# Patient Record
Sex: Female | Born: 1960 | Race: Asian | Hispanic: No | Smoking: Never smoker
Health system: Southern US, Community
[De-identification: ages and names within clinical notes are randomized; demographics above are authoritative.]

## PROBLEM LIST (undated history)

## (undated) DIAGNOSIS — A879 Viral meningitis, unspecified: Secondary | ICD-10-CM

## (undated) DIAGNOSIS — M810 Age-related osteoporosis without current pathological fracture: Secondary | ICD-10-CM

## (undated) DIAGNOSIS — M1990 Unspecified osteoarthritis, unspecified site: Secondary | ICD-10-CM

## (undated) DIAGNOSIS — I10 Essential (primary) hypertension: Secondary | ICD-10-CM

## (undated) DIAGNOSIS — G4733 Obstructive sleep apnea (adult) (pediatric): Secondary | ICD-10-CM

## (undated) DIAGNOSIS — L089 Local infection of the skin and subcutaneous tissue, unspecified: Secondary | ICD-10-CM

## (undated) DIAGNOSIS — G4730 Sleep apnea, unspecified: Secondary | ICD-10-CM

## (undated) DIAGNOSIS — T7840XA Allergy, unspecified, initial encounter: Secondary | ICD-10-CM

## (undated) DIAGNOSIS — H47019 Ischemic optic neuropathy, unspecified eye: Secondary | ICD-10-CM

## (undated) DIAGNOSIS — B019 Varicella without complication: Secondary | ICD-10-CM

## (undated) DIAGNOSIS — E785 Hyperlipidemia, unspecified: Secondary | ICD-10-CM

## (undated) DIAGNOSIS — M5431 Sciatica, right side: Secondary | ICD-10-CM

## (undated) DIAGNOSIS — R011 Cardiac murmur, unspecified: Secondary | ICD-10-CM

## (undated) DIAGNOSIS — I319 Disease of pericardium, unspecified: Secondary | ICD-10-CM

## (undated) DIAGNOSIS — K219 Gastro-esophageal reflux disease without esophagitis: Secondary | ICD-10-CM

## (undated) HISTORY — DX: Viral meningitis, unspecified: A87.9

## (undated) HISTORY — DX: Unspecified osteoarthritis, unspecified site: M19.90

## (undated) HISTORY — DX: Obstructive sleep apnea (adult) (pediatric): G47.33

## (undated) HISTORY — PX: COLONOSCOPY: SHX174

## (undated) HISTORY — PX: POLYPECTOMY: SHX149

## (undated) HISTORY — DX: Hyperlipidemia, unspecified: E78.5

## (undated) HISTORY — DX: Allergy, unspecified, initial encounter: T78.40XA

## (undated) HISTORY — DX: Age-related osteoporosis without current pathological fracture: M81.0

## (undated) HISTORY — DX: Varicella without complication: B01.9

## (undated) HISTORY — DX: Local infection of the skin and subcutaneous tissue, unspecified: L08.9

## (undated) HISTORY — DX: Sleep apnea, unspecified: G47.30

## (undated) HISTORY — DX: Disease of pericardium, unspecified: I31.9

## (undated) HISTORY — DX: Ischemic optic neuropathy, unspecified eye: H47.019

## (undated) HISTORY — DX: Sciatica, right side: M54.31

## (undated) HISTORY — DX: Cardiac murmur, unspecified: R01.1

## (undated) HISTORY — DX: Essential (primary) hypertension: I10

## (undated) HISTORY — DX: Gastro-esophageal reflux disease without esophagitis: K21.9

---

## 2006-06-14 DIAGNOSIS — L089 Local infection of the skin and subcutaneous tissue, unspecified: Secondary | ICD-10-CM

## 2006-06-14 HISTORY — DX: Local infection of the skin and subcutaneous tissue, unspecified: L08.9

## 2007-12-25 ENCOUNTER — Emergency Department (HOSPITAL_COMMUNITY): Admission: EM | Admit: 2007-12-25 | Discharge: 2007-12-25 | Payer: Self-pay | Admitting: Emergency Medicine

## 2008-06-14 HISTORY — PX: KNEE ARTHROSCOPY: SUR90

## 2008-06-25 ENCOUNTER — Other Ambulatory Visit: Admission: RE | Admit: 2008-06-25 | Discharge: 2008-06-25 | Payer: Self-pay | Admitting: Family Medicine

## 2008-11-25 ENCOUNTER — Emergency Department (HOSPITAL_COMMUNITY): Admission: EM | Admit: 2008-11-25 | Discharge: 2008-11-25 | Payer: Self-pay | Admitting: Emergency Medicine

## 2009-02-21 ENCOUNTER — Encounter: Admission: RE | Admit: 2009-02-21 | Discharge: 2009-02-21 | Payer: Self-pay | Admitting: Family Medicine

## 2009-06-25 ENCOUNTER — Emergency Department (HOSPITAL_COMMUNITY): Admission: EM | Admit: 2009-06-25 | Discharge: 2009-06-26 | Payer: Self-pay | Admitting: Emergency Medicine

## 2009-10-21 ENCOUNTER — Encounter: Admission: RE | Admit: 2009-10-21 | Discharge: 2009-10-21 | Payer: Self-pay | Admitting: Obstetrics and Gynecology

## 2010-03-22 ENCOUNTER — Emergency Department (HOSPITAL_COMMUNITY): Admission: EM | Admit: 2010-03-22 | Discharge: 2010-03-22 | Payer: Self-pay | Admitting: Emergency Medicine

## 2010-05-01 ENCOUNTER — Encounter: Admission: RE | Admit: 2010-05-01 | Discharge: 2010-05-01 | Payer: Self-pay | Admitting: Obstetrics and Gynecology

## 2010-06-14 HISTORY — PX: CARDIAC CATHETERIZATION: SHX172

## 2010-07-04 ENCOUNTER — Encounter: Payer: Self-pay | Admitting: Obstetrics and Gynecology

## 2010-08-04 ENCOUNTER — Emergency Department (HOSPITAL_COMMUNITY)
Admission: EM | Admit: 2010-08-04 | Discharge: 2010-08-04 | Disposition: A | Discharge status: Home / Self Care | Payer: 59 | Attending: Emergency Medicine | Admitting: Emergency Medicine

## 2010-08-04 ENCOUNTER — Emergency Department (HOSPITAL_COMMUNITY): Payer: 59

## 2010-08-04 DIAGNOSIS — E785 Hyperlipidemia, unspecified: Secondary | ICD-10-CM | POA: Insufficient documentation

## 2010-08-04 DIAGNOSIS — J45909 Unspecified asthma, uncomplicated: Secondary | ICD-10-CM | POA: Insufficient documentation

## 2010-08-04 DIAGNOSIS — R0789 Other chest pain: Secondary | ICD-10-CM | POA: Insufficient documentation

## 2010-08-04 DIAGNOSIS — I10 Essential (primary) hypertension: Secondary | ICD-10-CM | POA: Insufficient documentation

## 2010-08-04 DIAGNOSIS — M436 Torticollis: Secondary | ICD-10-CM | POA: Insufficient documentation

## 2010-08-04 DIAGNOSIS — R070 Pain in throat: Secondary | ICD-10-CM | POA: Insufficient documentation

## 2010-08-04 DIAGNOSIS — R51 Headache: Secondary | ICD-10-CM | POA: Insufficient documentation

## 2010-08-04 DIAGNOSIS — K089 Disorder of teeth and supporting structures, unspecified: Secondary | ICD-10-CM | POA: Insufficient documentation

## 2010-08-04 DIAGNOSIS — J329 Chronic sinusitis, unspecified: Secondary | ICD-10-CM | POA: Insufficient documentation

## 2010-08-04 DIAGNOSIS — H9209 Otalgia, unspecified ear: Secondary | ICD-10-CM | POA: Insufficient documentation

## 2010-08-04 DIAGNOSIS — E119 Type 2 diabetes mellitus without complications: Secondary | ICD-10-CM | POA: Insufficient documentation

## 2010-08-04 LAB — POCT CARDIAC MARKERS
CKMB, poc: <1 ng/mL — ABNORMAL LOW (ref 1.0–8.0)
Myoglobin, poc: 54.3 ng/mL (ref 12–200)
Troponin i, poc: <0.05 ng/mL (ref 0.00–0.09)

## 2010-08-04 LAB — COMPREHENSIVE METABOLIC PANEL
ALT: 20 U/L (ref 0–35)
AST: 19 U/L (ref 0–37)
Albumin: 4.2 g/dL (ref 3.5–5.2)
Alkaline Phosphatase: 85 U/L (ref 39–117)
BUN: 10 mg/dL (ref 6–23)
CO2: 26 mEq/L (ref 19–32)
Calcium: 10.1 mg/dL (ref 8.4–10.5)
Chloride: 98 mEq/L (ref 96–112)
Creatinine, Ser: 0.67 mg/dL (ref 0.4–1.2)
GFR calc Af Amer: >60 mL/min (ref >60)
GFR calc non Af Amer: >60 mL/min (ref >60)
Glucose, Bld: 149 mg/dL — ABNORMAL HIGH (ref 70–99)
Potassium: 4.1 mEq/L (ref 3.5–5.1)
Sodium: 137 mEq/L (ref 135–145)
Total Bilirubin: 0.7 mg/dL (ref 0.3–1.2)
Total Protein: 8.5 g/dL — ABNORMAL HIGH (ref 6.0–8.3)

## 2010-08-04 LAB — DIFFERENTIAL
Basophils Absolute: 0.1 10*3/uL (ref 0.0–0.1)
Basophils Relative: 1 % (ref 0–1)
Eosinophils Absolute: 0.1 10*3/uL (ref 0.0–0.7)
Eosinophils Relative: 2 % (ref 0–5)
Lymphocytes Relative: 40 % (ref 12–46)
Lymphs Abs: 3.5 10*3/uL (ref 0.7–4.0)
Monocytes Absolute: 0.5 10*3/uL (ref 0.1–1.0)
Monocytes Relative: 6 % (ref 3–12)
Neutro Abs: 4.5 10*3/uL (ref 1.7–7.7)
Neutrophils Relative %: 52 % (ref 43–77)

## 2010-08-04 LAB — CBC
HCT: 43.6 % (ref 36.0–46.0)
Hemoglobin: 15.3 g/dL — ABNORMAL HIGH (ref 12.0–15.0)
MCH: 29.5 pg (ref 26.0–34.0)
MCHC: 35.1 g/dL (ref 30.0–36.0)
MCV: 84 fL (ref 78.0–100.0)
Platelets: 442 10*3/uL — ABNORMAL HIGH (ref 150–400)
RBC: 5.19 MIL/uL — ABNORMAL HIGH (ref 3.87–5.11)
RDW: 12.5 % (ref 11.5–15.5)
WBC: 8.7 10*3/uL (ref 4.0–10.5)

## 2010-08-20 ENCOUNTER — Ambulatory Visit (HOSPITAL_COMMUNITY)
Admission: EM | Admit: 2010-08-20 | Discharge: 2010-08-20 | Disposition: A | Discharge status: Home / Self Care | Payer: 59 | Attending: Interventional Cardiology | Admitting: Interventional Cardiology

## 2010-08-20 DIAGNOSIS — G43909 Migraine, unspecified, not intractable, without status migrainosus: Secondary | ICD-10-CM | POA: Insufficient documentation

## 2010-08-20 DIAGNOSIS — I10 Essential (primary) hypertension: Secondary | ICD-10-CM | POA: Insufficient documentation

## 2010-08-20 DIAGNOSIS — R079 Chest pain, unspecified: Secondary | ICD-10-CM | POA: Insufficient documentation

## 2010-08-20 DIAGNOSIS — M129 Arthropathy, unspecified: Secondary | ICD-10-CM | POA: Insufficient documentation

## 2010-08-20 DIAGNOSIS — E119 Type 2 diabetes mellitus without complications: Secondary | ICD-10-CM | POA: Insufficient documentation

## 2010-08-20 DIAGNOSIS — E785 Hyperlipidemia, unspecified: Secondary | ICD-10-CM | POA: Insufficient documentation

## 2010-08-20 DIAGNOSIS — J45909 Unspecified asthma, uncomplicated: Secondary | ICD-10-CM | POA: Insufficient documentation

## 2010-08-20 LAB — HEPATIC FUNCTION PANEL
ALT: 20 U/L (ref 0–35)
AST: 23 U/L (ref 0–37)
Albumin: 3.5 g/dL (ref 3.5–5.2)
Alkaline Phosphatase: 69 U/L (ref 39–117)
Bilirubin, Direct: 0.3 mg/dL (ref 0.0–0.3)
Indirect Bilirubin: 1 mg/dL — ABNORMAL HIGH (ref 0.3–0.9)
Total Bilirubin: 1.3 mg/dL — ABNORMAL HIGH (ref 0.3–1.2)
Total Protein: 7.1 g/dL (ref 6.0–8.3)

## 2010-08-20 LAB — DIFFERENTIAL
Basophils Absolute: 0.1 10*3/uL (ref 0.0–0.1)
Basophils Relative: 1 % (ref 0–1)
Eosinophils Absolute: 0.2 10*3/uL (ref 0.0–0.7)
Eosinophils Relative: 2 % (ref 0–5)
Lymphocytes Relative: 46 % (ref 12–46)
Lymphs Abs: 4 10*3/uL (ref 0.7–4.0)
Monocytes Absolute: 0.5 10*3/uL (ref 0.1–1.0)
Monocytes Relative: 5 % (ref 3–12)
Neutro Abs: 4 10*3/uL (ref 1.7–7.7)
Neutrophils Relative %: 46 % (ref 43–77)

## 2010-08-20 LAB — BASIC METABOLIC PANEL
BUN: 9 mg/dL (ref 6–23)
CO2: 24 mEq/L (ref 19–32)
Calcium: 9.6 mg/dL (ref 8.4–10.5)
Chloride: 104 mEq/L (ref 96–112)
Creatinine, Ser: 0.54 mg/dL (ref 0.4–1.2)
GFR calc Af Amer: >60 mL/min (ref >60)
GFR calc non Af Amer: >60 mL/min (ref >60)
Glucose, Bld: 162 mg/dL — ABNORMAL HIGH (ref 70–99)
Potassium: 4.4 mEq/L (ref 3.5–5.1)
Sodium: 139 mEq/L (ref 135–145)

## 2010-08-20 LAB — CBC
HCT: 39.4 % (ref 36.0–46.0)
Hemoglobin: 13.4 g/dL (ref 12.0–15.0)
MCH: 28.9 pg (ref 26.0–34.0)
MCHC: 34 g/dL (ref 30.0–36.0)
MCV: 85.1 fL (ref 78.0–100.0)
Platelets: 376 10*3/uL (ref 150–400)
RBC: 4.63 MIL/uL (ref 3.87–5.11)
RDW: 12.6 % (ref 11.5–15.5)
WBC: 8.8 10*3/uL (ref 4.0–10.5)

## 2010-08-20 LAB — CK TOTAL AND CKMB (NOT AT ARMC)
CK, MB: 1.1 ng/mL (ref 0.3–4.0)
Relative Index: INVALID (ref 0.0–2.5)
Total CK: 72 U/L (ref 7–177)

## 2010-08-20 LAB — TROPONIN I: Troponin I: 0.06 ng/mL (ref 0.00–0.06)

## 2010-08-20 LAB — TSH: TSH: 1.249 u[IU]/mL (ref 0.350–4.500)

## 2010-08-20 LAB — GLUCOSE, CAPILLARY: Glucose-Capillary: 104 mg/dL — ABNORMAL HIGH (ref 70–99)

## 2010-08-21 LAB — POCT ACTIVATED CLOTTING TIME: Activated Clotting Time: 175 seconds

## 2010-08-21 NOTE — H&P (Signed)
NAME:  Renee Lam, Renee Lam              ACCOUNT NO.:  [id number]  MEDICAL RECORD NO.:  [id number]           PATIENT TYPE:  I  LOCATION:  6522                         FACILITY:  [facility]  PHYSICIAN:  Armanda Magic, M.D.     DATE OF BIRTH:  [birth year]  DATE OF ADMISSION:  08/20/2010 DATE OF DISCHARGE:  08/20/2010                             HISTORY & PHYSICAL   CHIEF COMPLAINT:  Chest pain.  HISTORY OF PRESENT ILLNESS:  This is a 50 year old female who presented today in our office for a nuclear stress test.  She walked on the treadmill under the standard Bruce protocol for a total of 7 minutes and achieved 10 METS.  She developed chest pain about 6 minutes into the stress test with radiation to the neck and jaw.  EKG during the stress test did not show any significant ST changes, but in recovery she developed downsloping ST-segment with T-wave inversions in the inferior and lateral leads with persistence of her neck pain.  Currently, she is complaining of neck pain that is now moving into her back, between her scapula.  PAST MEDICAL HISTORY:  Type 2 diabetes mellitus, elevated cholesterol, hypertension, asthma, allergic rhinitis, migraines, vitamin D deficiency, and pneumonia in 2011.  CURRENT MEDICATIONS: 1. Ventolin HFA 108 mcg per ACT, aerosol spray, 1 puff every 4 hours     p.r.n. 2. Advair Diskus 250/50 mcg 1 puff p.r.n. 3. Singulair 10 mg in the evening as needed. 4. Lipitor 20 mg daily. 5. EpiPen p.r.n. 6. ReliOn 70/30% suspension, 40 units q.a.m. and 30 units q.p.m.     b.i.d. 7. Metformin 1000 mg b.i.d. 8. Aleve 220 mg as needed.  SURGICAL HISTORY:  She is status post C-section x2.  ALLERGIES:  PNEUMOCOCCAL VACCINE, ZITHROMAX, and PENICILLIN.  FAMILY HISTORY:  Her father is alive with type 2 diabetes mellitus and CAD.  Her mother is alive.  She has one brother with psoriasis and one sister who died of a medical illness.  SOCIAL HISTORY:  She denies any tobacco use.   She denies any alcohol use.  She does drink one serving of caffeine daily.  She denies any IV drug use.  She is divorced.  She has two children.  REVIEW OF SYSTEMS:  Other than what is stated in HPI, is negative.  PHYSICAL EXAMINATION:  GENERAL:  This is a well-developed, well- nourished female in no significant distress, although she has discomfort in her neck and back. HEENT:  Benign. NECK:  Supple without lymphadenopathy.  Carotid upstrokes +2 without any bruits. LUNGS:  Clear to auscultation throughout. HEART:  Regular rate and rhythm.  No murmurs, rubs, or gallops.  Normal S1 and S2. ABDOMEN:  Soft, nontender, nondistended.  Normoactive bowel sounds.  No hepatosplenomegaly. EXTREMITIES:  No cyanosis, erythema, or edema.  ASSESSMENT:  Chest pain after nuclear stress test, now with EKG changes consistent with ischemia and ongoing neck and back pain which she developed during the stress test.  Given the fact that patient continues to have tightness in her chest, it is now moving into her back and scapula with persistent ST changes on EKG, I have discussed  with Dr. Katrinka Blazing who ordered the stress test, and we will plan on transferring the patient to [facility] emergency room.  We will start the patient on IV heparin and nitroglycerin drips.  I have given the patient four baby aspirin in our office, and she will go to the cath lab today per Dr. Verdis Prime.     Armanda Magic, M.D.     TT/MEDQ  D:  08/20/2010  T:  08/21/2010  Job:  [id number]  cc:   Johnn Hai  Electronically Signed by Armanda Magic M.D. on 08/21/2010 13:24:40 PM

## 2010-09-21 LAB — BASIC METABOLIC PANEL
BUN: 8 mg/dL (ref 6–23)
CO2: 25 mEq/L (ref 19–32)
Calcium: 9.4 mg/dL (ref 8.4–10.5)
Chloride: 99 mEq/L (ref 96–112)
Creatinine, Ser: 0.58 mg/dL (ref 0.4–1.2)
GFR calc Af Amer: >60 mL/min (ref >60)
GFR calc non Af Amer: >60 mL/min (ref >60)
Glucose, Bld: 142 mg/dL — ABNORMAL HIGH (ref 70–99)
Potassium: 4.1 mEq/L (ref 3.5–5.1)
Sodium: 137 mEq/L (ref 135–145)

## 2010-09-21 LAB — CBC
HCT: 41 % (ref 36.0–46.0)
Hemoglobin: 13.7 g/dL (ref 12.0–15.0)
MCHC: 33.5 g/dL (ref 30.0–36.0)
MCV: 85 fL (ref 78.0–100.0)
Platelets: 471 10*3/uL — ABNORMAL HIGH (ref 150–400)
RBC: 4.83 MIL/uL (ref 3.87–5.11)
RDW: 13.1 % (ref 11.5–15.5)
WBC: 10 10*3/uL (ref 4.0–10.5)

## 2010-09-21 LAB — MONONUCLEOSIS SCREEN: Mono Screen: NEGATIVE

## 2010-09-21 LAB — DIFFERENTIAL
Basophils Absolute: 0.1 10*3/uL (ref 0.0–0.1)
Basophils Relative: 1 % (ref 0–1)
Eosinophils Absolute: 0.1 10*3/uL (ref 0.0–0.7)
Eosinophils Relative: 1 % (ref 0–5)
Lymphocytes Relative: 30 % (ref 12–46)
Lymphs Abs: 3 10*3/uL (ref 0.7–4.0)
Monocytes Absolute: 0.6 10*3/uL (ref 0.1–1.0)
Monocytes Relative: 6 % (ref 3–12)
Neutro Abs: 6.2 10*3/uL (ref 1.7–7.7)
Neutrophils Relative %: 62 % (ref 43–77)

## 2010-09-21 LAB — URINALYSIS, ROUTINE W REFLEX MICROSCOPIC
Bilirubin Urine: NEGATIVE
Glucose, UA: NEGATIVE mg/dL
Ketones, ur: NEGATIVE mg/dL
Leukocytes, UA: NEGATIVE
Nitrite: NEGATIVE
Protein, ur: NEGATIVE mg/dL
Specific Gravity, Urine: 1.009 (ref 1.005–1.030)
Urobilinogen, UA: 0.2 mg/dL (ref 0.0–1.0)
pH: 6 (ref 5.0–8.0)

## 2010-09-21 LAB — RAPID STREP SCREEN (MED CTR MEBANE ONLY): Streptococcus, Group A Screen (Direct): NEGATIVE

## 2010-09-21 LAB — URINE MICROSCOPIC-ADD ON

## 2010-10-02 ENCOUNTER — Other Ambulatory Visit: Payer: Self-pay | Admitting: Obstetrics and Gynecology

## 2010-10-02 DIAGNOSIS — Z09 Encounter for follow-up examination after completed treatment for conditions other than malignant neoplasm: Secondary | ICD-10-CM

## 2010-10-02 NOTE — Cardiovascular Report (Signed)
NAME:  Renee Lam, DISLA NO.:  [id number]  MEDICAL RECORD NO.:  [id number]           PATIENT TYPE:  I  LOCATION:  6522                         FACILITY:  [facility]  PHYSICIAN:  Lyn Records, M.D.   DATE OF BIRTH:  05-07-1961  DATE OF PROCEDURE:  08/20/2010 DATE OF DISCHARGE:  08/20/2010                           CARDIAC CATHETERIZATION   INDICATIONS:  For the past 2 weeks, the patient has experienced intermittent chest discomfort with an exertional component.  After hearing this story, we had the patient set to have a stress nuclear study which was performed today.  She experienced chest tightness and interscapular discomfort with ambulation on the treadmill.  There were no EKG changes of ischemia.  Postexercise, she developed diffuse ST- segment sagging with T-wave inversion.  Dr. Mayford Knife supervised the study and felt that the patient's findings were compatible with progressive angina/unstable angina.  She was sent to the emergency room and coronary angiography was recommended.  Laboratory data was all normal.  The patient had mild persisting discomfort upon arrival in the emergency room.  EKG was nonischemic.  PROCEDURE PERFORMED: 1. Left heart catheterization via right radial approach. 2. Coronary angiography. 3. Left ventriculography by hand injection.  DESCRIPTION:  The patient was given 2 mg of Versed and 100 mcg of fentanyl.  Once she was asleep, a micropuncture set was used to place a 5-French sheath in the right radial artery using the modified Seldinger technique.  No complications were occurred with sheath placement. Verapamil intra-arterially was administered to prevent spasm.  2500 units of heparin was administered.  Prior to heparin administration, the ACT was 178 seconds.  She received a 3500-unit bolus of heparin in the emergency room and was started on an infusion.  The infusion was discontinued prior to sticking the radial.  Left heart  catheterization was then performed with preformed Judkins right and left 5-French catheters.  The left coronary catheter was a 3.5- cm variety.  Left ventriculography was performed using the multipurpose catheter by hand injection.  No complications arose.  The sheath catheter was removed over a guidewire.  The sheath was removed by the clinical staff with good hemostasis and placement of a wristband.  No complications arose.  RESULTS: 1. Hemodynamic data:     a.     Aortic pressure 93/67.     b.     Left ventricular pressure 104/13. 2. Left ventriculography:  Left ventricular cavity size and function     are normal.  The ejection fraction is 65-70%.  No mitral     regurgitation. 3. Coronary angiography.     a.     Left main coronary artery:  The left main is short and is      widely patent.     b.     Left anterior descending coronary artery:  Large vessel that      is transapical and is widely patent.  Three diagonal branches are      widely patent.     c.     Circumflex artery:  The circumflex is dominant.  There is  deep intubation of the proximal circumflex with the diagnostic      catheter.  Care was taken to avoid vigorous injections to prevent      dissection.  Five obtuse marginal branches and the PDA arise from      the circumflex.  All are entirely normal.     d.     Right coronary artery:  The right coronary is nondominant.      The vessel is widely patent.  CONCLUSION: 1. Normal epicardial coronary arteries. 2. Normal left ventricular function. 3. False positive electrocardiographic response to exercise. 4. Chest pain felt to be noncardiac in origin.  PLAN:  Discharge later today.  I will check a D-dimer.  If she continues to complain, we may consider doing a pulmonary CT angio to rule out pulmonary emboli.     Lyn Records, M.D.     HWS/MEDQ  D:  08/20/2010  T:  08/21/2010  Job:  [id number]  cc:   Emeterio Reeve, MD  Electronically Signed by Verdis Prime M.D. on 10/02/2010 04:51:39 PM

## 2010-10-02 NOTE — Discharge Summary (Signed)
NAME:  Renee Lam, Renee Lam NO.:  [id number]  MEDICAL RECORD NO.:  [id number]           PATIENT TYPE:  I  LOCATION:  6522                         FACILITY:  [facility]  PHYSICIAN:  Lyn Records, M.D.   DATE OF BIRTH:  08-06-1960  DATE OF ADMISSION:  08/20/2010 DATE OF DISCHARGE:  08/20/2010                              DISCHARGE SUMMARY   REASON FOR ADMISSION:  Chest pain.  DISCHARGE DIAGNOSES: 1. Chest pain felt most likely to be due to musculoskeletal pain.     There is certainly no evidence of obstructive coronary disease. 2. Diabetes mellitus. 3. Hypertension. 4. Hyperlipidemia.  PROCEDURES PERFORMED:  Coronary angiography via right radial approach, Dr. Verdis Prime.  PLANS:  The patient will be discharged from the hospital on the medical regimen that she was admitted on which includes Ventolin HFA 108 mcg per puff every 4 hours as needed, Advair Diskus 250/50 mcg 1 puff daily, Singulair 10 mg each evening, Lipitor 20 mg daily, ReliOn insulin 70/30 40 units each a.m., 30 units each p.m., metformin 1000 mg twice daily, Aleve 220 mg daily as needed.  ACTIVITY:  Unrestricted.  FOLLOWUP:  Dr. Mila Palmer.  Should see Dr. Verdis Prime, if cath site related problems.  RESTRICTIONS:  Should not return to work until August 22, 2010.  HISTORY AND PHYSICAL AND HOSPITAL COURSE:  The patient has had a 2-week history of an upper respiratory illness.  During this time, she has experienced chest fullness and neck discomfort with radiation to her jaw.  She was seen in Cardiology consultation earlier this week and a nuclear study was ordered.  She did exercise testing today at our office which resulted in admission to the hospital.  She experienced chest discomfort and jaw discomfort while on the treadmill.  No ischemic EKG changes were noted.  Post recovery, there was diffuse T-wave inversion and ST-segment depression with progression of discomfort into the scapular  area.  Dr. Mayford Knife, who was a supervising physician in the office sent her to the emergency room and felt that she was having unstable angina and should have urgent catheterization because of ongoing pain.  The patient has no prior history of heart disease.  She has had episodes of chest pain requiring hospitalization on three prior occasions in 2006, 2007, and 2008.  She has previously refused cath.  She does not smoke.  Heart cath was performed urgently and was normal.  No coronary atherosclerosis was identified.  The patient had laboratory data done on admission and this included a hemoglobin of 13.4, white blood cell count of 8800, potassium of 4.4, creatinine of 0.54.  CK-MB and troponin were normal.  Liver enzymes were normal.  The direct bilirubin was 1.3 and indirect bilirubin 1.0.  EKGs were nonischemic on admission.  Chest x-ray was recently performed at [facility] on August 04, 2010, and did not reveal acute abnormality.  Upon discharge, the patient was in improved condition.     Lyn Records, M.D.  HWS/MEDQ  D:  08/20/2010  T:  08/21/2010  Job:  [id number]  cc:   Emeterio Reeve, MD  Electronically  Signed by Verdis Prime M.D. on 10/02/2010 04:51:45 PM

## 2010-10-27 ENCOUNTER — Ambulatory Visit
Admission: RE | Admit: 2010-10-27 | Discharge: 2010-10-27 | Disposition: A | Discharge status: Home / Self Care | Payer: 59 | Source: Ambulatory Visit | Attending: Obstetrics and Gynecology | Admitting: Obstetrics and Gynecology

## 2010-10-27 DIAGNOSIS — Z09 Encounter for follow-up examination after completed treatment for conditions other than malignant neoplasm: Secondary | ICD-10-CM

## 2011-04-19 ENCOUNTER — Emergency Department (HOSPITAL_COMMUNITY)
Admission: EM | Admit: 2011-04-19 | Discharge: 2011-04-19 | Disposition: A | Discharge status: Home / Self Care | Payer: 59 | Attending: Emergency Medicine | Admitting: Emergency Medicine

## 2011-04-19 ENCOUNTER — Encounter: Payer: Self-pay | Admitting: *Deleted

## 2011-04-19 DIAGNOSIS — R05 Cough: Secondary | ICD-10-CM | POA: Insufficient documentation

## 2011-04-19 DIAGNOSIS — E119 Type 2 diabetes mellitus without complications: Secondary | ICD-10-CM | POA: Insufficient documentation

## 2011-04-19 DIAGNOSIS — R059 Cough, unspecified: Secondary | ICD-10-CM | POA: Insufficient documentation

## 2011-04-19 DIAGNOSIS — R0789 Other chest pain: Secondary | ICD-10-CM | POA: Insufficient documentation

## 2011-04-19 DIAGNOSIS — J45901 Unspecified asthma with (acute) exacerbation: Secondary | ICD-10-CM

## 2011-04-19 MED ORDER — ALBUTEROL SULFATE HFA 108 (90 BASE) MCG/ACT IN AERS
2.0000 | INHALATION_SPRAY | Freq: Once | RESPIRATORY_TRACT | Status: AC
  Administered 2011-04-19: 2 via RESPIRATORY_TRACT
  Filled 2011-04-19: qty 6.7

## 2011-04-19 MED ORDER — ALBUTEROL SULFATE HFA 108 (90 BASE) MCG/ACT IN AERS: 2.0000 | INHALATION_SPRAY | RESPIRATORY_TRACT | Status: DC | PRN

## 2011-04-19 MED ORDER — ALBUTEROL SULFATE (5 MG/ML) 0.5% IN NEBU
5.0000 mg | INHALATION_SOLUTION | Freq: Once | RESPIRATORY_TRACT | Status: AC
  Administered 2011-04-19: 2.5 mg via RESPIRATORY_TRACT
  Filled 2011-04-19: qty 0.5

## 2011-04-19 NOTE — ED Provider Notes (Signed)
History     CSN: [id number] Arrival date & time: 04/19/2011  7:34 PM   None     Chief Complaint  Patient presents with  . Asthma    (Consider location/radiation/quality/duration/timing/severity/associated sxs/prior treatment) HPI Comments: Patient here with asthma attack after having left her inhaler at home this evening.  Patient is a 50 y.o. female presenting with asthma. The history is provided by the patient. No language interpreter was used.  Asthma This is a chronic problem. The current episode started today. The problem occurs constantly. Associated symptoms include coughing. Pertinent negatives include no abdominal pain, arthralgias, change in bowel habit, chest pain, congestion, fever, headaches, nausea, neck pain, numbness, rash, sore throat, swollen glands or vomiting. The symptoms are aggravated by nothing. She has tried nothing for the symptoms. The treatment provided no relief.    Past Medical History  Diagnosis Date  . Asthma   . Diabetes mellitus     No past surgical history on file.  No family history on file.  History  Substance Use Topics  . Smoking status: Never Smoker   . Smokeless tobacco: Not on file  . Alcohol Use: No    OB History    Grav Para Term Preterm Abortions TAB SAB Ect Mult Living                  Review of Systems  Constitutional: Negative.  Negative for fever.  HENT: Negative.  Negative for congestion, sore throat and neck pain.   Eyes: Negative.   Respiratory: Positive for cough and chest tightness.   Cardiovascular: Negative.  Negative for chest pain.  Gastrointestinal: Negative.  Negative for nausea, vomiting, abdominal pain and change in bowel habit.  Genitourinary: Negative.   Musculoskeletal: Negative.  Negative for arthralgias.  Skin: Negative for rash.  Neurological: Negative.  Negative for numbness and headaches.  Hematological: Negative.   Psychiatric/Behavioral: Negative.     Allergies  Penicillins and  Zithromax  Home Medications   Current Outpatient Rx  Name Route Sig Dispense Refill  . LIPITOR PO Oral Take by mouth.      Marland Kitchen NOVOLIN 70/30 [place] Subcutaneous Inject into the skin.      Marland Kitchen METFORMIN HCL PO Oral Take by mouth.      Marland Kitchen SINGULAIR PO Oral Take by mouth.        BP 143/95  Pulse 90  Temp(Src) 98 F (36.7 C) (Oral)  Resp 20  SpO2 100%  Physical Exam  Constitutional: She is oriented to person, place, and time. She appears well-developed and well-nourished.  HENT:  Head: Normocephalic and atraumatic.  Right Ear: External ear normal.  Left Ear: External ear normal.  Eyes: Pupils are equal, round, and reactive to light.  Neck: Normal range of motion. Neck supple.  Cardiovascular: Normal rate, regular rhythm and normal heart sounds.   Pulmonary/Chest: Effort normal. Not tachypneic. No respiratory distress. She has no decreased breath sounds. She has wheezes in the right upper field and the right middle field. She has no rales. She exhibits no tenderness.  Abdominal: Soft. There is no tenderness.  Musculoskeletal: Normal range of motion.  Neurological: She is alert and oriented to person, place, and time.  Skin: Skin is warm and dry.  Psychiatric: She has a normal mood and affect. Her behavior is normal. Judgment and thought content normal.    ED Course  Procedures (including critical care time)  Labs Reviewed - No data to display No results found.   Asthma Attack  MDM  Feels better after albuterol inhaler - will give inhaler to go home.        Izola Price [place], [place] 04/19/11 2106

## 2011-04-19 NOTE — ED Notes (Signed)
Pt in c/o asthma attack x20 min, pt with audible wheezing, pt did not have inhailer with her when attack started

## 2011-04-22 NOTE — ED Provider Notes (Signed)
Medical screening examination/treatment/procedure(s) were performed by non-physician practitioner and as supervising physician I was immediately available for consultation/collaboration.   Forbes Cellar, MD 04/22/11 [phone]

## 2011-06-18 ENCOUNTER — Ambulatory Visit (INDEPENDENT_AMBULATORY_CARE_PROVIDER_SITE_OTHER): Payer: 59 | Admitting: Internal Medicine

## 2011-06-18 ENCOUNTER — Encounter: Payer: Self-pay | Admitting: Internal Medicine

## 2011-06-18 VITALS — BP 130/90 | Temp 97.9°F | Ht 59.25 in | Wt 162.0 lb

## 2011-06-18 DIAGNOSIS — E7800 Pure hypercholesterolemia, unspecified: Secondary | ICD-10-CM

## 2011-06-18 DIAGNOSIS — E119 Type 2 diabetes mellitus without complications: Secondary | ICD-10-CM

## 2011-06-18 DIAGNOSIS — Z0000 Encounter for general adult medical examination without abnormal findings: Secondary | ICD-10-CM

## 2011-06-18 DIAGNOSIS — J45909 Unspecified asthma, uncomplicated: Secondary | ICD-10-CM | POA: Insufficient documentation

## 2011-06-18 MED ORDER — GLUCOSE BLOOD VI STRP: ORAL_STRIP | Status: DC

## 2011-06-18 MED ORDER — "INSULIN SYRINGE-NEEDLE U-100 31G X 5/16"" 1 ML MISC": Status: DC

## 2011-06-18 NOTE — Progress Notes (Signed)
  Subjective:    Patient ID: Renee Lam, female    DOB: 07/10/1960, 51 y.o.   MRN: [id number]  HPI  51 year old patient who is seen today to establish with our practice. She has long-standing diabetes controlled with mixed split insulin as well as metformin therapy. She was noted last year for chest pain and is status post catheterization revealed normal coronary arteries in March of last year. She has a history of dyslipidemia.  Past medical history:   medical problems include diabetes asthma history of gastro-soft reflux disease she has dyslipidemia and a history of migraines. She has a history of viral meningitis in 1982 and pericarditis in 1984. She is a gravida 4 para 2 abortus 3 she had C-sections in 1997 and 2000.  Family history father age 76 history of diabetes dyslipidemia prostate cancer hypertension he has coronary artery disease status post CABG he has also had a pacemaker insertion Mother died 55 history of congestive heart failure also had diabetes and peripheral vascular disease One brother has hypertension One sister who had a history of bipolar disorder complications from Stevens-Johnson syndrome  Social history Works with [employer]; divorced 2 children lifelong nonsmoker    Review of Systems  Constitutional: Negative for fever, appetite change, fatigue and unexpected weight change.  HENT: Negative for hearing loss, ear pain, nosebleeds, congestion, sore throat, mouth sores, trouble swallowing, neck stiffness, dental problem, voice change, sinus pressure and tinnitus.   Eyes: Negative for photophobia, pain, redness and visual disturbance.  Respiratory: Negative for cough, chest tightness and shortness of breath.   Cardiovascular: Negative for chest pain, palpitations and leg swelling.  Gastrointestinal: Negative for nausea, vomiting, abdominal pain, diarrhea, constipation, blood in stool, abdominal distention and rectal pain.  Genitourinary: Negative for dysuria,  urgency, frequency, hematuria, flank pain, vaginal bleeding, vaginal discharge, difficulty urinating, genital sores, vaginal pain, menstrual problem and pelvic pain.  Musculoskeletal: Negative for back pain and arthralgias.  Skin: Negative for rash.  Neurological: Negative for dizziness, syncope, speech difficulty, weakness, light-headedness, numbness and headaches.  Hematological: Negative for adenopathy. Does not bruise/bleed easily.  Psychiatric/Behavioral: Negative for suicidal ideas, behavioral problems, self-injury, dysphoric mood and agitation. The patient is not nervous/anxious.        Objective:   Physical Exam  Constitutional: She is oriented to person, place, and time. She appears well-developed and well-nourished.  HENT:  Head: Normocephalic.  Right Ear: External ear normal.  Left Ear: External ear normal.  Mouth/Throat: Oropharynx is clear and moist.  Eyes: Conjunctivae and EOM are normal. Pupils are equal, round, and reactive to light.  Neck: Normal range of motion. Neck supple. No thyromegaly present.  Cardiovascular: Normal rate, regular rhythm, normal heart sounds and intact distal pulses.   Pulmonary/Chest: Effort normal and breath sounds normal.  Abdominal: Soft. Bowel sounds are normal. She exhibits no mass. There is no tenderness.  Musculoskeletal: Normal range of motion.  Lymphadenopathy:    She has no cervical adenopathy.  Neurological: She is alert and oriented to person, place, and time.  Skin: Skin is warm and dry. No rash noted.  Psychiatric: She has a normal mood and affect. Her behavior is normal.          Assessment & Plan:   Preventive health exam Diabetes mellitus. We'll check a hemoglobin A1c Dyslipidemia Asthma  Medical regimen unchanged. We'll perform a screening lab at [facility]  Return in 3 months

## 2011-06-18 NOTE — Patient Instructions (Addendum)
Limit your sodium (Salt) intake    It is important that you exercise regularly, at least 20 minutes 3 to 4 times per week.  If you develop chest pain or shortness of breath seek  medical attention.   Please check your hemoglobin A1c every 3 months  Schedule your colonoscopy to help detect colon cancer.  Please see your eye doctor yearly to check for diabetic eye damage

## 2011-06-23 ENCOUNTER — Telehealth: Payer: Self-pay | Admitting: Internal Medicine

## 2011-06-23 NOTE — Telephone Encounter (Signed)
Pt was seen on 06-18-2011 and would like to have all her her current meds sent to [facility]. Please include Id# [id number] on each rx. Thanks.

## 2011-06-24 MED ORDER — NAPROXEN SODIUM 220 MG PO TABS: 220.0000 mg | ORAL_TABLET | Freq: Every day | ORAL | Status: DC | PRN

## 2011-06-24 MED ORDER — INSULIN ASPART PROT & ASPART (70-30 MIX) 100 UNIT/ML ~~LOC~~ SUSP: SUBCUTANEOUS | Status: DC

## 2011-06-24 MED ORDER — METFORMIN HCL 1000 MG PO TABS: 1000.0000 mg | ORAL_TABLET | Freq: Two times a day (BID) | ORAL | Status: DC

## 2011-06-24 MED ORDER — SIMVASTATIN 20 MG PO TABS: 20.0000 mg | ORAL_TABLET | Freq: Every evening | ORAL | Status: DC

## 2011-06-24 MED ORDER — ALBUTEROL SULFATE HFA 108 (90 BASE) MCG/ACT IN AERS: 2.0000 | INHALATION_SPRAY | RESPIRATORY_TRACT | Status: DC | PRN

## 2011-06-24 NOTE — Telephone Encounter (Signed)
done

## 2011-06-25 ENCOUNTER — Telehealth: Payer: Self-pay | Admitting: Internal Medicine

## 2011-06-25 MED ORDER — "INSULIN SYRINGE-NEEDLE U-100 31G X 5/16"" 1 ML MISC": Status: DC

## 2011-06-25 MED ORDER — ALBUTEROL SULFATE HFA 108 (90 BASE) MCG/ACT IN AERS: 2.0000 | INHALATION_SPRAY | RESPIRATORY_TRACT | Status: DC | PRN

## 2011-06-25 MED ORDER — INSULIN ASPART PROT & ASPART (70-30 MIX) 100 UNIT/ML ~~LOC~~ SUSP: SUBCUTANEOUS | Status: DC

## 2011-06-25 MED ORDER — METFORMIN HCL 1000 MG PO TABS: 1000.0000 mg | ORAL_TABLET | Freq: Two times a day (BID) | ORAL | Status: DC

## 2011-06-25 MED ORDER — GLUCOSE BLOOD VI STRP: ORAL_STRIP | Status: DC

## 2011-06-25 MED ORDER — SIMVASTATIN 20 MG PO TABS: 20.0000 mg | ORAL_TABLET | Freq: Every evening | ORAL | Status: DC

## 2011-06-25 MED ORDER — FUROSEMIDE 20 MG PO TABS: 20.0000 mg | ORAL_TABLET | Freq: Every day | ORAL | Status: DC | PRN

## 2011-06-25 MED ORDER — NAPROXEN SODIUM 220 MG PO TABS: 220.0000 mg | ORAL_TABLET | Freq: Every day | ORAL | Status: DC | PRN

## 2011-06-25 NOTE — Telephone Encounter (Signed)
Pt called and said that according to [facility] none of the meds have been sent rcvd . Also pt needs Lasix sent to [facility] as well.

## 2011-06-25 NOTE — Telephone Encounter (Signed)
E RF all meds;  Furosemide 20 mg  #30 OK  To take prn edema

## 2011-06-25 NOTE — Telephone Encounter (Signed)
All meds re filed - and lasix added to med list

## 2011-06-25 NOTE — Telephone Encounter (Signed)
Please advise  - I do not see on past or current med list

## 2011-06-25 NOTE — Telephone Encounter (Signed)
Please refill all medications;  furosemide 20 mg #30 one daily when necessary edema

## 2011-07-02 ENCOUNTER — Other Ambulatory Visit: Payer: Self-pay

## 2011-07-02 MED ORDER — ONETOUCH DELICA LANCETS MISC: 1.0000 | Freq: Every day | Status: DC

## 2011-07-08 ENCOUNTER — Telehealth: Payer: Self-pay | Admitting: Internal Medicine

## 2011-07-08 NOTE — Telephone Encounter (Signed)
Requesting lab results. Thanks. °

## 2011-07-09 NOTE — Telephone Encounter (Signed)
Pt states she had lab work done at [facility] last Friday.  The number to [facility] is [phone].

## 2011-07-12 NOTE — Telephone Encounter (Signed)
Dr. Amador Cunas have you received labs?

## 2011-07-12 NOTE — Telephone Encounter (Signed)
Please call [facility]  to obtain results;  results are not available in chart reviewed

## 2011-07-12 NOTE — Telephone Encounter (Signed)
Pt is call back requesting bloodwork results

## 2011-07-13 ENCOUNTER — Ambulatory Visit (INDEPENDENT_AMBULATORY_CARE_PROVIDER_SITE_OTHER): Payer: 59 | Admitting: Ophthalmology

## 2011-07-13 DIAGNOSIS — E1139 Type 2 diabetes mellitus with other diabetic ophthalmic complication: Secondary | ICD-10-CM

## 2011-07-13 DIAGNOSIS — H43819 Vitreous degeneration, unspecified eye: Secondary | ICD-10-CM

## 2011-07-13 DIAGNOSIS — E11319 Type 2 diabetes mellitus with unspecified diabetic retinopathy without macular edema: Secondary | ICD-10-CM

## 2011-07-13 DIAGNOSIS — E1165 Type 2 diabetes mellitus with hyperglycemia: Secondary | ICD-10-CM

## 2011-07-13 DIAGNOSIS — H2510 Age-related nuclear cataract, unspecified eye: Secondary | ICD-10-CM

## 2011-07-14 NOTE — Telephone Encounter (Signed)
Labs received waiting on result note from Dr. Amador Cunas.

## 2011-07-16 ENCOUNTER — Encounter: Payer: Self-pay | Admitting: Internal Medicine

## 2011-07-16 ENCOUNTER — Ambulatory Visit (INDEPENDENT_AMBULATORY_CARE_PROVIDER_SITE_OTHER): Payer: 59 | Admitting: Internal Medicine

## 2011-07-16 DIAGNOSIS — E119 Type 2 diabetes mellitus without complications: Secondary | ICD-10-CM

## 2011-07-16 DIAGNOSIS — E7800 Pure hypercholesterolemia, unspecified: Secondary | ICD-10-CM

## 2011-07-16 DIAGNOSIS — J45909 Unspecified asthma, uncomplicated: Secondary | ICD-10-CM

## 2011-07-16 MED ORDER — GLUCOSE BLOOD VI STRP: ORAL_STRIP | Status: DC

## 2011-07-16 MED ORDER — INSULIN DETEMIR 100 UNIT/ML ~~LOC~~ SOLN: 50.0000 [IU] | Freq: Every day | SUBCUTANEOUS | Status: DC

## 2011-07-16 MED ORDER — ATORVASTATIN CALCIUM 40 MG PO TABS: 40.0000 mg | ORAL_TABLET | Freq: Every day | ORAL | Status: DC

## 2011-07-16 MED ORDER — INSULIN ASPART 100 UNIT/ML ~~LOC~~ SOLN: SUBCUTANEOUS | Status: DC

## 2011-07-16 NOTE — Patient Instructions (Addendum)
Return office visit one month  Nutritional referral as discussed    It is important that you exercise regularly, at least 20 minutes 3 to 4 times per week.  If you develop chest pain or shortness of breath seek  medical attention.

## 2011-07-16 NOTE — Progress Notes (Signed)
  Subjective:    Patient ID: Renee Lam, female    DOB: August 05, 1960, 51 y.o.   MRN: [id number]  HPI  51 year old patient who is seen today in followup. She establish with our practice last month and previously had been followed by endocrinology (Dr Sharl Ma). Apparently she has not seen that her diabetic specialist since this summer. She recently obtained blood work that revealed a hemoglobin A1c of 11.1. She has dyslipidemia and cholesterol was also elevated at 262. She states blood sugars in the morning are consistently 1:30 with very little variation. She has not dated glucometer and the results of home blood sugar monitoring questionable. She generally feels well. She has been compliant with her medications at least for the past 3 months she is on 7030 insulin 60 units in the morning and 40 units in the evening.    Review of Systems  Constitutional: Negative.   HENT: Negative for hearing loss, congestion, sore throat, rhinorrhea, dental problem, sinus pressure and tinnitus.   Eyes: Negative for pain, discharge and visual disturbance.  Respiratory: Negative for cough and shortness of breath.   Cardiovascular: Negative for chest pain, palpitations and leg swelling.  Gastrointestinal: Negative for nausea, vomiting, abdominal pain, diarrhea, constipation, blood in stool and abdominal distention.  Genitourinary: Negative for dysuria, urgency, frequency, hematuria, flank pain, vaginal bleeding, vaginal discharge, difficulty urinating, vaginal pain and pelvic pain.  Musculoskeletal: Negative for joint swelling, arthralgias and gait problem.  Skin: Negative for rash.  Neurological: Negative for dizziness, syncope, speech difficulty, weakness, numbness and headaches.  Hematological: Negative for adenopathy.  Psychiatric/Behavioral: Negative for behavioral problems, dysphoric mood and agitation. The patient is not nervous/anxious.        Objective:   Physical Exam  Constitutional: She is  oriented to person, place, and time. She appears well-developed and well-nourished.  HENT:  Head: Normocephalic.  Right Ear: External ear normal.  Left Ear: External ear normal.  Mouth/Throat: Oropharynx is clear and moist.  Eyes: Conjunctivae and EOM are normal. Pupils are equal, round, and reactive to light.       Recent eye exam  Neck: Normal range of motion. Neck supple. No thyromegaly present.  Cardiovascular: Normal rate, regular rhythm, normal heart sounds and intact distal pulses.   Pulmonary/Chest: Effort normal and breath sounds normal.  Abdominal: Soft. Bowel sounds are normal. She exhibits no mass. There is no tenderness.  Musculoskeletal: Normal range of motion.  Lymphadenopathy:    She has no cervical adenopathy.  Neurological: She is alert and oriented to person, place, and time.  Skin: Skin is warm and dry. No rash noted.  Psychiatric: She has a normal mood and affect. Her behavior is normal.          Assessment & Plan:

## 2011-07-16 NOTE — Telephone Encounter (Signed)
Pt aware of  appt today to discuss lab work.

## 2011-07-19 ENCOUNTER — Telehealth: Payer: Self-pay | Admitting: Internal Medicine

## 2011-07-19 NOTE — Telephone Encounter (Signed)
Pt aware.

## 2011-07-19 NOTE — Telephone Encounter (Signed)
Asked patient to decrease bedtime  Levemir to 30 units and to decrease mealtime insulin to 8 units;  add an additional 4 units prior to meals if blood sugar is greater than 200

## 2011-07-19 NOTE — Telephone Encounter (Signed)
Pt states she was in on last Friday to discuss her HgA1c results and insulin and levamir were added to her medications.  Pt states after lunch on Saturday her blood sugar bottomed out at 48 then it was 55.  After dinner pt states she drunk some orange juice and her blood sugar went from 50 to 94.  Pt states she woke up during the night feeling strange and her blood sugar was 74 at 2 am.  This morning pt's blood sugar was 110.  Pt would like to know what she needs to do. Pls advise.

## 2011-07-21 ENCOUNTER — Ambulatory Visit: Payer: 59 | Admitting: Internal Medicine

## 2011-07-23 ENCOUNTER — Encounter: Payer: Self-pay | Admitting: Internal Medicine

## 2011-07-26 ENCOUNTER — Telehealth: Payer: Self-pay | Admitting: Internal Medicine

## 2011-07-26 NOTE — Telephone Encounter (Signed)
Patient is requesting samples of levimir(pen) and novolog(pen) and test strips for her glucometer until her mail order rxs are in at the end of the week. Please assist and inform patient of advise.

## 2011-07-26 NOTE — Telephone Encounter (Signed)
Spoke with pt - informed no test strips , but i have 1 pen of each - will come by and pick up later today

## 2011-07-28 ENCOUNTER — Encounter: Payer: Self-pay | Admitting: Family Medicine

## 2011-07-28 ENCOUNTER — Ambulatory Visit (INDEPENDENT_AMBULATORY_CARE_PROVIDER_SITE_OTHER): Payer: 59 | Admitting: Family Medicine

## 2011-07-28 VITALS — BP 110/68 | HR 97 | Temp 98.4°F | Wt 170.0 lb

## 2011-07-28 DIAGNOSIS — E1169 Type 2 diabetes mellitus with other specified complication: Secondary | ICD-10-CM

## 2011-07-28 DIAGNOSIS — E11649 Type 2 diabetes mellitus with hypoglycemia without coma: Secondary | ICD-10-CM

## 2011-07-28 MED ORDER — INSULIN DETEMIR 100 UNIT/ML ~~LOC~~ SOLN: 20.0000 [IU] | Freq: Every day | SUBCUTANEOUS | Status: DC

## 2011-07-28 NOTE — Progress Notes (Signed)
  Subjective:    Patient ID: Renee Lam, female    DOB: [birth year], 51 y.o.   MRN: [id number]  HPI Here for hypoglycemic spells the past 2 days. Yesterday morning and this morning she woke up feeling fine with fasting glucoses of 120-130. Then she ate a light breakfast and took the 8 units of regular Novolog. Both mornings she then dropped to the 40s or 50s, causing her to get shaky, weak,sweaty, and nauseated. She then ate a snack and felt better. After that her glucose went up to 140 yesterday and 135 today. As we speak she feels slightly shaky but better. Her random glucose now after lunch is 128. She did not take any regular insulin with lunch today. She remains on Novolog 70/30 mix at 60 units in the AM and 40 units in the PM. Dr. Amador Cunas had started her on Levemir, taking 50 units at bedtime, and then this was reduced to 30 units. He also started her on regular Novolog at 15 units tid with meals, but then decreased this to 8 units.    Review of Systems  Respiratory: Negative.   Cardiovascular: Negative.   Neurological: Positive for weakness.       Objective:   Physical Exam  Constitutional: She is oriented to person, place, and time. She appears well-developed and well-nourished. No distress.  Neck: No thyromegaly present.  Cardiovascular: Normal rate, regular rhythm, normal heart sounds and intact distal pulses.   Pulmonary/Chest: Effort normal and breath sounds normal.  Neurological: She is alert and oriented to person, place, and time.          Assessment & Plan:  She is having hypoglycemic reactions to doses of regular mealtime insulin, so for now I asked her to stop all regular insulin. She will decrease the Levemir dose to 20 units and keep the Novolog Mix the same. Follow up with Dr. Amador Cunas next week.

## 2011-08-02 ENCOUNTER — Encounter: Payer: Self-pay | Admitting: Internal Medicine

## 2011-08-03 ENCOUNTER — Ambulatory Visit: Payer: 59 | Admitting: Internal Medicine

## 2011-08-03 ENCOUNTER — Other Ambulatory Visit: Payer: Self-pay

## 2011-08-03 MED ORDER — INSULIN PEN NEEDLE 32G X 4 MM MISC: 1.0000 | Freq: Three times a day (TID) | Status: DC

## 2011-08-04 ENCOUNTER — Ambulatory Visit: Payer: 59 | Admitting: *Deleted

## 2011-08-05 ENCOUNTER — Ambulatory Visit: Payer: 59 | Admitting: Internal Medicine

## 2011-08-05 ENCOUNTER — Telehealth: Payer: Self-pay | Admitting: Family Medicine

## 2011-08-05 NOTE — Telephone Encounter (Signed)
[address] [place], [place] [address] p. [phone] f. [phone] To: [facility] Fax: [phone] From: [facility] Date/ Time: 08/04/2011 5:20 PM Taken By: Mercer Pod, CSR Caller: Jamesetta So Facility: not collected Patient: Renee Lam, Renee Lam DOB: 1961/02/14 Phone: [phone] Reason for Call: See info below Regarding Appointment: Yes Appt Date: 08/05/2011 Appt Time: 4:15:00 AM Provider: [name] Reason: Details: has a work related appointment that she cannot cancel Outcome: Instructed patient to call back on the next business day.

## 2011-08-06 ENCOUNTER — Ambulatory Visit (INDEPENDENT_AMBULATORY_CARE_PROVIDER_SITE_OTHER): Payer: 59 | Admitting: Internal Medicine

## 2011-08-06 ENCOUNTER — Telehealth: Payer: Self-pay | Admitting: Internal Medicine

## 2011-08-06 ENCOUNTER — Encounter: Payer: Self-pay | Admitting: Internal Medicine

## 2011-08-06 DIAGNOSIS — E119 Type 2 diabetes mellitus without complications: Secondary | ICD-10-CM

## 2011-08-06 MED ORDER — INSULIN ASPART 100 UNIT/ML ~~LOC~~ SOLN: SUBCUTANEOUS | Status: DC

## 2011-08-06 NOTE — Progress Notes (Signed)
  Subjective:    Patient ID: Renee Lam, female    DOB: 08/16/1960, 51 y.o.   MRN: [id number]  HPI  51 year old patient who is seen today for followup of her diabetes. She was seen approximately one month ago and at that time was on 60 units of 70/30 units in the morning and 40 units of 70/30 units in the afternoon. Hemoglobin A1c was 11.1. It is elected to start Levemir at a dose of 30 units at bedtime and also mealtime insulin sliding scale 15 units prior to each meal an additional 5 units if blood sugar is in excess of 200. However she misunderstood these instructions and continued on the 70/30 insulin as well. She  was seen due to hypoglycemia and her insulin down titrated. At the present time she is on 70/30 units only do 2 lack of availability of needles for her other insulin.      Review of Systems     Objective:   Physical Exam  Constitutional: She appears well-developed and well-nourished. No distress.          Assessment & Plan:   Diabetes mellitus. Detailed written instructions for insulin management dispensed and discussed at length. She'll return in one month for followup but will call if she has any questions  FMLA forms completed

## 2011-08-06 NOTE — Patient Instructions (Signed)
Return in one month for follow-up  Discontinue 70/30 insulin  Titrate Levemir by 4 units every 4 days until blood sugar is less than 150 consistently prior to breakfast

## 2011-08-06 NOTE — Telephone Encounter (Signed)
Pt called re: FMLA paperwork. Pt said that because this paperwork wasn't completed in timely manner, that her work has cancelled her req for FMLA. Pt said that there is a chance that she may loss her job. Pt req that the nurse call her back today, prior to 3pm.

## 2011-08-06 NOTE — Telephone Encounter (Signed)
Spoke with pt- had appt on 2/19 that he was planning to discuss meds and care with before filling out [employer] ppwk - she cx'd that apppt - then r/s to 2/21 - which she cx'd again - no r/s schedule at this time - he need to see her before he can complete ppwk - appt made to be seen today. KIK

## 2011-08-13 ENCOUNTER — Ambulatory Visit: Payer: 59 | Admitting: Internal Medicine

## 2011-09-03 ENCOUNTER — Encounter: Payer: Self-pay | Admitting: Internal Medicine

## 2011-09-03 ENCOUNTER — Ambulatory Visit (INDEPENDENT_AMBULATORY_CARE_PROVIDER_SITE_OTHER): Payer: 59 | Admitting: Internal Medicine

## 2011-09-03 DIAGNOSIS — E119 Type 2 diabetes mellitus without complications: Secondary | ICD-10-CM

## 2011-09-03 MED ORDER — INSULIN DETEMIR 100 UNIT/ML ~~LOC~~ SOLN: SUBCUTANEOUS | Status: DC

## 2011-09-03 NOTE — Patient Instructions (Signed)
Please check your hemoglobin A1c every 3 months  Limit your sodium (Salt) intake    It is important that you exercise regularly, at least 20 minutes 3 to 4 times per week.  If you develop chest pain or shortness of breath seek  medical attention.   

## 2011-09-03 NOTE — Progress Notes (Signed)
  Subjective:    Patient ID: Renee Lam, female    DOB: 01-25-1961, 51 y.o.   MRN: [id number]  HPI Wt Readings from Last 3 Encounters:  09/03/11 174 lb (78.926 kg)  08/06/11 176 lb (79.833 kg)  07/28/11 170 lb (77.48 kg)    51 year old patient who is seen today for followup of her insulin requiring diabetes. Her Levemir insulin has been up titrated to 30 units at bedtime. She is using mealtime insulin 15 units prior to each meal. Blood sugars have done quite well and she states over the past several weeks there has been no blood sugars in excess of 200. Blood sugars fasting generally range from 1:30 to 160   Review of Systems  Constitutional: Positive for unexpected weight change.       Objective:   Physical Exam  Constitutional:       Overweight low-normal blood pressure no distress          Assessment & Plan:    Diabetes mellitus improved. Will titrate basal insulin to 34 units. Continue mealtime insulin as this recheck 2 months and check a hemoglobin A1c at that time

## 2011-09-08 ENCOUNTER — Ambulatory Visit: Payer: 59 | Admitting: *Deleted

## 2011-09-17 ENCOUNTER — Ambulatory Visit: Payer: 59 | Admitting: Internal Medicine

## 2011-09-24 ENCOUNTER — Encounter: Payer: Self-pay | Admitting: *Deleted

## 2011-11-04 ENCOUNTER — Ambulatory Visit: Payer: 59 | Admitting: Internal Medicine

## 2012-03-06 ENCOUNTER — Ambulatory Visit: Payer: 59 | Attending: Family Medicine | Admitting: Physical Therapy

## 2012-04-02 ENCOUNTER — Encounter (HOSPITAL_COMMUNITY): Payer: Self-pay | Admitting: *Deleted

## 2012-04-02 ENCOUNTER — Emergency Department (HOSPITAL_COMMUNITY)
Admission: EM | Admit: 2012-04-02 | Discharge: 2012-04-03 | Disposition: A | Discharge status: Home / Self Care | Payer: 59 | Attending: Emergency Medicine | Admitting: Emergency Medicine

## 2012-04-02 DIAGNOSIS — Z79899 Other long term (current) drug therapy: Secondary | ICD-10-CM | POA: Insufficient documentation

## 2012-04-02 DIAGNOSIS — E785 Hyperlipidemia, unspecified: Secondary | ICD-10-CM | POA: Insufficient documentation

## 2012-04-02 DIAGNOSIS — K219 Gastro-esophageal reflux disease without esophagitis: Secondary | ICD-10-CM | POA: Insufficient documentation

## 2012-04-02 DIAGNOSIS — E119 Type 2 diabetes mellitus without complications: Secondary | ICD-10-CM | POA: Insufficient documentation

## 2012-04-02 DIAGNOSIS — H1045 Other chronic allergic conjunctivitis: Secondary | ICD-10-CM | POA: Insufficient documentation

## 2012-04-02 DIAGNOSIS — I10 Essential (primary) hypertension: Secondary | ICD-10-CM | POA: Insufficient documentation

## 2012-04-02 DIAGNOSIS — H1011 Acute atopic conjunctivitis, right eye: Secondary | ICD-10-CM

## 2012-04-02 DIAGNOSIS — Z794 Long term (current) use of insulin: Secondary | ICD-10-CM | POA: Insufficient documentation

## 2012-04-02 DIAGNOSIS — H11429 Conjunctival edema, unspecified eye: Secondary | ICD-10-CM

## 2012-04-02 DIAGNOSIS — M129 Arthropathy, unspecified: Secondary | ICD-10-CM | POA: Insufficient documentation

## 2012-04-02 MED ORDER — TOBRAMYCIN 0.3 % OP SOLN
2.0000 [drp] | OPHTHALMIC | Status: DC
  Administered 2012-04-03: 2 [drp] via OPHTHALMIC
  Filled 2012-04-02: qty 5

## 2012-04-02 NOTE — ED Notes (Signed)
Pt states that the same swelling happened 2 nights in a row last week at night and both times just right eye and then gets better during the day. Pt states she was at [employer] this evening and her right eye started swelling.

## 2012-04-02 NOTE — ED Provider Notes (Signed)
History     CSN: [id number]  Arrival date & time 04/02/12  2101   First MD Initiated Contact with Patient 04/02/12 2201      Chief Complaint  Patient presents with  . Eye swelling     right eye   HPI  History provided by the patient. Patient is a 51 year old female with history of diabetes, hypertension, asthma and seasonal allergies presents with complaints of recurrent right eyelid swelling. Patient reports that the last 2 nights in the evening time she has had itching, swelling and slight burning sensation to her right eye and eyelid area. She also has some increased tearing at that time. Patient has taken Benadryl at night for the symptoms can also purchase some over-the-counter eyedrops. She states that when she awakes in the morning and all day the eye is back to normal. This evening patient was shopping at [employer] and symptoms returned. She does report having itching and rubbing her eye with her hand. She denies having similar symptoms in the past. She denies any changes in medicines recently. She denies any other sick contacts with similar symptoms. Denies any recent travel. She denies any vision loss.    Past Medical History  Diagnosis Date  . Asthma   . Diabetes mellitus   . Arthritis   . Chicken pox   . GERD (gastroesophageal reflux disease)   . Allergy   . Hypertension   . Hyperlipidemia   . Migraine   . Viral meningitis   . Pericarditis   . Finger infection 2008    isolation    Past Surgical History  Procedure Date  . Breast surgery 2002    breast bx  . Cesarean section     1997, 2000    Family History  Problem Relation Age of Onset  . Arthritis Other   . Cancer Other     colon,ovary/uterus,breast,prostate  . Hyperlipidemia Other   . Heart disease Other   . Hypertension Other   . Depression Other     History  Substance Use Topics  . Smoking status: Never Smoker   . Smokeless tobacco: Never Used  . Alcohol Use: No    OB History    Grav  Para Term Preterm Abortions TAB SAB Ect Mult Living                  Review of Systems  Constitutional: Negative for chills.  HENT: Negative for congestion, rhinorrhea and sneezing.   Eyes: Positive for pain, discharge, redness and itching. Negative for photophobia and visual disturbance.  Neurological: Negative for headaches.    Allergies  Penicillins and Zithromax  Home Medications   Current Outpatient Rx  Name Route Sig Dispense Refill  . ALBUTEROL SULFATE HFA 108 (90 BASE) MCG/ACT IN AERS Inhalation Inhale 2 puffs into the lungs every 4 (four) hours as needed. For shortness of breath/wheezing    . ASPIRIN 81 MG PO TABS Oral Take 162 mg by mouth daily.     . ATORVASTATIN CALCIUM 40 MG PO TABS Oral Take 1 tablet (40 mg total) by mouth daily. 90 tablet 3  . EPINEPHRINE 0.3 MG/0.3ML IJ DEVI Intramuscular Inject 0.3 mg into the muscle daily as needed. For anaphylaxis    . FUROSEMIDE 20 MG PO TABS Oral Take 20 mg by mouth daily as needed. For fluid retention    . INSULIN ASPART 100 UNIT/ML [place] SOLN Subcutaneous Inject 15-20 Units into the skin 3 (three) times daily before meals. 15 units prior  to each meal. If blood sugar is greater than 200 and an additional 5 units; Pt on sliding scale    . INSULIN DETEMIR 100 UNIT/ML  SOLN Subcutaneous Inject 35 Units into the skin at bedtime.    Marland Kitchen METFORMIN HCL 1000 MG PO TABS Oral Take 1 tablet (1,000 mg total) by mouth 2 (two) times daily with a meal. 180 tablet 3  . MONTELUKAST SODIUM 10 MG PO TABS Oral Take 10 mg by mouth at bedtime.    Marland Kitchen NAPROXEN SODIUM 220 MG PO TABS Oral Take 220 mg by mouth daily as needed. For pain    . SIMVASTATIN 20 MG PO TABS Oral Take 1 tablet (20 mg total) by mouth every evening. 90 tablet 3  . FREESTYLE INSULINX SYSTEM W/DEVICE KIT Does not apply by Does not apply route.    Marland Kitchen GLUCOSE BLOOD VI STRP Other 1 each by Other route daily. Use as instructed    . GLUCOSE BLOOD VI STRP  Three times a day or as needed for  diabetes 250.00 100 each 1  . INSULIN PEN NEEDLE 32G X 4 MM MISC Does not apply 1 each by Does not apply route 3 (three) times daily. 300 each 3  . INSULIN SYRINGE-NEEDLE U-100 31G X 5/16" 1 ML MISC  3 times a day or as needed 300 each 3  . ONETOUCH DELICA LANCETS MISC Does not apply 1 each by Does not apply route daily. 100 each 6    DX 250.00    BP 131/80  Pulse 74  Temp 97.9 F (36.6 C) (Oral)  Resp 18  SpO2 96%  Physical Exam  Nursing note and vitals reviewed. Constitutional: She is oriented to person, place, and time. She appears well-developed and well-nourished. No distress.  HENT:  Head: Normocephalic.  Eyes: EOM are normal. Pupils are equal, round, and reactive to light. Right eye exhibits chemosis and discharge. No foreign body present in the right eye. Right conjunctiva is injected. Right conjunctiva has no hemorrhage. Left conjunctiva is not injected.  Slit lamp exam:      The right eye shows no corneal abrasion, no corneal flare, no corneal ulcer, no foreign body, no hyphema and no hypopyon.       Diffuse swelling of the upper and lower right eyelids with slight erythema. There is tearing of the right eye injection and chemosis.  Visual acuity: Left eye 20/20, right eye 30/20, both eyes 20/20. With corrective lenses.  Cardiovascular: Normal rate and regular rhythm.   Pulmonary/Chest: Effort normal and breath sounds normal.  Abdominal: Soft.  Neurological: She is alert and oriented to person, place, and time.  Skin: Skin is warm and dry. No rash noted.  Psychiatric: She has a normal mood and affect. Her behavior is normal.    ED Course  Procedures      1. Allergic conjunctivitis, right eye   2. Chemosis of conjunctiva       MDM  10:50 PM patient seen and evaluated. Patient has had recurrent symptoms for the past 2 nights. Patient has significant history of seasonal allergies and asthma. Symptoms are most suspicious for an allergic conjunctivitis with  chemosis.        Angus Seller, [place] 04/03/12 [phone]

## 2012-04-03 ENCOUNTER — Other Ambulatory Visit: Payer: Self-pay | Admitting: Obstetrics and Gynecology

## 2012-04-03 ENCOUNTER — Ambulatory Visit
Admission: RE | Admit: 2012-04-03 | Discharge: 2012-04-03 | Disposition: A | Discharge status: Home / Self Care | Payer: 59 | Source: Ambulatory Visit | Attending: Obstetrics and Gynecology | Admitting: Obstetrics and Gynecology

## 2012-04-03 DIAGNOSIS — Z1231 Encounter for screening mammogram for malignant neoplasm of breast: Secondary | ICD-10-CM

## 2012-04-04 NOTE — ED Provider Notes (Signed)
Medical screening examination/treatment/procedure(s) were performed by non-physician practitioner and as supervising physician I was immediately available for consultation/collaboration.  Cheri Guppy, MD 04/04/12 [phone]

## 2012-04-20 ENCOUNTER — Ambulatory Visit (INDEPENDENT_AMBULATORY_CARE_PROVIDER_SITE_OTHER): Payer: 59 | Admitting: Internal Medicine

## 2012-04-20 ENCOUNTER — Encounter: Payer: Self-pay | Admitting: Internal Medicine

## 2012-04-20 VITALS — BP 100/70 | Temp 98.0°F | Wt 168.0 lb

## 2012-04-20 DIAGNOSIS — J45909 Unspecified asthma, uncomplicated: Secondary | ICD-10-CM

## 2012-04-20 DIAGNOSIS — E7800 Pure hypercholesterolemia, unspecified: Secondary | ICD-10-CM

## 2012-04-20 DIAGNOSIS — E119 Type 2 diabetes mellitus without complications: Secondary | ICD-10-CM

## 2012-04-20 LAB — GLUCOSE, POCT (MANUAL RESULT ENTRY): POC Glucose: 179 mg/dl — AB (ref 70–99)

## 2012-04-20 MED ORDER — METFORMIN HCL ER (OSM) 500 MG PO TB24: 500.0000 mg | ORAL_TABLET | Freq: Every day | ORAL | Status: DC

## 2012-04-20 NOTE — Patient Instructions (Signed)
Limit your sodium (Salt) intake   Please check your hemoglobin A1c every 3 months    It is important that you exercise regularly, at least 20 minutes 3 to 4 times per week.  If you develop chest pain or shortness of breath seek  medical attention.   

## 2012-04-20 NOTE — Progress Notes (Signed)
Subjective:    Patient ID: Renee Lam, female    DOB: Jan 20, 1961, 51 y.o.   MRN: [id number]  HPI  51 year old patient who is seen today for followup of type 2 diabetes dyslipidemia. She also has a history of asthma. She has discontinue metformin due to diarrhea and has up titrated basal insulin to 50 units. She's also using NovoLog 3 times daily prior to meals. In January hemoglobin A1c was over 11 2 months ago at work this was checked and was 8.2. Since that time she is further up titrated insulin. She generally feels well today.  Past Medical History  Diagnosis Date  . Asthma   . Diabetes mellitus   . Arthritis   . Chicken pox   . GERD (gastroesophageal reflux disease)   . Allergy   . Hypertension   . Hyperlipidemia   . Migraine   . Viral meningitis   . Pericarditis   . Finger infection 2008    isolation    History   Social History  . Marital Status: Divorced    Spouse Name: N/A    Number of Children: N/A  . Years of Education: N/A   Occupational History  . Not on file.   Social History Main Topics  . Smoking status: Never Smoker   . Smokeless tobacco: Never Used  . Alcohol Use: No  . Drug Use: No  . Sexually Active: Not on file   Other Topics Concern  . Not on file   Social History Narrative  . No narrative on file    Past Surgical History  Procedure Date  . Breast surgery 2002    breast bx  . Cesarean section     1997, 2000    Family History  Problem Relation Age of Onset  . Arthritis Other   . Cancer Other     colon,ovary/uterus,breast,prostate  . Hyperlipidemia Other   . Heart disease Other   . Hypertension Other   . Depression Other     Allergies  Allergen Reactions  . Penicillins   . Zithromax (Azithromycin)     z-pack    Current Outpatient Prescriptions on File Prior to Visit  Medication Sig Dispense Refill  . albuterol (PROVENTIL HFA;VENTOLIN HFA) 108 (90 BASE) MCG/ACT inhaler Inhale 2 puffs into the lungs every 4 (four)  hours as needed. For shortness of breath/wheezing      . aspirin 81 MG tablet Take 162 mg by mouth daily.       Marland Kitchen atorvastatin (LIPITOR) 40 MG tablet Take 1 tablet (40 mg total) by mouth daily.  90 tablet  3  . Blood Glucose Monitoring Suppl (FREESTYLE INSULINX SYSTEM) W/DEVICE KIT by Does not apply route.      Marland Kitchen EPINEPHrine (EPI-PEN) 0.3 mg/0.3 mL DEVI Inject 0.3 mg into the muscle daily as needed. For anaphylaxis      . furosemide (LASIX) 20 MG tablet Take 20 mg by mouth daily as needed. For fluid retention      . glucose blood (FREESTYLE INSULINX TEST) test strip 1 each by Other route daily. Use as instructed      . insulin aspart (NOVOLOG) 100 UNIT/ML injection Inject 25 Units into the skin 3 (three) times daily before meals.       . insulin detemir (LEVEMIR) 100 UNIT/ML injection Inject 50 Units into the skin at bedtime.       . Insulin Pen Needle 32G X 4 MM MISC 1 each by Does not apply route 3 (three)  times daily.  300 each  3  . Insulin Syringe-Needle U-100 ([place] INSULIN SYRINGE 1ML/31G) 31G X 5/16" 1 ML MISC 3 times a day or as needed  300 each  3  . montelukast (SINGULAIR) 10 MG tablet Take 10 mg by mouth at bedtime.      . naproxen sodium (ANAPROX) 220 MG tablet Take 220 mg by mouth daily as needed. For pain      . ONETOUCH DELICA LANCETS MISC 1 each by Does not apply route daily.  100 each  6  . simvastatin (ZOCOR) 20 MG tablet Take 1 tablet (20 mg total) by mouth every evening.  90 tablet  3  . [DISCONTINUED] albuterol (PROVENTIL HFA;VENTOLIN HFA) 108 (90 BASE) MCG/ACT inhaler Inhale 2 puffs into the lungs every 4 (four) hours as needed for wheezing.  3 Inhaler  3  . [DISCONTINUED] furosemide (LASIX) 20 MG tablet Take 1 tablet (20 mg total) by mouth daily as needed.  30 tablet  3    BP 100/70  Temp 98 F (36.7 C) (Oral)  Wt 168 lb (76.204 kg)      Review of Systems  Constitutional: Negative.   HENT: Negative for hearing loss, congestion, sore throat, rhinorrhea, dental  problem, sinus pressure and tinnitus.   Eyes: Negative for pain, discharge and visual disturbance.  Respiratory: Negative for cough and shortness of breath.   Cardiovascular: Negative for chest pain, palpitations and leg swelling.  Gastrointestinal: Negative for nausea, vomiting, abdominal pain, diarrhea, constipation, blood in stool and abdominal distention.  Genitourinary: Negative for dysuria, urgency, frequency, hematuria, flank pain, vaginal bleeding, vaginal discharge, difficulty urinating, vaginal pain and pelvic pain.  Musculoskeletal: Negative for joint swelling, arthralgias and gait problem.  Skin: Negative for rash.  Neurological: Negative for dizziness, syncope, speech difficulty, weakness, numbness and headaches.  Hematological: Negative for adenopathy.  Psychiatric/Behavioral: Negative for behavioral problems, dysphoric mood and agitation. The patient is not nervous/anxious.        Objective:   Physical Exam  Constitutional: She is oriented to person, place, and time. She appears well-developed and well-nourished.  HENT:  Head: Normocephalic.  Right Ear: External ear normal.  Left Ear: External ear normal.  Mouth/Throat: Oropharynx is clear and moist.  Eyes: Conjunctivae normal and EOM are normal. Pupils are equal, round, and reactive to light.  Neck: Normal range of motion. Neck supple. No thyromegaly present.  Cardiovascular: Normal rate, regular rhythm, normal heart sounds and intact distal pulses.   Pulmonary/Chest: Effort normal and breath sounds normal.  Abdominal: Soft. Bowel sounds are normal. She exhibits no mass. There is no tenderness.  Musculoskeletal: Normal range of motion.  Lymphadenopathy:    She has no cervical adenopathy.  Neurological: She is alert and oriented to person, place, and time.  Skin: Skin is warm and dry. No rash noted.  Psychiatric: She has a normal mood and affect. Her behavior is normal.          Assessment & Plan:   Diabetes-   check a hemoglobin A1c. Since her last hemoglobin A1c of 8.2 Levemir therapy has been up titrated and metformin therapy has been discontinued. Rechallenge at a lower dose of 500 mg metformin extended release once daily in the morning. We'll recheck in 3 months Dyslipidemia stable

## 2012-04-25 ENCOUNTER — Telehealth: Payer: Self-pay | Admitting: Internal Medicine

## 2012-04-25 NOTE — Telephone Encounter (Signed)
Pt called req lab results from 04/20/12. Pls call.

## 2012-04-26 ENCOUNTER — Other Ambulatory Visit: Payer: Self-pay | Admitting: General Practice

## 2012-04-26 DIAGNOSIS — E119 Type 2 diabetes mellitus without complications: Secondary | ICD-10-CM

## 2012-04-26 MED ORDER — GLUCOSE BLOOD VI STRP: ORAL_STRIP | Status: DC

## 2012-04-26 MED ORDER — INSULIN ASPART 100 UNIT/ML ~~LOC~~ SOLN: 30.0000 [IU] | Freq: Three times a day (TID) | SUBCUTANEOUS | Status: DC

## 2012-04-26 MED ORDER — INSULIN PEN NEEDLE 32G X 4 MM MISC: 1.0000 | Freq: Three times a day (TID) | Status: DC

## 2012-04-26 MED ORDER — INSULIN DETEMIR 100 UNIT/ML ~~LOC~~ SOLN: 60.0000 [IU] | Freq: Every day | SUBCUTANEOUS | Status: DC

## 2012-04-26 NOTE — Telephone Encounter (Signed)
Recent Labs show that A1c is at 11.4. Is Pt taking medications? If so,  Pt needs to increase Levemir to 60 and Novolog to 30 before each meal. Pt needs to be seen in 4 weeks to repeat labwork.

## 2012-04-26 NOTE — Telephone Encounter (Signed)
I do not see any results for this labwork in the computer. Laney Potash gave me a printout on 11/12 pertaining to this order placed on your desk 11/13.

## 2012-05-04 ENCOUNTER — Encounter: Payer: Self-pay | Admitting: Internal Medicine

## 2012-06-05 ENCOUNTER — Telehealth: Payer: Self-pay | Admitting: Internal Medicine

## 2012-06-05 NOTE — Telephone Encounter (Signed)
Please advise if okay to order labs and give ICD-9 codes. Thanks

## 2012-06-05 NOTE — Telephone Encounter (Signed)
Patient came in stating that she need an rx to have her labs done at her job to include,a cmp,urine microalbumin, and tsh. Patient also need a written rx for levimir flexpen 100unit ml injection take 60 units daily so that she may take to another pharmacy. Please assist. Please contact patient at [phone]

## 2012-06-06 NOTE — Telephone Encounter (Signed)
All ok

## 2012-06-06 NOTE — Telephone Encounter (Signed)
Pt notified Orders for lab work and Rx are ready for pick up. Pt verbalized understanding.

## 2012-06-06 NOTE — Telephone Encounter (Signed)
Patient would like the previous request fulfilled today please.

## 2012-06-16 ENCOUNTER — Encounter: Payer: Self-pay | Admitting: Internal Medicine

## 2012-06-16 ENCOUNTER — Ambulatory Visit (AMBULATORY_SURGERY_CENTER): Payer: 59 | Admitting: *Deleted

## 2012-06-16 VITALS — Ht <= 58 in | Wt 171.2 lb

## 2012-06-16 DIAGNOSIS — Z1211 Encounter for screening for malignant neoplasm of colon: Secondary | ICD-10-CM

## 2012-06-16 MED ORDER — MOVIPREP 100 G PO SOLR: ORAL | Status: DC

## 2012-06-23 ENCOUNTER — Telehealth: Payer: Self-pay | Admitting: Internal Medicine

## 2012-06-23 NOTE — Telephone Encounter (Signed)
All questions about prep answered 

## 2012-06-27 ENCOUNTER — Ambulatory Visit (AMBULATORY_SURGERY_CENTER): Payer: 59 | Admitting: Internal Medicine

## 2012-06-27 ENCOUNTER — Telehealth: Payer: Self-pay | Admitting: Gastroenterology

## 2012-06-27 ENCOUNTER — Encounter: Payer: Self-pay | Admitting: Internal Medicine

## 2012-06-27 VITALS — BP 134/79 | HR 68 | Temp 96.9°F | Resp 20 | Ht 59.0 in | Wt 171.2 lb

## 2012-06-27 DIAGNOSIS — K5730 Diverticulosis of large intestine without perforation or abscess without bleeding: Secondary | ICD-10-CM

## 2012-06-27 DIAGNOSIS — D126 Benign neoplasm of colon, unspecified: Secondary | ICD-10-CM

## 2012-06-27 DIAGNOSIS — Z1211 Encounter for screening for malignant neoplasm of colon: Secondary | ICD-10-CM

## 2012-06-27 LAB — GLUCOSE, CAPILLARY
Glucose-Capillary: 127 mg/dL — ABNORMAL HIGH (ref 70–99)
Glucose-Capillary: 108 mg/dL — ABNORMAL HIGH (ref 70–99)

## 2012-06-27 MED ORDER — SODIUM CHLORIDE 0.9 % IV SOLN: 500.0000 mL | INTRAVENOUS | Status: DC

## 2012-06-27 NOTE — Progress Notes (Signed)
Lidocaine-40mg IV prior to Propofol InductionPropofol given over incremental dosages 

## 2012-06-27 NOTE — Op Note (Signed)
[facility] [address] [place], [address]   COLONOSCOPY PROCEDURE REPORT  PATIENT: Renee, Lam  MR#: [id number] BIRTHDATE: 02-02-1961 , 51  yrs. old GENDER: Female ENDOSCOPIST: Roxy Cedar, MD REFERRED AO:ZHYQ Dareen Piano, M.D. PROCEDURE DATE:  06/27/2012 PROCEDURE:   Colonoscopy with biopsies Colonoscopy with snare polypectomy  x 1 ASA CLASS:   Class II INDICATIONS:average risk screening. MEDICATIONS: MAC sedation, administered by CRNA and propofol (Diprivan) 200mg  IV  DESCRIPTION OF PROCEDURE:   After the risks benefits and alternatives of the procedure were thoroughly explained, informed consent was obtained.  A digital rectal exam revealed no abnormalities of the rectum.   The LB [id number]  endoscope was introduced through the anus and advanced to the cecum, which was identified by both the appendix and ileocecal valve. No adverse events experienced.   The quality of the prep was excellent, using MoviPrep  The instrument was then slowly withdrawn as the colon was fully examined.      COLON FINDINGS: A pedunculated polyp,19mm in size, was found in the transverse colon.  A polypectomy was performed with a cold snare. The resection was complete and the polyp tissue was completely retrieved.   Moderate diverticulosis was noted The finding was in the left colon. The ICV appearring somewhat irregular. Multiple bx taken.  The colon mucosa was otherwise normal.  Retroflexed views revealed no abnormalities. The time to cecum=2 minutes 57 seconds. Withdrawal time=12 minutes 10 seconds.  The scope was withdrawn and the procedure completed. COMPLICATIONS: There were no complications.  ENDOSCOPIC IMPRESSION: 1.   Pedunculated polyp ranging between 5-5mm in size was found in the transverse colon; polypectomy was performed with a cold snare 2.   Moderate diverticulosis was noted in the left colon 3.   ? irregular ICV.  The colon mucosa was  otherwise normal  RECOMMENDATIONS: 1.  Await biopsy results 2.  Repeat Colonoscopy  to be determined base on pathology results.   eSigned:  Roxy Cedar, MD 06/27/2012 11:58 AM   cc: Malva Limes, MD, Gordy Savers, MD, and The Patient   PATIENT NAME:  Renee, Lam MR#: [id number]

## 2012-06-27 NOTE — Telephone Encounter (Signed)
Complaining of shivering while taking the prep. She vomited the last 3 ounces of the ED prep. I reassured her that these reactions can occur with colonoscopy prep and to continue the prep in the early morning in anticipation of tomorrow's colonoscopy.

## 2012-06-27 NOTE — Patient Instructions (Addendum)
YOU HAD AN ENDOSCOPIC PROCEDURE TODAY AT THE [facility]: Refer to the procedure report that was given to you for any specific questions about what was found during the examination.  If the procedure report does not answer your questions, please call your gastroenterologist to clarify.  If you requested that your care partner not be given the details of your procedure findings, then the procedure report has been included in a sealed envelope for you to review at your convenience later.  YOU SHOULD EXPECT: Some feelings of bloating in the abdomen. Passage of more gas than usual.  Walking can help get rid of the air that was put into your GI tract during the procedure and reduce the bloating. If you had a lower endoscopy (such as a colonoscopy or flexible sigmoidoscopy) you may notice spotting of blood in your stool or on the toilet paper. If you underwent a bowel prep for your procedure, then you may not have a normal bowel movement for a few days.  DIET: Your first meal following the procedure should be a light meal and then it is ok to progress to your normal diet.  A half-sandwich or bowl of soup is an example of a good first meal.  Heavy or fried foods are harder to digest and may make you feel nauseous or bloated.  Likewise meals heavy in dairy and vegetables can cause extra gas to form and this can also increase the bloating.  Drink plenty of fluids but you should avoid alcoholic beverages for 24 hours.  ACTIVITY: Your care partner should take you home directly after the procedure.  You should plan to take it easy, moving slowly for the rest of the day.  You can resume normal activity the day after the procedure however you should NOT DRIVE or use heavy machinery for 24 hours (because of the sedation medicines used during the test).    SYMPTOMS TO REPORT IMMEDIATELY: A gastroenterologist can be reached at any hour.  During normal business hours, 8:30 AM to 5:00 PM Monday through Friday,  call [phone].  After hours and on weekends, please call the GI answering service at [phone] who will take a message and have the physician on call contact you.   Following lower endoscopy (colonoscopy or flexible sigmoidoscopy):  Excessive amounts of blood in the stool  Significant tenderness or worsening of abdominal pains  Swelling of the abdomen that is new, acute  Fever of 100F or higher   FOLLOW UP: If any biopsies were taken you will be contacted by phone or by letter within the next 1-3 weeks.  Call your gastroenterologist if you have not heard about the biopsies in 3 weeks.  Our staff will call the home number listed on your records the next business day following your procedure to check on you and address any questions or concerns that you may have at that time regarding the information given to you following your procedure. This is a courtesy call and so if there is no answer at the home number and we have not heard from you through the emergency physician on call, we will assume that you have returned to your regular daily activities without incident.  SIGNATURES/CONFIDENTIALITY: You and/or your care partner have signed paperwork which will be entered into your electronic medical record.  These signatures attest to the fact that that the information above on your After Visit Summary has been reviewed and is understood.  Full responsibility of the confidentiality of   this discharge information lies with you and/or your care-partner.  Handout on polyps, diverticulosis, high fiber diet    

## 2012-06-27 NOTE — Progress Notes (Signed)
Called to room to assist during endoscopic procedure.  Patient ID and intended procedure confirmed with present staff. Received instructions for my participation in the procedure from the performing physician.  

## 2012-06-27 NOTE — Progress Notes (Signed)
Patient did not experience any of the following events: a burn prior to discharge; a fall within the facility; wrong site/side/patient/procedure/implant event; or a hospital transfer or hospital admission upon discharge from the facility. (G8907) Patient did not have preoperative order for IV antibiotic SSI prophylaxis. (G8918)  

## 2012-06-28 ENCOUNTER — Telehealth: Payer: Self-pay | Admitting: *Deleted

## 2012-06-28 NOTE — Telephone Encounter (Signed)
Pt is at work.  She rates abdominal cramping and bloating as a "5."  No c/o nausea or fever.  Able to eat without difficulties.  I told her to drink warm fluids and ambulate as much as possible to get air out.  Understanding voiced.  Will call later in the a.m. To check on her

## 2012-06-28 NOTE — Telephone Encounter (Signed)
  Follow up Call-  Call back number 06/27/2012  Post procedure Call Back phone  # [phone]  Permission to leave phone message Yes     Patient questions:  Do you have a fever, pain , or abdominal swelling? yes Pain Score  5 *  Have you tolerated food without any problems? yes  Have you been able to return to your normal activities? yes  Do you have any questions about your discharge instructions: Diet   no Medications  no Follow up visit  no  Do you have questions or concerns about your Care? no  Actions: * If pain score is 4 or above: Physician/ provider Notified : Yancey Flemings, MD.  Pain variance form filled out, given to [facility] RN and Dr. Marina Goodell notified of pt's pain this a.m.

## 2012-06-28 NOTE — Telephone Encounter (Signed)
Pt is having no further pain at this time.

## 2012-07-03 ENCOUNTER — Encounter: Payer: Self-pay | Admitting: Internal Medicine

## 2012-07-05 ENCOUNTER — Ambulatory Visit: Payer: 59 | Admitting: Internal Medicine

## 2012-07-13 ENCOUNTER — Ambulatory Visit (INDEPENDENT_AMBULATORY_CARE_PROVIDER_SITE_OTHER): Payer: 59 | Admitting: Ophthalmology

## 2012-07-24 ENCOUNTER — Ambulatory Visit: Payer: 59 | Admitting: Internal Medicine

## 2012-07-25 ENCOUNTER — Ambulatory Visit (INDEPENDENT_AMBULATORY_CARE_PROVIDER_SITE_OTHER): Payer: 59 | Admitting: Ophthalmology

## 2012-07-27 ENCOUNTER — Ambulatory Visit: Payer: 59 | Admitting: Internal Medicine

## 2012-08-25 ENCOUNTER — Ambulatory Visit (INDEPENDENT_AMBULATORY_CARE_PROVIDER_SITE_OTHER): Payer: 59 | Admitting: Family Medicine

## 2012-08-25 ENCOUNTER — Encounter: Payer: Self-pay | Admitting: Family Medicine

## 2012-08-25 VITALS — BP 120/80 | HR 88 | Temp 98.1°F | Wt 174.0 lb

## 2012-08-25 DIAGNOSIS — T148XXA Other injury of unspecified body region, initial encounter: Secondary | ICD-10-CM

## 2012-08-25 DIAGNOSIS — G5600 Carpal tunnel syndrome, unspecified upper limb: Secondary | ICD-10-CM

## 2012-08-25 DIAGNOSIS — G5601 Carpal tunnel syndrome, right upper limb: Secondary | ICD-10-CM

## 2012-08-25 NOTE — Progress Notes (Signed)
Chief Complaint  Patient presents with  . right shoulder pain    started last night     HPI:  R Shoulder pain: - started yesterday  - woke up with it - can't think of inciting vents - pain and numb feeling in R upper shoulder and lower neck, has had some numbness in radial fingers of hand as well but has known carpel tunnel in this hand- has been holding arm in upright posistion -worse with computer work and certain movements of shoulder -hx of diabetes, cholesterol, HTN, migraine, Reflux -denies fevers, chills, weakness in shoulder arm or hand  ROS: See pertinent positives and negatives per HPI.  Past Medical History  Diagnosis Date  . Asthma   . Diabetes mellitus   . Arthritis   . Chicken pox   . GERD (gastroesophageal reflux disease)   . Allergy   . Hypertension   . Hyperlipidemia   . Migraine   . Viral meningitis   . Pericarditis   . Finger infection 2008    isolation    Family History  Problem Relation Age of Onset  . Arthritis Other   . Cancer Other     colon,ovary/uterus,breast,prostate  . Hyperlipidemia Other   . Heart disease Other   . Hypertension Other   . Depression Other   . Colon cancer Neg Hx   . Stomach cancer Neg Hx   . Colon polyps Mother     History   Social History  . Marital Status: Divorced    Spouse Name: N/A    Number of Children: N/A  . Years of Education: N/A   Social History Main Topics  . Smoking status: Never Smoker   . Smokeless tobacco: Never Used  . Alcohol Use: No  . Drug Use: No  . Sexually Active: None   Other Topics Concern  . None   Social History Narrative  . None    Current outpatient prescriptions:aspirin 81 MG tablet, Take 162 mg by mouth daily. , Disp: , Rfl: ;  Blood Glucose Monitoring Suppl (FREESTYLE INSULINX SYSTEM) W/DEVICE KIT, by Does not apply route., Disp: , Rfl: ;  EPINEPHrine (EPI-PEN) 0.3 mg/0.3 mL DEVI, Inject 0.3 mg into the muscle daily as needed. For anaphylaxis, Disp: , Rfl:  glucose  blood (FREESTYLE INSULINX TEST) test strip, 1 each by Other route daily. Use as instructed, Disp: 360 each, Rfl: 3;  insulin aspart (NOVOLOG FLEXPEN) 100 UNIT/ML injection, Inject 30 Units into the skin 3 (three) times daily before meals., Disp: 10 mL, Rfl: 3;  insulin detemir (LEVEMIR FLEXPEN) 100 UNIT/ML injection, Inject 60 Units into the skin at bedtime., Disp: 10 mL, Rfl: 3 Insulin Pen Needle 32G X 4 MM MISC, 1 each by Does not apply route 3 (three) times daily., Disp: 300 each, Rfl: 3;  Insulin Syringe-Needle U-100 (RELION INSULIN SYRINGE 1ML/31G) 31G X 5/16" 1 ML MISC, 3 times a day or as needed, Disp: 300 each, Rfl: 3;  metformin (FORTAMET) 500 MG (OSM) 24 hr tablet, Take 1 tablet (500 mg total) by mouth daily with breakfast., Disp: 90 tablet, Rfl: 5 montelukast (SINGULAIR) 10 MG tablet, Take 10 mg by mouth at bedtime., Disp: , Rfl: ;  naproxen sodium (ANAPROX) 220 MG tablet, Take 220 mg by mouth daily as needed. For pain, Disp: , Rfl: ;  ONETOUCH DELICA LANCETS MISC, 1 each by Does not apply route daily., Disp: 100 each, Rfl: 6 albuterol (PROVENTIL HFA;VENTOLIN HFA) 108 (90 BASE) MCG/ACT inhaler, Inhale 2 puffs into the  lungs every 4 (four) hours as needed. For shortness of breath/wheezing, Disp: , Rfl: ;  atorvastatin (LIPITOR) 40 MG tablet, Take 1 tablet (40 mg total) by mouth daily., Disp: 90 tablet, Rfl: 3;  furosemide (LASIX) 20 MG tablet, Take 20 mg by mouth daily as needed. For fluid retention, Disp: , Rfl:   EXAM:  Filed Vitals:   08/25/12 1452  BP: 120/80  Pulse: 88  Temp: 98.1 F (36.7 C)    Body mass index is 35.13 kg/(m^2).  GENERAL: vitals reviewed and listed above, alert, oriented, appears well hydrated and in no acute distress  HEENT: atraumatic, conjunttiva clear, no obvious abnormalities on inspection of external nose and ears  NECK: no obvious masses on inspection  MS: moves all extremities without noticeable abnormality -normal ROM head and neck and upper ext bilat  (shoulder, arm, forearm and hand) -normal muscle strength and sensation throughout in upper ext bilat -TTP in R trapezius muscle with muscle tension  PSYCH: pleasant and cooperative, no obvious depression or anxiety  ASSESSMENT AND PLAN:  Discussed the following assessment and plan:  Muscle strain  Carpal tunnel syndrome, right  -no alarming findings on exam, at first thought may be radicular, but eaxm more c/w trapezius muscle tension likely from computer work or poor sleep posture and CTS recurrence -instructions per below -she is going to follow up with PCP for her diabetes -Patient advised to return or notify a doctor immediately if symptoms worsen or persist or new concerns arise.  Patient Instructions  -wear cock up brace at night for carpel tunnel   -for shoulder pain: -heat for 15 minutes twice daily -topical sports creams with capsacin or menthol as needed for pain -500-1000mg  tylenol up to 3 times daily for pain if needed -gentle stretching and exercises for shoulder and neck -proper posture at computer  -follow up with your doctor in 2-4 weeks       KIM, HANNAH R.

## 2012-08-25 NOTE — Patient Instructions (Signed)
-  wear cock up brace at night for carpel tunnel   -for shoulder pain: -heat for 15 minutes twice daily -topical sports creams with capsacin or menthol as needed for pain -500-1000mg  tylenol up to 3 times daily for pain if needed -gentle stretching and exercises for shoulder and neck -proper posture at computer  -follow up with your doctor in 2-4 weeks

## 2012-09-11 ENCOUNTER — Other Ambulatory Visit: Payer: Self-pay | Admitting: Internal Medicine

## 2012-10-01 ENCOUNTER — Encounter (HOSPITAL_COMMUNITY): Payer: Self-pay | Admitting: *Deleted

## 2012-10-01 ENCOUNTER — Emergency Department (HOSPITAL_COMMUNITY)
Admission: EM | Admit: 2012-10-01 | Discharge: 2012-10-01 | Disposition: A | Discharge status: Home / Self Care | Payer: 59 | Source: Home / Self Care | Attending: Family Medicine | Admitting: Family Medicine

## 2012-10-01 DIAGNOSIS — E860 Dehydration: Secondary | ICD-10-CM

## 2012-10-01 DIAGNOSIS — R42 Dizziness and giddiness: Secondary | ICD-10-CM

## 2012-10-01 LAB — POCT I-STAT, CHEM 8
BUN: 10 mg/dL (ref 6–23)
Calcium, Ion: 1.17 mmol/L (ref 1.12–1.23)
Chloride: 103 mEq/L (ref 96–112)
Creatinine, Ser: 0.5 mg/dL (ref 0.50–1.10)
Glucose, Bld: 228 mg/dL — ABNORMAL HIGH (ref 70–99)
HCT: 47 % — ABNORMAL HIGH (ref 36.0–46.0)
Hemoglobin: 16 g/dL — ABNORMAL HIGH (ref 12.0–15.0)
Potassium: 3.8 mEq/L (ref 3.5–5.1)
Sodium: 140 mEq/L (ref 135–145)
TCO2: 26 mmol/L (ref 0–100)

## 2012-10-01 LAB — POCT URINALYSIS DIP (DEVICE)
Glucose, UA: 500 mg/dL — AB
Ketones, ur: 15 mg/dL — AB
Leukocytes, UA: NEGATIVE
Nitrite: NEGATIVE
Protein, ur: 30 mg/dL — AB
Specific Gravity, Urine: 1.025 (ref 1.005–1.030)
Urobilinogen, UA: 2 mg/dL — ABNORMAL HIGH (ref 0.0–1.0)
pH: 6 (ref 5.0–8.0)

## 2012-10-01 MED ORDER — ONDANSETRON 4 MG PO TBDP: 4.0000 mg | ORAL_TABLET | Freq: Three times a day (TID) | ORAL | Status: DC | PRN

## 2012-10-01 MED ORDER — ONDANSETRON 4 MG PO TBDP
ORAL_TABLET | ORAL | Status: AC
  Filled 2012-10-01: qty 2

## 2012-10-01 MED ORDER — MECLIZINE HCL 50 MG PO TABS: 50.0000 mg | ORAL_TABLET | Freq: Three times a day (TID) | ORAL | Status: DC | PRN

## 2012-10-01 MED ORDER — SODIUM CHLORIDE 0.9 % IV BOLUS (SEPSIS)
1000.0000 mL | Freq: Once | INTRAVENOUS | Status: AC
  Administered 2012-10-01: 1000 mL via INTRAVENOUS

## 2012-10-01 MED ORDER — ONDANSETRON 4 MG PO TBDP
8.0000 mg | ORAL_TABLET | Freq: Once | ORAL | Status: AC
  Administered 2012-10-01: 8 mg via ORAL

## 2012-10-01 NOTE — ED Provider Notes (Signed)
History     CSN: [id number]  Arrival date & time 10/01/12  1156   First MD Initiated Contact with Patient 10/01/12 1157      Chief Complaint  Patient presents with  . Dizziness    (Consider location/radiation/quality/duration/timing/severity/associated sxs/prior treatment) HPI Comments: 52 year old female with history of diabetes and Mnire's disease. Here complaining of acute exacerbation of her vertigo since yesterday. Patient reports dizziness nausea vomiting and has been able to keep minimal fluids down since yesterday evening. Denies tinnitus, headache or ear pain. She has had some nasal congestion and seasonal allergy symptoms on prior days. Denies cough or chest pain. No abdominal pain. No diarrhea. She takes Lasix for her Mnire's disease but has not taken since yesterday due to nausea or vomiting.   Past Medical History  Diagnosis Date  . Asthma   . Diabetes mellitus   . Arthritis   . Chicken pox   . GERD (gastroesophageal reflux disease)   . Allergy   . Hypertension   . Hyperlipidemia   . Migraine   . Viral meningitis   . Pericarditis   . Finger infection 2008    isolation    Past Surgical History  Procedure Laterality Date  . Breast surgery  2002    breast bx  . Cesarean section      1997, 2000  . Knee arthroscopy  2010    right    Family History  Problem Relation Age of Onset  . Arthritis Other   . Cancer Other     colon,ovary/uterus,breast,prostate  . Hyperlipidemia Other   . Heart disease Other   . Hypertension Other   . Depression Other   . Colon cancer Neg Hx   . Stomach cancer Neg Hx   . Colon polyps Mother     History  Substance Use Topics  . Smoking status: Never Smoker   . Smokeless tobacco: Never Used  . Alcohol Use: No    OB History   Grav Para Term Preterm Abortions TAB SAB Ect Mult Living                  Review of Systems  Constitutional: Negative for fever, chills, diaphoresis and fatigue.  HENT: Positive for  congestion and rhinorrhea. Negative for ear pain, sore throat and tinnitus.   Eyes: Negative for pain and visual disturbance.  Gastrointestinal: Positive for nausea and vomiting. Negative for abdominal pain and diarrhea.  Endocrine: Negative for polydipsia, polyphagia and polyuria.  Genitourinary: Negative for dysuria.  Skin: Negative for rash.  Neurological: Positive for dizziness. Negative for headaches.  All other systems reviewed and are negative.    Allergies  Penicillins and Zithromax  Home Medications   Current Outpatient Rx  Name  Route  Sig  Dispense  Refill  . albuterol (PROVENTIL HFA;VENTOLIN HFA) 108 (90 BASE) MCG/ACT inhaler   Inhalation   Inhale 2 puffs into the lungs every 4 (four) hours as needed. For shortness of breath/wheezing         . aspirin 81 MG tablet   Oral   Take 162 mg by mouth daily.          . Blood Glucose Monitoring Suppl (FREESTYLE INSULINX SYSTEM) W/DEVICE KIT   Does not apply   by Does not apply route.         Marland Kitchen glucose blood (FREESTYLE INSULINX TEST) test strip      1 each by Other route daily. Use as instructed   360 each   3   .  insulin aspart (NOVOLOG FLEXPEN) 100 UNIT/ML injection   Subcutaneous   Inject 30 Units into the skin 3 (three) times daily before meals.   10 mL   3   . Insulin Pen Needle 32G X 4 MM MISC   Does not apply   1 each by Does not apply route 3 (three) times daily.   300 each   3   . Insulin Syringe-Needle U-100 (RELION INSULIN SYRINGE 1ML/31G) 31G X 5/16" 1 ML MISC      3 times a day or as needed   300 each   3   . LEVEMIR FLEXPEN 100 UNIT/ML injection      Inject subcutaneously 60  units at bedtime   60 mL   2   . metformin (FORTAMET) 500 MG (OSM) 24 hr tablet   Oral   Take 1 tablet (500 mg total) by mouth daily with breakfast.   90 tablet   5   . montelukast (SINGULAIR) 10 MG tablet   Oral   Take 10 mg by mouth at bedtime.         . naproxen sodium (ANAPROX) 220 MG tablet    Oral   Take 220 mg by mouth daily as needed. For pain         . ONETOUCH DELICA LANCETS MISC   Does not apply   1 each by Does not apply route daily.   100 each   6     DX 250.00   . atorvastatin (LIPITOR) 40 MG tablet   Oral   Take 1 tablet (40 mg total) by mouth daily.   90 tablet   3   . EPINEPHrine (EPI-PEN) 0.3 mg/0.3 mL DEVI   Intramuscular   Inject 0.3 mg into the muscle daily as needed. For anaphylaxis         . EXPIRED: furosemide (LASIX) 20 MG tablet   Oral   Take 20 mg by mouth daily as needed. For fluid retention         . meclizine (ANTIVERT) 50 MG tablet   Oral   Take 1 tablet (50 mg total) by mouth 3 (three) times daily as needed for dizziness or nausea.   30 tablet   0   . ondansetron (ZOFRAN-ODT) 4 MG disintegrating tablet   Oral   Take 1 tablet (4 mg total) by mouth every 8 (eight) hours as needed for nausea (Or vertigo).   10 tablet   0     BP 118/79  Pulse 81  Temp(Src) 98.7 F (37.1 C) (Oral)  Resp 18  SpO2 97%  Physical Exam  Nursing note and vitals reviewed. Constitutional: She is oriented to person, place, and time. She appears well-developed and well-nourished.  uncomfortable with nausea and dizziness.   HENT:  Head: Normocephalic and atraumatic.  Right Ear: External ear normal.  Left Ear: External ear normal.  Dry lips, sticky tongue.  Nasal Congestion with erythema and swelling of nasal turbinates, clear rhinorrhea. No pharyngeal erythema no exudates. No uvula deviation. No trismus. TM's normal.  Eyes: Conjunctivae and EOM are normal. Pupils are equal, round, and reactive to light. Right eye exhibits no discharge. Left eye exhibits no discharge.  Neck: Neck supple. No JVD present.  Cardiovascular: Normal rate, regular rhythm and normal heart sounds.  Exam reveals no gallop and no friction rub.   No murmur heard. Pulmonary/Chest: Effort normal and breath sounds normal.  Abdominal: Soft. There is no tenderness.   Lymphadenopathy:    She  has no cervical adenopathy.  Neurological: She is alert and oriented to person, place, and time. She has normal strength. No cranial nerve deficit or sensory deficit.  Skin: No rash noted.    ED Course  Procedures (including critical care time)  Labs Reviewed  POCT URINALYSIS DIP (DEVICE) - Abnormal; Notable for the following:    Glucose, UA 500 (*)    Bilirubin Urine SMALL (*)    Ketones, ur 15 (*)    Hgb urine dipstick SMALL (*)    Protein, ur 30 (*)    Urobilinogen, UA 2.0 (*)    All other components within normal limits  POCT I-STAT, CHEM 8 - Abnormal; Notable for the following:    Glucose, Bld 228 (*)    Hemoglobin 16.0 (*)    HCT 47.0 (*)    All other components within normal limits   No results found.   1. Vertigo   2. Dehydration       MDM  Patient was treated with ondansetron sublingual 8 mg x1. As she percent at with mild-to-moderate clinical dehydration was given IV fluids 1 L normal saline x1. Patient felt much better after ondansetron and IV fluids was able to tolerate oral po as well with no emesis prior discharge.  Prescribed ondansetron and meclizine. Advised to continue oral hydration with none sugary fluids at home. Patient states she has a followup appointment with her ENT specialist in one week. Supportive care and red flags that should prompt her return to medical attention discussed with patient and provided in writing.        Sharin Grave, MD 10/03/12 [phone]

## 2012-10-01 NOTE — ED Notes (Signed)
Patient given of water. After drinking the water patient started dry heaving but did not vomit. Patient stated her headache was worse but did not vomit. She denied nausea.

## 2012-10-01 NOTE — ED Notes (Signed)
Patient complains of dizziness with nausea x 1 day. Unable to walk, nausea anytime she drinks or eats. Denies fever/chills.

## 2012-10-02 ENCOUNTER — Telehealth: Payer: Self-pay | Admitting: Internal Medicine

## 2012-10-02 NOTE — Telephone Encounter (Signed)
I spoke with pt and informed her that Dr Kirtland Bouchard was full for today, but that he would be happy to see her tomorrow. She states that the Antivert the UC gave her seems to be working now, and she is starting to feel better. She asked for a work note for today, and I advised that she would need to get that from UC, since they saw her but we did not. She was agreeable to this. I also told her to call in the a.m. If not and felt she needed to be seen. Pt verbalized understanding. Encounter closed.

## 2012-10-02 NOTE — Telephone Encounter (Signed)
Called regarding dizziness; vomits when eats or drinks.  No answer when called back; left message on answering machine to call back if assistance still needed.

## 2012-10-02 NOTE — Telephone Encounter (Signed)
Caller: Renee Lam/Patient; Phone: [phone]; Reason for Call: Need a Dr's note for being out of work for today.  Must have a call back today.  Has been waiting to receive a call back all day today on being seen in the office.  Has not heard from anyone.

## 2012-10-02 NOTE — Telephone Encounter (Signed)
Patient Information:  Caller Name: A[place]an  Phone: [phone]  Patient: Renee Lam, Renee Lam  Gender: Female  DOB: 15-Nov-1960  Age: 52 Years  PCP: [name] [facility])  Pregnant: No  Office Follow Up:  Does the office need to follow up with this patient?: Yes  Instructions For The Office: OFFICE NO APPT WITH DR K.  CAN PT BE WORKED INTO HIS SCHEDULE?   Symptoms  Reason For Call & Symptoms: pt was seen by UC on 10/01/12 (IV fluids and medication).  Pt states she is some better but is still vomiting.  Pt states she has a lot of ear pressure.  Reviewed Health History In EMR: Yes  Reviewed Medications In EMR: Yes  Reviewed Allergies In EMR: Yes  Reviewed Surgeries / Procedures: Yes  Date of Onset of Symptoms: 09/30/2012  Treatments Tried: antivert and Zofran  Treatments Tried Worked: No OB / GYN:  LMP: Unknown  Guideline(s) Used:  Dizziness  Disposition Per Guideline:   Go to Office Now  Reason For Disposition Reached:   Spinning or tilting sensation (vertigo) present now  Advice Given:  Drink Fluids:  Drink several glasses of fruit juice, other clear fluids, or water. This will improve hydration and blood glucose. If you have a fever or have had heat exposure, make sure the fluids are cold.  Rest for 1-2 Hours:  Lie down with feet elevated for 1 hour. This will improve blood flow and increase blood flow to the brain.  Call Back If:  You become worse.  Patient Will Follow Care Advice:  YES

## 2012-10-03 ENCOUNTER — Ambulatory Visit (INDEPENDENT_AMBULATORY_CARE_PROVIDER_SITE_OTHER): Payer: 59 | Admitting: Internal Medicine

## 2012-10-03 ENCOUNTER — Encounter: Payer: Self-pay | Admitting: Internal Medicine

## 2012-10-03 ENCOUNTER — Telehealth: Payer: Self-pay | Admitting: Internal Medicine

## 2012-10-03 VITALS — BP 136/80 | HR 83 | Temp 98.8°F | Resp 20 | Wt 172.0 lb

## 2012-10-03 DIAGNOSIS — H8110 Benign paroxysmal vertigo, unspecified ear: Secondary | ICD-10-CM

## 2012-10-03 DIAGNOSIS — J45909 Unspecified asthma, uncomplicated: Secondary | ICD-10-CM

## 2012-10-03 DIAGNOSIS — E119 Type 2 diabetes mellitus without complications: Secondary | ICD-10-CM

## 2012-10-03 MED ORDER — ONDANSETRON HCL 4 MG PO TABS: 4.0000 mg | ORAL_TABLET | Freq: Three times a day (TID) | ORAL | Status: DC | PRN

## 2012-10-03 MED ORDER — FUROSEMIDE 20 MG PO TABS: 20.0000 mg | ORAL_TABLET | Freq: Every day | ORAL | Status: DC | PRN

## 2012-10-03 MED ORDER — ALBUTEROL SULFATE HFA 108 (90 BASE) MCG/ACT IN AERS: 2.0000 | INHALATION_SPRAY | RESPIRATORY_TRACT | Status: DC | PRN

## 2012-10-03 MED ORDER — ATORVASTATIN CALCIUM 40 MG PO TABS: 40.0000 mg | ORAL_TABLET | Freq: Every day | ORAL | Status: DC

## 2012-10-03 NOTE — Telephone Encounter (Signed)
Pt called by scheduler and told to come to the office now.

## 2012-10-03 NOTE — Patient Instructions (Signed)
Benign Positional Vertigo  Vertigo means you feel like you or your surroundings are moving when they are not. Benign positional vertigo is the most common form of vertigo. Benign means that the cause of your condition is not serious. Benign positional vertigo is more common in older adults.  CAUSES   Benign positional vertigo is the result of an upset in the labyrinth system. This is an area in the middle ear that helps control your balance. This may be caused by a viral infection, head injury, or repetitive motion. However, often no specific cause is found.  SYMPTOMS   Symptoms of benign positional vertigo occur when you move your head or eyes in different directions. Some of the symptoms may include:  · Loss of balance and falls.  · Vomiting.  · Blurred vision.  · Dizziness.  · Nausea.  · Involuntary eye movements (nystagmus).  DIAGNOSIS   Benign positional vertigo is usually diagnosed by physical exam. If the specific cause of your benign positional vertigo is unknown, your caregiver may perform imaging tests, such as magnetic resonance imaging (MRI) or computed tomography (CT).  TREATMENT   Your caregiver may recommend movements or procedures to correct the benign positional vertigo. Medicines such as meclizine, benzodiazepines, and medicines for nausea may be used to treat your symptoms. In rare cases, if your symptoms are caused by certain conditions that affect the inner ear, you may need surgery.  HOME CARE INSTRUCTIONS   · Follow your caregiver's instructions.  · Move slowly. Do not make sudden body or head movements.  · Avoid driving.  · Avoid operating heavy machinery.  · Avoid performing any tasks that would be dangerous to you or others during a vertigo episode.  · Drink enough fluids to keep your urine clear or pale yellow.  SEEK IMMEDIATE MEDICAL CARE IF:   · You develop problems with walking, weakness, numbness, or using your arms, hands, or legs.  · You have difficulty speaking.  · You develop  severe headaches.  · Your nausea or vomiting continues or gets worse.  · You develop visual changes.  · Your family or friends notice any behavioral changes.  · Your condition gets worse.  · You have a fever.  · You develop a stiff neck or sensitivity to light.  MAKE SURE YOU:   · Understand these instructions.  · Will watch your condition.  · Will get help right away if you are not doing well or get worse.  Document Released: 03/08/2006 Document Revised: 08/23/2011 Document Reviewed: 02/18/2011  [employer]® Patient Information ©2013 [employer].

## 2012-10-03 NOTE — Progress Notes (Signed)
Subjective:    Patient ID: Renee Lam, female    DOB: 1960/08/19, 52 y.o.   MRN: [id number]  HPI  52 year old patient who is seen today do to vertigo associated with nausea and vomiting. She woke up 3 days ago with vertigo associated with intermittent nausea and vomiting. She was seen at the urgent care 2 days ago and treated with IV fluids and anti-emetics. She states that approximately 8 years ago she was treated for BPPV and approximately 3 years ago was evaluated by ENT with a diagnosis of Mnire's.  Symptoms are aggravated by movement. She states she has a tendency to fall to the left  Past Medical History  Diagnosis Date  . Asthma   . Diabetes mellitus   . Arthritis   . Chicken pox   . GERD (gastroesophageal reflux disease)   . Allergy   . Hypertension   . Hyperlipidemia   . Migraine   . Viral meningitis   . Pericarditis   . Finger infection 2008    isolation    History   Social History  . Marital Status: Divorced    Spouse Name: N/A    Number of Children: N/A  . Years of Education: N/A   Occupational History  . Not on file.   Social History Main Topics  . Smoking status: Never Smoker   . Smokeless tobacco: Never Used  . Alcohol Use: No  . Drug Use: No  . Sexually Active: Not on file   Other Topics Concern  . Not on file   Social History Narrative  . No narrative on file    Past Surgical History  Procedure Laterality Date  . Breast surgery  2002    breast bx  . Cesarean section      1997, 2000  . Knee arthroscopy  2010    right    Family History  Problem Relation Age of Onset  . Arthritis Other   . Cancer Other     colon,ovary/uterus,breast,prostate  . Hyperlipidemia Other   . Heart disease Other   . Hypertension Other   . Depression Other   . Colon cancer Neg Hx   . Stomach cancer Neg Hx   . Colon polyps Mother     Allergies  Allergen Reactions  . Penicillins Hives  . Zithromax (Azithromycin) Hives    z-max; states can  take Z pack    Current Outpatient Prescriptions on File Prior to Visit  Medication Sig Dispense Refill  . aspirin 81 MG tablet Take 162 mg by mouth daily.       . Blood Glucose Monitoring Suppl (FREESTYLE INSULINX SYSTEM) W/DEVICE KIT by Does not apply route.      Marland Kitchen EPINEPHrine (EPI-PEN) 0.3 mg/0.3 mL DEVI Inject 0.3 mg into the muscle daily as needed. For anaphylaxis      . glucose blood (FREESTYLE INSULINX TEST) test strip 1 each by Other route daily. Use as instructed  360 each  3  . insulin aspart (NOVOLOG FLEXPEN) 100 UNIT/ML injection Inject 30 Units into the skin 3 (three) times daily before meals.  10 mL  3  . Insulin Pen Needle 32G X 4 MM MISC 1 each by Does not apply route 3 (three) times daily.  300 each  3  . Insulin Syringe-Needle U-100 (RELION INSULIN SYRINGE 1ML/31G) 31G X 5/16" 1 ML MISC 3 times a day or as needed  300 each  3  . LEVEMIR FLEXPEN 100 UNIT/ML injection Inject subcutaneously 60  units at bedtime  60 mL  2  . meclizine (ANTIVERT) 50 MG tablet Take 1 tablet (50 mg total) by mouth 3 (three) times daily as needed for dizziness or nausea.  30 tablet  0  . metformin (FORTAMET) 500 MG (OSM) 24 hr tablet Take 1 tablet (500 mg total) by mouth daily with breakfast.  90 tablet  5  . montelukast (SINGULAIR) 10 MG tablet Take 10 mg by mouth at bedtime.      . naproxen sodium (ANAPROX) 220 MG tablet Take 220 mg by mouth daily as needed. For pain      . ondansetron (ZOFRAN-ODT) 4 MG disintegrating tablet Take 1 tablet (4 mg total) by mouth every 8 (eight) hours as needed for nausea (Or vertigo).  10 tablet  0  . ONETOUCH DELICA LANCETS MISC 1 each by Does not apply route daily.  100 each  6   No current facility-administered medications on file prior to visit.    BP 136/80  Pulse 83  Temp(Src) 98.8 F (37.1 C) (Oral)  Resp 20  Wt 172 lb (78.019 kg)  BMI 34.72 kg/m2  SpO2 97%       Review of Systems  Constitutional: Negative.   HENT: Positive for hearing loss and  tinnitus. Negative for congestion, sore throat, rhinorrhea, dental problem and sinus pressure.   Eyes: Negative for pain, discharge and visual disturbance.  Respiratory: Negative for cough and shortness of breath.   Cardiovascular: Negative for chest pain, palpitations and leg swelling.  Gastrointestinal: Positive for nausea and vomiting. Negative for abdominal pain, diarrhea, constipation, blood in stool and abdominal distention.  Genitourinary: Negative for dysuria, urgency, frequency, hematuria, flank pain, vaginal bleeding, vaginal discharge, difficulty urinating, vaginal pain and pelvic pain.  Musculoskeletal: Negative for joint swelling, arthralgias and gait problem.  Skin: Negative for rash.  Neurological: Positive for dizziness. Negative for syncope, speech difficulty, weakness, numbness and headaches.  Hematological: Negative for adenopathy.  Psychiatric/Behavioral: Negative for behavioral problems, dysphoric mood and agitation. The patient is not nervous/anxious.        Objective:   Physical Exam  Constitutional: She is oriented to person, place, and time. She appears well-developed and well-nourished.  No acute distress. Able to transfer from the sitting position to the examining table without assistance. Blood pressure normal. Afebrile  HENT:  Head: Normocephalic.  Right Ear: External ear normal.  Left Ear: External ear normal.  Mouth/Throat: Oropharynx is clear and moist.  Eyes: Conjunctivae and EOM are normal. Pupils are equal, round, and reactive to light.  Neck: Normal range of motion. Neck supple. No thyromegaly present.  Cardiovascular: Normal rate, regular rhythm, normal heart sounds and intact distal pulses.   Pulmonary/Chest: Effort normal and breath sounds normal.  Abdominal: Soft. Bowel sounds are normal. She exhibits no mass. There is no tenderness.  Musculoskeletal: Normal range of motion.  Lymphadenopathy:    She has no cervical adenopathy.  Neurological: She  is alert and oriented to person, place, and time.  Normal gait  But  slightly unsteady Normal finger to nose testing Normal physiologic nystagmus on lateral gaze  Skin: Skin is warm and dry. No rash noted.  Psychiatric: She has a normal mood and affect. Her behavior is normal.          Assessment & Plan:    recurrent positional vertigo; suspect BPPV although has been given a diagnosis of Mnire's disease by ENT.  We'll continue symptomatic treatment. Instructions for repositioning exercises given. She is scheduled for followup  in 3 days. Will reassess general status and diabetes at that time

## 2012-10-03 NOTE — Telephone Encounter (Signed)
Patient Information:  Caller Name: Pama  Phone: [phone]  Patient: Renee Lam, Renee Lam  Gender: Female  DOB: December 14, 1960  Age: 52 Years  PCP: [name] [facility])  Pregnant: No  Office Follow Up:  Does the office need to follow up with this patient?: Yes  Instructions For The Office: Patient with disposition:  See in office now.  No Appt available. Please review and contact patient right away at [phone].  Is 15 minutes from office.   Symptoms  Reason For Call & Symptoms: Vomiting with vertigo started Sat 4/19, [facility] and [facility].  Upper abdominal area pain on both sides today at 11 am and if pressing on stomach area feels nausea and like going to throw up.  Vomitting all morning today, abdominal pain intermittent but had to leave work.  Feeling hot and cold.  Abdomen looks swollen.  Reviewed Health History In EMR: Yes  Reviewed Medications In EMR: Yes  Reviewed Allergies In EMR: Yes  Reviewed Surgeries / Procedures: Yes  Date of Onset of Symptoms: 09/30/2012 OB / GYN:  LMP: Unknown  Guideline(s) Used:  Abdominal Pain - Female  Abdominal Pain - Upper  Disposition Per Guideline:   Go to Office Now  Reason For Disposition Reached:   Vomiting and abdomen looks much more swollen than usual  Advice Given:  N/A  Patient Will Follow Care Advice:  YES

## 2012-10-06 ENCOUNTER — Encounter: Payer: Self-pay | Admitting: Internal Medicine

## 2012-10-06 ENCOUNTER — Ambulatory Visit (INDEPENDENT_AMBULATORY_CARE_PROVIDER_SITE_OTHER): Payer: 59 | Admitting: Internal Medicine

## 2012-10-06 VITALS — BP 110/70 | HR 84 | Temp 98.2°F | Wt 169.0 lb

## 2012-10-06 DIAGNOSIS — J45909 Unspecified asthma, uncomplicated: Secondary | ICD-10-CM

## 2012-10-06 DIAGNOSIS — E7800 Pure hypercholesterolemia, unspecified: Secondary | ICD-10-CM

## 2012-10-06 DIAGNOSIS — E119 Type 2 diabetes mellitus without complications: Secondary | ICD-10-CM

## 2012-10-06 NOTE — Patient Instructions (Signed)
Please check your hemoglobin A1c every 3 months    It is important that you exercise regularly, at least 20 minutes 3 to 4 times per week.  If you develop chest pain or shortness of breath seek  medical attention.  You need to lose weight.  Consider a lower calorie diet and regular exercise. 

## 2012-10-06 NOTE — Progress Notes (Signed)
Subjective:    Patient ID: Renee Lam, female    DOB: 1961/02/29, 52 y.o.   MRN: [id number]  HPI  52 year old patient who is seen today for followup of diabetes. She was seen her in a week for vertigo that has largely resolved. Her last and the A1c was quite elevated but did have one checked at [facility] earlier this week. Results are not available. She feels her glycemic control is much improved. She is on Levemir 60 units at bedtime along with 30 units of NovoLog prior to meals. Most days she has only 2 mealtime injections rather than 3. She has a difficult time giving herself the mid day and injection. She remains on metformin 500 mg daily. Higher dosages have not been well-tolerated in the past  Wt Readings from Last 3 Encounters:  10/06/12 169 lb (76.658 kg)  10/03/12 172 lb (78.019 kg)  08/25/12 174 lb (78.926 kg)   Past Medical History  Diagnosis Date  . Asthma   . Diabetes mellitus   . Arthritis   . Chicken pox   . GERD (gastroesophageal reflux disease)   . Allergy   . Hypertension   . Hyperlipidemia   . Migraine   . Viral meningitis   . Pericarditis   . Finger infection 2008    isolation    History   Social History  . Marital Status: Divorced    Spouse Name: N/A    Number of Children: N/A  . Years of Education: N/A   Occupational History  . Not on file.   Social History Main Topics  . Smoking status: Never Smoker   . Smokeless tobacco: Never Used  . Alcohol Use: No  . Drug Use: No  . Sexually Active: Not on file   Other Topics Concern  . Not on file   Social History Narrative  . No narrative on file    Past Surgical History  Procedure Laterality Date  . Breast surgery  2002    breast bx  . Cesarean section      1997, 2000  . Knee arthroscopy  2010    right    Family History  Problem Relation Age of Onset  . Arthritis Other   . Cancer Other     colon,ovary/uterus,breast,prostate  . Hyperlipidemia Other   . Heart disease Other   .  Hypertension Other   . Depression Other   . Colon cancer Neg Hx   . Stomach cancer Neg Hx   . Colon polyps Mother     Allergies  Allergen Reactions  . Penicillins Hives  . Zithromax (Azithromycin) Hives    z-max; states can take Z pack    Current Outpatient Prescriptions on File Prior to Visit  Medication Sig Dispense Refill  . albuterol (PROVENTIL HFA;VENTOLIN HFA) 108 (90 BASE) MCG/ACT inhaler Inhale 2 puffs into the lungs every 4 (four) hours as needed. For shortness of breath/wheezing  1 Inhaler  5  . aspirin 81 MG tablet Take 162 mg by mouth daily.       Marland Kitchen atorvastatin (LIPITOR) 40 MG tablet Take 1 tablet (40 mg total) by mouth daily.  30 tablet  5  . Blood Glucose Monitoring Suppl (FREESTYLE INSULINX SYSTEM) W/DEVICE KIT by Does not apply route.      Marland Kitchen EPINEPHrine (EPI-PEN) 0.3 mg/0.3 mL DEVI Inject 0.3 mg into the muscle daily as needed. For anaphylaxis      . furosemide (LASIX) 20 MG tablet Take 1 tablet (20 mg total)  by mouth daily as needed. For fluid retention  30 tablet  2  . glucose blood (FREESTYLE INSULINX TEST) test strip 1 each by Other route daily. Use as instructed  360 each  3  . insulin aspart (NOVOLOG FLEXPEN) 100 UNIT/ML injection Inject 30 Units into the skin 3 (three) times daily before meals.  10 mL  3  . Insulin Pen Needle 32G X 4 MM MISC 1 each by Does not apply route 3 (three) times daily.  300 each  3  . Insulin Syringe-Needle U-100 (RELION INSULIN SYRINGE 1ML/31G) 31G X 5/16" 1 ML MISC 3 times a day or as needed  300 each  3  . LEVEMIR FLEXPEN 100 UNIT/ML injection Inject subcutaneously 60  units at bedtime  60 mL  2  . meclizine (ANTIVERT) 50 MG tablet Take 1 tablet (50 mg total) by mouth 3 (three) times daily as needed for dizziness or nausea.  30 tablet  0  . metformin (FORTAMET) 500 MG (OSM) 24 hr tablet Take 1 tablet (500 mg total) by mouth daily with breakfast.  90 tablet  5  . montelukast (SINGULAIR) 10 MG tablet Take 10 mg by mouth at bedtime.       . naproxen sodium (ANAPROX) 220 MG tablet Take 220 mg by mouth daily as needed. For pain      . ondansetron (ZOFRAN) 4 MG tablet Take 1 tablet (4 mg total) by mouth every 8 (eight) hours as needed for nausea.  20 tablet  3  . ondansetron (ZOFRAN-ODT) 4 MG disintegrating tablet Take 1 tablet (4 mg total) by mouth every 8 (eight) hours as needed for nausea (Or vertigo).  10 tablet  0  . ONETOUCH DELICA LANCETS MISC 1 each by Does not apply route daily.  100 each  6   No current facility-administered medications on file prior to visit.    BP 110/70  Pulse 84  Temp(Src) 98.2 F (36.8 C) (Oral)  Wt 169 lb (76.658 kg)  BMI 34.12 kg/m2    Review of Systems  Constitutional: Negative.   HENT: Negative for hearing loss, congestion, sore throat, rhinorrhea, dental problem, sinus pressure and tinnitus.   Eyes: Negative for pain, discharge and visual disturbance.  Respiratory: Negative for cough and shortness of breath.   Cardiovascular: Negative for chest pain, palpitations and leg swelling.  Gastrointestinal: Negative for nausea, vomiting, abdominal pain, diarrhea, constipation, blood in stool and abdominal distention.  Genitourinary: Negative for dysuria, urgency, frequency, hematuria, flank pain, vaginal bleeding, vaginal discharge, difficulty urinating, vaginal pain and pelvic pain.  Musculoskeletal: Negative for joint swelling, arthralgias and gait problem.  Skin: Negative for rash.  Neurological: Negative for dizziness, syncope, speech difficulty, weakness, numbness and headaches.  Hematological: Negative for adenopathy.  Psychiatric/Behavioral: Negative for behavioral problems, dysphoric mood and agitation. The patient is not nervous/anxious.        Objective:   Physical Exam  Constitutional: She appears well-developed and well-nourished. No distress.  Blood pressure 110/70          Assessment & Plan:   Diabetes mellitus. Patient had a hemoglobin A1c earlier this week. Will  attempt to track down the results. We'll continue present regimen. Diabetes seems to be much improved Dyslipidemia stable Vertigo resolved  Recheck 3 months

## 2012-10-10 ENCOUNTER — Telehealth: Payer: Self-pay | Admitting: Internal Medicine

## 2012-10-10 NOTE — Telephone Encounter (Signed)
Pt would like blood work  Results from [facility]

## 2012-10-11 ENCOUNTER — Telehealth (HOSPITAL_COMMUNITY): Payer: Self-pay | Admitting: Family Medicine

## 2012-10-11 NOTE — ED Notes (Signed)
[facility] called in re: to Meclizine 50mg  that was Rx by Dr. Alfonse Ras It only comes in 12.5mg  and 25mg ; Per Dr. Ventura Bruns Alas, change to 25mg  TID, 30 to be dispensed, no refills

## 2012-10-12 NOTE — Telephone Encounter (Signed)
Spoke to pt told her I have not see lab work asked her to have them re fax to attention Lupita Leash. Pt verbalized understanding, awaiting fax.

## 2012-10-12 NOTE — Telephone Encounter (Signed)
Pt states [facility] would not release info to her about results, needs MD office to call, [phone].

## 2012-10-12 NOTE — Telephone Encounter (Signed)
Left message for pt that I found lab work in Dr. Amador Cunas office and I will have him review it today and get back to her.

## 2012-10-13 NOTE — Telephone Encounter (Signed)
Spoke to pt told her labs were normal per Dr. Amador Cunas. Pt verbalized understanding and asked me to fax copy of labs to her. Told her okay, labs faxed.

## 2012-11-07 ENCOUNTER — Telehealth: Payer: Self-pay | Admitting: Internal Medicine

## 2012-11-07 NOTE — Telephone Encounter (Signed)
[facility] Triage Call Report Triage Record Num: [id number] Operator: Hyman Bower Patient Name: Renee Lam Call Date & Time: 11/05/2012 10:37:00AM Patient Phone: [phone] PCP: Gordy Savers Patient Gender: Female PCP Fax : [phone] Patient DOB: 26-Oct-1960 [facility] Name: [facility] Reason for Call: Caller: Stuart/Patient; PCP: [name] (Family Practice); CB#: [phone]; Call regarding Medication Issue; Medication(s): Levemir pen not working. She can dial the pen to her dosage, and when the pen needle is attached the pen will not inject medication. It is stuck. Patient was not able to take her dose last night. She is out of town until Tuesday morning. Blood sugar fasting 238 this morning. Called in refill of one Levemir pen, give 60 units SQ at bedtime (verified per EPIC chart), no refills. Pharmacy: [facility] on [address]; Phone: [phone]. Advised patient to call if she develops any symptoms of hyperglycemia. Patient verbalized understanding. Protocol(s) Used: Office Note Recommended Outcome per Protocol: Information Noted and Sent to Office Reason for Outcome: Caller information to office Care Advice: ~ 05/

## 2012-12-19 ENCOUNTER — Ambulatory Visit (INDEPENDENT_AMBULATORY_CARE_PROVIDER_SITE_OTHER): Payer: 59 | Admitting: Family Medicine

## 2012-12-19 ENCOUNTER — Encounter: Payer: Self-pay | Admitting: Family Medicine

## 2012-12-19 VITALS — Temp 98.6°F | Wt 166.0 lb

## 2012-12-19 DIAGNOSIS — M25551 Pain in right hip: Secondary | ICD-10-CM

## 2012-12-19 DIAGNOSIS — M25559 Pain in unspecified hip: Secondary | ICD-10-CM

## 2012-12-19 NOTE — Patient Instructions (Signed)
-  ice for 15 minutes twice daily on hip  -ibuprofen 400mg  or tylenol 500-100mg  up to twice daily for pain as needed  -Home exercises at least 4 times per week  -follow up with your doctor in 3-4 weeks

## 2012-12-19 NOTE — Progress Notes (Signed)
Chief Complaint  Patient presents with  . right hip pain    x 2 weeks; worse this morning     HPI:  52 yo pt of Dr. Charm Rings here for acute visit for hip pain: -started 2 weeks ago - cant think of initiating event other then sat for long periods of time at work -pain is in low back and R lateral hip- sometime L lat hip pain as well -worse with standing up after sitting, stairs - better with  aleve -denies: fevers, malaise, numbness, weakness, bowel or bladder incontinence, trauma or prior trauma to back or pelvis -hx of OA of knees ROS: See pertinent positives and negatives per HPI.  Past Medical History  Diagnosis Date  . Asthma   . Diabetes mellitus   . Arthritis   . Chicken pox   . GERD (gastroesophageal reflux disease)   . Allergy   . Hypertension   . Hyperlipidemia   . Migraine   . Viral meningitis   . Pericarditis   . Finger infection 2008    isolation    Family History  Problem Relation Age of Onset  . Arthritis Other   . Cancer Other     colon,ovary/uterus,breast,prostate  . Hyperlipidemia Other   . Heart disease Other   . Hypertension Other   . Depression Other   . Colon cancer Neg Hx   . Stomach cancer Neg Hx   . Colon polyps Mother     History   Social History  . Marital Status: Divorced    Spouse Name: N/A    Number of Children: N/A  . Years of Education: N/A   Social History Main Topics  . Smoking status: Never Smoker   . Smokeless tobacco: Never Used  . Alcohol Use: No  . Drug Use: No  . Sexually Active: None   Other Topics Concern  . None   Social History Narrative  . None    Current outpatient prescriptions:albuterol (PROVENTIL HFA;VENTOLIN HFA) 108 (90 BASE) MCG/ACT inhaler, Inhale 2 puffs into the lungs every 4 (four) hours as needed. For shortness of breath/wheezing, Disp: 1 Inhaler, Rfl: 5;  aspirin 81 MG tablet, Take 162 mg by mouth daily. , Disp: , Rfl: ;  atorvastatin (LIPITOR) 40 MG tablet, Take 1 tablet (40 mg total) by mouth  daily., Disp: 30 tablet, Rfl: 5 Blood Glucose Monitoring Suppl (FREESTYLE INSULINX SYSTEM) W/DEVICE KIT, by Does not apply route., Disp: , Rfl: ;  EPINEPHrine (EPI-PEN) 0.3 mg/0.3 mL DEVI, Inject 0.3 mg into the muscle daily as needed. For anaphylaxis, Disp: , Rfl: ;  furosemide (LASIX) 20 MG tablet, Take 1 tablet (20 mg total) by mouth daily as needed. For fluid retention, Disp: 30 tablet, Rfl: 2 glucose blood (FREESTYLE INSULINX TEST) test strip, 1 each by Other route daily. Use as instructed, Disp: 360 each, Rfl: 3;  insulin aspart (NOVOLOG FLEXPEN) 100 UNIT/ML injection, Inject 30 Units into the skin 3 (three) times daily before meals., Disp: 10 mL, Rfl: 3;  Insulin Pen Needle 32G X 4 MM MISC, 1 each by Does not apply route 3 (three) times daily., Disp: 300 each, Rfl: 3 Insulin Syringe-Needle U-100 (RELION INSULIN SYRINGE 1ML/31G) 31G X 5/16" 1 ML MISC, 3 times a day or as needed, Disp: 300 each, Rfl: 3;  LEVEMIR FLEXPEN 100 UNIT/ML injection, Inject subcutaneously 60  units at bedtime, Disp: 60 mL, Rfl: 2;  meclizine (ANTIVERT) 50 MG tablet, Take 1 tablet (50 mg total) by mouth 3 (three) times daily  as needed for dizziness or nausea., Disp: 30 tablet, Rfl: 0 metformin (FORTAMET) 500 MG (OSM) 24 hr tablet, Take 1 tablet (500 mg total) by mouth daily with breakfast., Disp: 90 tablet, Rfl: 5;  montelukast (SINGULAIR) 10 MG tablet, Take 10 mg by mouth at bedtime., Disp: , Rfl: ;  naproxen sodium (ANAPROX) 220 MG tablet, Take 220 mg by mouth daily as needed. For pain, Disp: , Rfl:  ondansetron (ZOFRAN) 4 MG tablet, Take 1 tablet (4 mg total) by mouth every 8 (eight) hours as needed for nausea., Disp: 20 tablet, Rfl: 3;  ondansetron (ZOFRAN-ODT) 4 MG disintegrating tablet, Take 1 tablet (4 mg total) by mouth every 8 (eight) hours as needed for nausea (Or vertigo)., Disp: 10 tablet, Rfl: 0;  ONETOUCH DELICA LANCETS MISC, 1 each by Does not apply route daily., Disp: 100 each, Rfl: 6  EXAM:  Filed Vitals:    12/19/12 1100  Temp: 98.6 F (37 C)    Body mass index is 33.51 kg/(m^2).  GENERAL: vitals reviewed and listed above, alert, oriented, appears well hydrated and in no acute distress  HEENT: atraumatic, conjunttiva clear, no obvious abnormalities on inspection of external nose and ears  NECK: no obvious masses on inspection  LUNGS: clear to auscultation bilaterally, no wheezes, rales or rhonchi, good air movement  CV: HRRR, no peripheral edema  MS: moves all extremities without noticeable abnormality Normal Gait Normal inspection of back, no obvious scoliosis or leg length descrepancy No bony TTP, R PSIS TTP Soft tissue TTP at: R greater trochantic bursa/R IT band -/+ tests: neg trendelenburg,-facet loading, -SLRT, -CLRT, +FABER R, -FADIR, + ober test on R Normal muscle strength, sensation to light touch and DTRs in LEs bilaterally  PSYCH: pleasant and cooperative, no obvious depression or anxiety  ASSESSMENT AND PLAN:  Discussed the following assessment and plan:  Right hip pain  -suspect trochanteric bursitis due to tight IT band, also exam findings suggest may have mild sacroiliitis or OA of R SI joint. Discussed options and will do conservative care, HEP and follow up in 3-4 weeks. -Patient advised to return or notify a doctor immediately if symptoms worsen or persist or new concerns arise.  Patient Instructions  -ice for 15 minutes twice daily on hip  -ibuprofen 400mg  or tylenol 500-100mg  up to twice daily for pain as needed  -Home exercises at least 4 times per week  -follow up with your doctor in 3-4 weeks     Brieanne Mignone R.

## 2012-12-21 ENCOUNTER — Emergency Department (HOSPITAL_COMMUNITY)
Admission: EM | Admit: 2012-12-21 | Discharge: 2012-12-21 | Disposition: A | Discharge status: Home / Self Care | Payer: 59 | Attending: Emergency Medicine | Admitting: Emergency Medicine

## 2012-12-21 ENCOUNTER — Encounter (HOSPITAL_COMMUNITY): Payer: Self-pay | Admitting: Emergency Medicine

## 2012-12-21 ENCOUNTER — Emergency Department (HOSPITAL_COMMUNITY): Payer: 59

## 2012-12-21 DIAGNOSIS — Z880 Allergy status to penicillin: Secondary | ICD-10-CM | POA: Insufficient documentation

## 2012-12-21 DIAGNOSIS — Z7982 Long term (current) use of aspirin: Secondary | ICD-10-CM | POA: Insufficient documentation

## 2012-12-21 DIAGNOSIS — I10 Essential (primary) hypertension: Secondary | ICD-10-CM | POA: Insufficient documentation

## 2012-12-21 DIAGNOSIS — Z872 Personal history of diseases of the skin and subcutaneous tissue: Secondary | ICD-10-CM | POA: Insufficient documentation

## 2012-12-21 DIAGNOSIS — M25559 Pain in unspecified hip: Secondary | ICD-10-CM | POA: Insufficient documentation

## 2012-12-21 DIAGNOSIS — J45909 Unspecified asthma, uncomplicated: Secondary | ICD-10-CM | POA: Insufficient documentation

## 2012-12-21 DIAGNOSIS — M129 Arthropathy, unspecified: Secondary | ICD-10-CM | POA: Insufficient documentation

## 2012-12-21 DIAGNOSIS — R209 Unspecified disturbances of skin sensation: Secondary | ICD-10-CM | POA: Insufficient documentation

## 2012-12-21 DIAGNOSIS — M25551 Pain in right hip: Secondary | ICD-10-CM

## 2012-12-21 DIAGNOSIS — E119 Type 2 diabetes mellitus without complications: Secondary | ICD-10-CM | POA: Insufficient documentation

## 2012-12-21 DIAGNOSIS — Z8619 Personal history of other infectious and parasitic diseases: Secondary | ICD-10-CM | POA: Insufficient documentation

## 2012-12-21 DIAGNOSIS — E785 Hyperlipidemia, unspecified: Secondary | ICD-10-CM | POA: Insufficient documentation

## 2012-12-21 DIAGNOSIS — Z79899 Other long term (current) drug therapy: Secondary | ICD-10-CM | POA: Insufficient documentation

## 2012-12-21 DIAGNOSIS — Z794 Long term (current) use of insulin: Secondary | ICD-10-CM | POA: Insufficient documentation

## 2012-12-21 DIAGNOSIS — Z8679 Personal history of other diseases of the circulatory system: Secondary | ICD-10-CM | POA: Insufficient documentation

## 2012-12-21 NOTE — ED Notes (Signed)
PT. REPORTS PROGRESSING RIGHT HIP PAIN RADIATNG TO LOWER BACK AND RIGHT LEG FOR SEVERAL WEEKS , DENIES INJURY OR FALL , AMBULATORY , PT. STATED HISTORY OF BURSITIS /ARTHRITIS .

## 2012-12-21 NOTE — ED Notes (Signed)
Pt returned to room from radiology

## 2012-12-21 NOTE — ED Provider Notes (Signed)
History    CSN: [id number] Arrival date & time 12/21/12  2029  None    Chief Complaint  Patient presents with  . Hip Pain   (Consider location/radiation/quality/duration/timing/severity/associated sxs/prior Treatment) HPI History provided by pt.   Pt c/o non-traumatic R hip pain x 3 weeks.  Radiates into right back and down right leg and associated w/ posterior RLE paresthesias.  Denies fever, bowel/bladder dysfunction and extremity weakness.  Aggravated by walking.  No improvement w/ aleve.   Past Medical History  Diagnosis Date  . Asthma   . Diabetes mellitus   . Arthritis   . Chicken pox   . GERD (gastroesophageal reflux disease)   . Allergy   . Hypertension   . Hyperlipidemia   . Migraine   . Viral meningitis   . Pericarditis   . Finger infection 2008    isolation   Past Surgical History  Procedure Laterality Date  . Breast surgery  2002    breast bx  . Cesarean section      1997, 2000  . Knee arthroscopy  2010    right   Family History  Problem Relation Age of Onset  . Arthritis Other   . Cancer Other     colon,ovary/uterus,breast,prostate  . Hyperlipidemia Other   . Heart disease Other   . Hypertension Other   . Depression Other   . Colon cancer Neg Hx   . Stomach cancer Neg Hx   . Colon polyps Mother    History  Substance Use Topics  . Smoking status: Never Smoker   . Smokeless tobacco: Never Used  . Alcohol Use: No   OB History   Grav Para Term Preterm Abortions TAB SAB Ect Mult Living                 Review of Systems  All other systems reviewed and are negative.    Allergies  Penicillins and Zithromax  Home Medications   Current Outpatient Rx  Name  Route  Sig  Dispense  Refill  . albuterol (PROVENTIL HFA;VENTOLIN HFA) 108 (90 BASE) MCG/ACT inhaler   Inhalation   Inhale 2 puffs into the lungs every 4 (four) hours as needed. For shortness of breath/wheezing   1 Inhaler   5   . aspirin 81 MG tablet   Oral   Take 162 mg by  mouth daily.          Marland Kitchen atorvastatin (LIPITOR) 40 MG tablet   Oral   Take 1 tablet (40 mg total) by mouth daily.   30 tablet   5   . furosemide (LASIX) 20 MG tablet   Oral   Take 20 mg by mouth daily as needed. For fluid retention         . insulin aspart (NOVOLOG FLEXPEN) 100 UNIT/ML injection   Subcutaneous   Inject 30 Units into the skin 3 (three) times daily before meals.   10 mL   3   . LEVEMIR FLEXPEN 100 UNIT/ML injection      Inject subcutaneously 60  units at bedtime   60 mL   2   . meclizine (ANTIVERT) 50 MG tablet   Oral   Take 1 tablet (50 mg total) by mouth 3 (three) times daily as needed for dizziness or nausea.   30 tablet   0   . metformin (FORTAMET) 500 MG (OSM) 24 hr tablet   Oral   Take 1 tablet (500 mg total) by mouth daily with breakfast.  90 tablet   5   . montelukast (SINGULAIR) 10 MG tablet   Oral   Take 10 mg by mouth at bedtime.         . naproxen sodium (ANAPROX) 220 MG tablet   Oral   Take 220 mg by mouth 2 (two) times daily.          . Blood Glucose Monitoring Suppl (FREESTYLE INSULINX SYSTEM) W/DEVICE KIT   Does not apply   by Does not apply route.         Marland Kitchen EPINEPHrine (EPI-PEN) 0.3 mg/0.3 mL DEVI   Intramuscular   Inject 0.3 mg into the muscle daily as needed. For anaphylaxis         . glucose blood (FREESTYLE INSULINX TEST) test strip      1 each by Other route daily. Use as instructed   360 each   3   . Insulin Pen Needle 32G X 4 MM MISC   Does not apply   1 each by Does not apply route 3 (three) times daily.   300 each   3   . Insulin Syringe-Needle U-100 (RELION INSULIN SYRINGE 1ML/31G) 31G X 5/16" 1 ML MISC      3 times a day or as needed   300 each   3   . ONETOUCH DELICA LANCETS MISC   Does not apply   1 each by Does not apply route daily.   100 each   6     DX 250.00    BP 132/84  Pulse 96  Temp(Src) 99 F (37.2 C) (Oral)  Resp 14  SpO2 96% Physical Exam  Nursing note and vitals  reviewed. Constitutional: She is oriented to person, place, and time. She appears well-developed and well-nourished. No distress.  HENT:  Head: Normocephalic and atraumatic.  Eyes:  Normal appearance  Neck: Normal range of motion.  Cardiovascular: Normal rate and regular rhythm.   Pulmonary/Chest: Effort normal and breath sounds normal.  Genitourinary:  No CVA ttp  Musculoskeletal: Normal range of motion.  No skin changes right hip.  Slight Lumbar spinal, right low back/upper buttock and R SI joint tenderness.  There is also tenderness over lateral hip and groin.  Pain w/ passive internal/external rotation; no pain w/ flexion.  Nml patellar reflexes.  No saddle anesthesia. Distal sensation intact.  2+ DP pulses.  Ambulates w/out diffulty.   Neurological: She is alert and oriented to person, place, and time.  Skin: Skin is warm and dry. No rash noted.  Psychiatric: She has a normal mood and affect. Her behavior is normal.    ED Course  Procedures (including critical care time) Labs Reviewed - No data to display Dg Hip Complete Right  12/21/2012   *RADIOLOGY REPORT*  Clinical Data: Right hip pain.  RIGHT HIP - COMPLETE 2+ VIEW  Comparison: 06/25/2009 radiographs  Findings: No evidence of acute fracture, subluxation or dislocation identified.  No radio-opaque foreign bodies are present.  No focal bony lesions are noted.  The joint spaces are unremarkable.  IMPRESSION: No evidence of acute bony abnormality.   Original Report Authenticated By: Harmon Pier, M.D.   1. Pain in hip, right     MDM  52yo F presents w/ non-traumatic R hip pain x 3 weeks.  Xray unremarkable.  No signs of septic arthritis.  No focal neuro deficits.  Ambulatory.  Suspect radiculopathy vs. Osteoarthritis.  Discussed w/ pt.  Ortho tech provided her w/ crutches and I recommended that she continue  aleve as well as tylenol at home.  She will f/u with her orthopedist if no improvement by Monday.  Return precautions discussed.  10:59 PM     Otilio Miu, PA-C 12/21/12 2259

## 2012-12-21 NOTE — ED Notes (Addendum)
Pt c/o right sided hip pain that started 3 weeks ago and continues to get worse. Pt was seen at PCP office yesterday and dx with arthritis and sacroiliititis. Pt sts now the pain has gone into lower back and starting to go into thigh. Pt reports she doesn't want narcotics or injections because she has to be able to work. Pt has been taking Aleve for the pain but doesn't think it works that great. Pt ambulatory to room, with limping. Pt in nad, skin warm and dry, resp e/u.

## 2012-12-22 ENCOUNTER — Telehealth: Payer: Self-pay | Admitting: Internal Medicine

## 2012-12-22 NOTE — Telephone Encounter (Signed)
[facility] Triage Call Report Triage Record Num: [id number] Operator: Maryfrances Bunnell Patient Name: Renee Lam Call Date & Time: 12/21/2012 7:32:22PM Patient Phone: [phone] PCP: Gordy Savers Patient Gender: Female PCP Fax : [phone] Patient DOB: 12/30/1960 [facility] Name[facility] Reason for Call: Caller: [name]/Patient; PCP: [name] (Family Practice); CB#: [phone]; Call regarding Hip pain; Seen in office for right hip pain that started about 11/30/12; ; Doing prescribed exercises, using Aleve and ice but 12/21/12 pain is becoming severe; Having some numbness going down leg and lower back today with severe pain now extending up into back and shoulder; Per Hip Non-Injury Protocol "Unbearable pain." Advised to see ed. Care advice given. Protocol(s) Used: Hip Non-Injury Recommended Outcome per Protocol: See ED Immediately Reason for Outcome: Unbearable pain Care Advice: ~ 07/

## 2012-12-23 NOTE — ED Provider Notes (Signed)
Medical screening examination/treatment/procedure(s) were performed by non-physician practitioner and as supervising physician I was immediately available for consultation/collaboration.  Aitana Burry R. Anis Cinelli, MD 12/23/12 1400 

## 2012-12-26 ENCOUNTER — Other Ambulatory Visit: Payer: Self-pay | Admitting: *Deleted

## 2012-12-26 MED ORDER — ONETOUCH DELICA LANCETS 33G MISC: 1.0000 | Freq: Every day | Status: DC | PRN

## 2013-01-05 ENCOUNTER — Encounter: Payer: Self-pay | Admitting: Internal Medicine

## 2013-01-05 ENCOUNTER — Ambulatory Visit (INDEPENDENT_AMBULATORY_CARE_PROVIDER_SITE_OTHER): Payer: 59 | Admitting: Internal Medicine

## 2013-01-05 VITALS — BP 126/80 | HR 76 | Temp 98.9°F | Resp 20 | Wt 169.0 lb

## 2013-01-05 DIAGNOSIS — E7800 Pure hypercholesterolemia, unspecified: Secondary | ICD-10-CM

## 2013-01-05 DIAGNOSIS — E119 Type 2 diabetes mellitus without complications: Secondary | ICD-10-CM

## 2013-01-05 MED ORDER — ATORVASTATIN CALCIUM 40 MG PO TABS: 40.0000 mg | ORAL_TABLET | Freq: Every day | ORAL | Status: DC

## 2013-01-05 MED ORDER — FUROSEMIDE 20 MG PO TABS: 20.0000 mg | ORAL_TABLET | Freq: Every day | ORAL | Status: DC | PRN

## 2013-01-05 NOTE — Progress Notes (Signed)
Subjective:    Patient ID: Renee Lam, female    DOB: 05-01-1961, 52 y.o.   MRN: [id number]  HPI  52 year old patient who is seen today for followup of type 2 diabetes. She works at [employer] and has a benefit has free  lab testing at work. There's been no hemoglobin A1c since November of last year at which time it was 11.4. She states her fasting blood sugars are generally in the 120 to 1:30 range. She is on Levemir 60 units at bedtime and NovoLog 30 units prior to each meal. She does miss occasional mealtime insulin dosing but states that she is compliant at least 85 times out of 90 per month. For the past 3 months she has had 3 episodes of documented hypoglycemia with blood sugars  in the mid 40s No eye examination this year Medical regimen includes metformin 500 mg once daily. She has been intolerant of a higher dose  Past Medical History  Diagnosis Date  . Asthma   . Diabetes mellitus   . Arthritis   . Chicken pox   . GERD (gastroesophageal reflux disease)   . Allergy   . Hypertension   . Hyperlipidemia   . Migraine   . Viral meningitis   . Pericarditis   . Finger infection 2008    isolation    History   Social History  . Marital Status: Divorced    Spouse Name: N/A    Number of Children: N/A  . Years of Education: N/A   Occupational History  . Not on file.   Social History Main Topics  . Smoking status: Never Smoker   . Smokeless tobacco: Never Used  . Alcohol Use: No  . Drug Use: No  . Sexually Active: Not on file   Other Topics Concern  . Not on file   Social History Narrative  . No narrative on file    Past Surgical History  Procedure Laterality Date  . Breast surgery  2002    breast bx  . Cesarean section      1997, 2000  . Knee arthroscopy  2010    right    Family History  Problem Relation Age of Onset  . Arthritis Other   . Cancer Other     colon,ovary/uterus,breast,prostate  . Hyperlipidemia Other   . Heart disease Other   .  Hypertension Other   . Depression Other   . Colon cancer Neg Hx   . Stomach cancer Neg Hx   . Colon polyps Mother     Allergies  Allergen Reactions  . Penicillins Hives  . Zithromax (Azithromycin) Hives    z-max; states can take Z pack    Current Outpatient Prescriptions on File Prior to Visit  Medication Sig Dispense Refill  . albuterol (PROVENTIL HFA;VENTOLIN HFA) 108 (90 BASE) MCG/ACT inhaler Inhale 2 puffs into the lungs every 4 (four) hours as needed. For shortness of breath/wheezing  1 Inhaler  5  . aspirin 81 MG tablet Take 162 mg by mouth daily.       . Blood Glucose Monitoring Suppl (FREESTYLE INSULINX SYSTEM) W/DEVICE KIT by Does not apply route.      Marland Kitchen EPINEPHrine (EPI-PEN) 0.3 mg/0.3 mL DEVI Inject 0.3 mg into the muscle daily as needed. For anaphylaxis      . glucose blood (FREESTYLE INSULINX TEST) test strip 1 each by Other route daily. Use as instructed  360 each  3  . insulin aspart (NOVOLOG FLEXPEN) 100 UNIT/ML injection  Inject 30 Units into the skin 3 (three) times daily before meals.  10 mL  3  . Insulin Pen Needle 32G X 4 MM MISC 1 each by Does not apply route 3 (three) times daily.  300 each  3  . Insulin Syringe-Needle U-100 (RELION INSULIN SYRINGE 1ML/31G) 31G X 5/16" 1 ML MISC 3 times a day or as needed  300 each  3  . LEVEMIR FLEXPEN 100 UNIT/ML injection Inject subcutaneously 60  units at bedtime  60 mL  2  . meclizine (ANTIVERT) 50 MG tablet Take 1 tablet (50 mg total) by mouth 3 (three) times daily as needed for dizziness or nausea.  30 tablet  0  . metformin (FORTAMET) 500 MG (OSM) 24 hr tablet Take 1 tablet (500 mg total) by mouth daily with breakfast.  90 tablet  5  . montelukast (SINGULAIR) 10 MG tablet Take 10 mg by mouth at bedtime.      . naproxen sodium (ANAPROX) 220 MG tablet Take 220 mg by mouth 2 (two) times daily.       Letta Pate DELICA LANCETS 33G MISC 1 each by Does not apply route daily as needed.  300 each  3   No current  facility-administered medications on file prior to visit.    BP 126/80  Pulse 76  Temp(Src) 98.9 F (37.2 C) (Oral)  Resp 20  Wt 169 lb (76.658 kg)  BMI 34.12 kg/m2  SpO2 98%     Review of Systems  Constitutional: Negative.   HENT: Negative for hearing loss, congestion, sore throat, rhinorrhea, dental problem, sinus pressure and tinnitus.   Eyes: Negative for pain, discharge and visual disturbance.  Respiratory: Negative for cough and shortness of breath.   Cardiovascular: Negative for chest pain, palpitations and leg swelling.  Gastrointestinal: Negative for nausea, vomiting, abdominal pain, diarrhea, constipation, blood in stool and abdominal distention.  Genitourinary: Negative for dysuria, urgency, frequency, hematuria, flank pain, vaginal bleeding, vaginal discharge, difficulty urinating, vaginal pain and pelvic pain.  Musculoskeletal: Negative for joint swelling, arthralgias and gait problem.       Hip pain improving  Skin: Negative for rash.  Neurological: Negative for dizziness, syncope, speech difficulty, weakness, numbness and headaches.  Hematological: Negative for adenopathy.  Psychiatric/Behavioral: Negative for behavioral problems, dysphoric mood and agitation. The patient is not nervous/anxious.        Objective:   Physical Exam  Constitutional: She is oriented to person, place, and time. She appears well-developed and well-nourished.  HENT:  Head: Normocephalic.  Right Ear: External ear normal.  Left Ear: External ear normal.  Mouth/Throat: Oropharynx is clear and moist.  Eyes: Conjunctivae and EOM are normal. Pupils are equal, round, and reactive to light.  Neck: Normal range of motion. Neck supple. No thyromegaly present.  Cardiovascular: Normal rate, regular rhythm, normal heart sounds and intact distal pulses.   Pulmonary/Chest: Effort normal and breath sounds normal.  Abdominal: Soft. Bowel sounds are normal. She exhibits no mass. There is no  tenderness.  Musculoskeletal: Normal range of motion.  Lymphadenopathy:    She has no cervical adenopathy.  Neurological: She is alert and oriented to person, place, and time.  Skin: Skin is warm and dry. No rash noted.  Psychiatric: She has a normal mood and affect. Her behavior is normal.          Assessment & Plan:   Diabetes mellitus. Unclear control. She is scheduled for a hemoglobin A1c at [facility] on August 19. Will review Dyslipidemia. Lipitor  refilled Asthma stable  Recheck 3 months Low-salt diet regular exercise encouraged modest weight loss encouraged No change medications

## 2013-01-05 NOTE — Patient Instructions (Addendum)
Limit your sodium (Salt) intake   Please check your hemoglobin A1c every 3 months    It is important that you exercise regularly, at least 20 minutes 3 to 4 times per week.  If you develop chest pain or shortness of breath seek  medical attention.   

## 2013-03-03 ENCOUNTER — Emergency Department (HOSPITAL_COMMUNITY)
Admission: EM | Admit: 2013-03-03 | Discharge: 2013-03-03 | Disposition: A | Discharge status: Nursing Home (Includes ICF) | Payer: 59 | Source: Home / Self Care

## 2013-03-03 ENCOUNTER — Encounter (HOSPITAL_COMMUNITY): Payer: Self-pay | Admitting: Emergency Medicine

## 2013-03-03 ENCOUNTER — Emergency Department (HOSPITAL_COMMUNITY)
Admission: EM | Admit: 2013-03-03 | Discharge: 2013-03-03 | Disposition: A | Discharge status: Home / Self Care | Payer: 59 | Attending: Emergency Medicine | Admitting: Emergency Medicine

## 2013-03-03 ENCOUNTER — Encounter (HOSPITAL_COMMUNITY): Payer: Self-pay

## 2013-03-03 ENCOUNTER — Emergency Department (HOSPITAL_COMMUNITY): Payer: 59

## 2013-03-03 DIAGNOSIS — J45909 Unspecified asthma, uncomplicated: Secondary | ICD-10-CM | POA: Insufficient documentation

## 2013-03-03 DIAGNOSIS — Z791 Long term (current) use of non-steroidal anti-inflammatories (NSAID): Secondary | ICD-10-CM | POA: Insufficient documentation

## 2013-03-03 DIAGNOSIS — R079 Chest pain, unspecified: Secondary | ICD-10-CM

## 2013-03-03 DIAGNOSIS — E119 Type 2 diabetes mellitus without complications: Secondary | ICD-10-CM | POA: Insufficient documentation

## 2013-03-03 DIAGNOSIS — Z8619 Personal history of other infectious and parasitic diseases: Secondary | ICD-10-CM | POA: Insufficient documentation

## 2013-03-03 DIAGNOSIS — E785 Hyperlipidemia, unspecified: Secondary | ICD-10-CM | POA: Insufficient documentation

## 2013-03-03 DIAGNOSIS — Z8661 Personal history of infections of the central nervous system: Secondary | ICD-10-CM | POA: Insufficient documentation

## 2013-03-03 DIAGNOSIS — Z880 Allergy status to penicillin: Secondary | ICD-10-CM | POA: Insufficient documentation

## 2013-03-03 DIAGNOSIS — Z7982 Long term (current) use of aspirin: Secondary | ICD-10-CM | POA: Insufficient documentation

## 2013-03-03 DIAGNOSIS — Z8719 Personal history of other diseases of the digestive system: Secondary | ICD-10-CM | POA: Insufficient documentation

## 2013-03-03 DIAGNOSIS — R0789 Other chest pain: Secondary | ICD-10-CM | POA: Insufficient documentation

## 2013-03-03 DIAGNOSIS — Z872 Personal history of diseases of the skin and subcutaneous tissue: Secondary | ICD-10-CM | POA: Insufficient documentation

## 2013-03-03 DIAGNOSIS — I10 Essential (primary) hypertension: Secondary | ICD-10-CM | POA: Insufficient documentation

## 2013-03-03 DIAGNOSIS — M129 Arthropathy, unspecified: Secondary | ICD-10-CM | POA: Insufficient documentation

## 2013-03-03 DIAGNOSIS — Z794 Long term (current) use of insulin: Secondary | ICD-10-CM | POA: Insufficient documentation

## 2013-03-03 DIAGNOSIS — Z79899 Other long term (current) drug therapy: Secondary | ICD-10-CM | POA: Insufficient documentation

## 2013-03-03 LAB — CBC
HCT: 41.4 % (ref 36.0–46.0)
Hemoglobin: 14.8 g/dL (ref 12.0–15.0)
MCH: 29.9 pg (ref 26.0–34.0)
MCHC: 35.7 g/dL (ref 30.0–36.0)
MCV: 83.6 fL (ref 78.0–100.0)
Platelets: 431 10*3/uL — ABNORMAL HIGH (ref 150–400)
RBC: 4.95 MIL/uL (ref 3.87–5.11)
RDW: 12.8 % (ref 11.5–15.5)
WBC: 10.9 10*3/uL — ABNORMAL HIGH (ref 4.0–10.5)

## 2013-03-03 LAB — BASIC METABOLIC PANEL
BUN: 9 mg/dL (ref 6–23)
CO2: 28 mEq/L (ref 19–32)
Calcium: 9.9 mg/dL (ref 8.4–10.5)
Chloride: 97 mEq/L (ref 96–112)
Creatinine, Ser: 0.51 mg/dL (ref 0.50–1.10)
GFR calc Af Amer: >90 mL/min (ref >90)
GFR calc non Af Amer: >90 mL/min (ref >90)
Glucose, Bld: 261 mg/dL — ABNORMAL HIGH (ref 70–99)
Potassium: 3.8 mEq/L (ref 3.5–5.1)
Sodium: 138 mEq/L (ref 135–145)

## 2013-03-03 LAB — POCT I-STAT TROPONIN I: Troponin i, poc: 0 ng/mL (ref 0.00–0.08)

## 2013-03-03 LAB — D-DIMER, QUANTITATIVE: D-Dimer, Quant: <0.27 ug/mL-FEU (ref 0.00–0.48)

## 2013-03-03 MED ORDER — NAPROXEN 500 MG PO TABS: 500.0000 mg | ORAL_TABLET | Freq: Two times a day (BID) | ORAL | Status: DC

## 2013-03-03 MED ORDER — ASPIRIN 81 MG PO CHEW
324.0000 mg | CHEWABLE_TABLET | Freq: Once | ORAL | Status: AC
  Administered 2013-03-03: 324 mg via ORAL
  Filled 2013-03-03: qty 4

## 2013-03-03 MED ORDER — KETOROLAC TROMETHAMINE 30 MG/ML IJ SOLN
30.0000 mg | Freq: Once | INTRAMUSCULAR | Status: AC
  Administered 2013-03-03: 30 mg via INTRAVENOUS
  Filled 2013-03-03: qty 1

## 2013-03-03 NOTE — ED Notes (Signed)
Patient transported to X-ray 

## 2013-03-03 NOTE — ED Notes (Signed)
Right side chest pain radiates to back x 3 days

## 2013-03-03 NOTE — ED Provider Notes (Signed)
CSN: [id number]     Arrival date & time 03/03/13  1645 History   None    Chief Complaint  Patient presents with  . Chest Pain   (Consider location/radiation/quality/duration/timing/severity/associated sxs/prior Treatment) HPI  Patient has a history of pericarditis in the 1990's.  Patient's cardiologist is Dr. Katrinka Blazing.  Last cardiac catheterization was March of 2013 and negative per patient.   The patient is presenting today with right anterior chest pain underneath her right breast x3 days. Patient states the pain radiates through to her back it is worse with movement and deep breathing. Patient denies any nausea or vomiting but admits to periodic sweating. States that she is postmenopausal x 3-4 years. No history of GERD. Denies recent fever or malaise.   Past Medical History  Diagnosis Date  . Asthma   . Diabetes mellitus   . Arthritis   . Chicken pox   . GERD (gastroesophageal reflux disease)   . Allergy   . Hypertension   . Hyperlipidemia   . Migraine   . Viral meningitis   . Pericarditis   . Finger infection 2008    isolation   Past Surgical History  Procedure Laterality Date  . Breast surgery  2002    breast bx  . Cesarean section      1997, 2000  . Knee arthroscopy  2010    right   Family History  Problem Relation Age of Onset  . Arthritis Other   . Cancer Other     colon,ovary/uterus,breast,prostate  . Hyperlipidemia Other   . Heart disease Other   . Hypertension Other   . Depression Other   . Colon cancer Neg Hx   . Stomach cancer Neg Hx   . Colon polyps Mother    History  Substance Use Topics  . Smoking status: Never Smoker   . Smokeless tobacco: Never Used  . Alcohol Use: No   OB History   Grav Para Term Preterm Abortions TAB SAB Ect Mult Living                 Review of Systems  Constitutional: Negative.   HENT: Negative.   Eyes: Negative.   Respiratory: Negative.   Cardiovascular: Positive for chest pain. Negative for palpitations and leg  swelling.  Gastrointestinal: Negative.   Endocrine: Negative.   Genitourinary: Negative.   Musculoskeletal: Negative.   Skin: Negative.   Allergic/Immunologic: Negative.   Neurological: Negative.   Hematological: Negative.   Psychiatric/Behavioral: Negative.      Allergies  Penicillins and Zithromax  Home Medications   Current Outpatient Rx  Name  Route  Sig  Dispense  Refill  . albuterol (PROVENTIL HFA;VENTOLIN HFA) 108 (90 BASE) MCG/ACT inhaler   Inhalation   Inhale 2 puffs into the lungs every 4 (four) hours as needed. For shortness of breath/wheezing   1 Inhaler   5   . aspirin 81 MG tablet   Oral   Take 81 mg by mouth daily.          Marland Kitchen atorvastatin (LIPITOR) 40 MG tablet   Oral   Take 1 tablet (40 mg total) by mouth daily.   30 tablet   5   . Blood Glucose Monitoring Suppl (FREESTYLE INSULINX SYSTEM) W/DEVICE KIT   Does not apply   by Does not apply route.         Marland Kitchen EPINEPHrine (EPI-PEN) 0.3 mg/0.3 mL DEVI   Intramuscular   Inject 0.3 mg into the muscle daily as needed. For  anaphylaxis         . glucose blood (FREESTYLE INSULINX TEST) test strip      1 each by Other route daily. Use as instructed   360 each   3   . insulin aspart (NOVOLOG FLEXPEN) 100 UNIT/ML injection   Subcutaneous   Inject 30 Units into the skin 3 (three) times daily before meals.   10 mL   3   . Insulin Pen Needle 32G X 4 MM MISC   Does not apply   1 each by Does not apply route 3 (three) times daily.   300 each   3   . Insulin Syringe-Needle U-100 (RELION INSULIN SYRINGE 1ML/31G) 31G X 5/16" 1 ML MISC      3 times a day or as needed   300 each   3   . LEVEMIR FLEXPEN 100 UNIT/ML injection      Inject subcutaneously 60  units at bedtime   60 mL   2   . meclizine (ANTIVERT) 50 MG tablet   Oral   Take 1 tablet (50 mg total) by mouth 3 (three) times daily as needed for dizziness or nausea.   30 tablet   0   . metformin (FORTAMET) 500 MG (OSM) 24 hr tablet    Oral   Take 1 tablet (500 mg total) by mouth daily with breakfast.   90 tablet   5   . montelukast (SINGULAIR) 10 MG tablet   Oral   Take 10 mg by mouth at bedtime.         . naproxen sodium (ANAPROX) 220 MG tablet   Oral   Take 220 mg by mouth daily.          Letta Pate DELICA LANCETS 33G MISC   Does not apply   1 each by Does not apply route daily as needed.   300 each   3     Dx: 250.00   . furosemide (LASIX) 20 MG tablet   Oral   Take 20 mg by mouth daily.          There were no vitals taken for this visit.  Physical Exam  Nursing note and vitals reviewed. Constitutional: She is oriented to person, place, and time. She appears well-developed and well-nourished. No distress.  HENT:  Head: Normocephalic and atraumatic.  Neck: Normal range of motion. Neck supple.  Cardiovascular: Normal rate, regular rhythm and intact distal pulses.  Exam reveals no gallop and no friction rub.   Murmur heard. Patient has a grade 3 of 6 systolic murmur best heard over pulmonic region.  Pulmonary/Chest: Effort normal and breath sounds normal.  Difficult to evaluate bases of patient's lungs secondary to patient discomfort. Patient unable to take a full deep breath.  Neurological: She is alert and oriented to person, place, and time.  Skin: Skin is warm and dry. No rash noted. She is not diaphoretic. No erythema. No pallor.  Psychiatric: She has a normal mood and affect. Her behavior is normal.    ED Course  Procedures (including critical care time) Labs Review Labs Reviewed - No data to display Imaging Review Dg Chest 2 View  03/03/2013   CLINICAL DATA:  Chest pain and shortness of breath  EXAM: CHEST  2 VIEW  COMPARISON:  August 04, 2010  FINDINGS: There is no edema or consolidation. Heart is upper normal in size with normal pulmonary vascularity. No adenopathy. No pneumothorax. No bone lesions. .  IMPRESSION: No edema or  consolidation.   Electronically Signed   By: Bretta Bang   On: 03/03/2013 18:54    MDM   1. Chest pain    Plan of care discussed with Dr. Artis Flock. Patient to be transferred to [facility] ED for further evaluation.      Weber Cooks, NP 03/04/13 2019

## 2013-03-03 NOTE — ED Notes (Signed)
Pt c/o right sided CP with radiation through to back and SOB x 3 days

## 2013-03-03 NOTE — ED Provider Notes (Signed)
CSN: [id number]     Arrival date & time 03/03/13  1729 History   First MD Initiated Contact with Patient 03/03/13 1807     Chief Complaint  Patient presents with  . Chest Pain   (Consider location/radiation/quality/duration/timing/severity/associated sxs/prior Treatment) Patient is a 52 y.o. female presenting with chest pain.  Chest Pain  Pt with history of pericarditis reports 4 days of moderate to severe aching R sided chest pain, radiating into her R scapular area, worse with deep breath, not associated with fever, cough, N/V/D or SOB except for inability to take deep breath. She denies falls or injury. Seen at [facility] and sent to the ED for further evaluation. She had similar symptoms in Mar 2012, had abnormal stress test and so taken for Cardiac Cath which was normal.   Past Medical History  Diagnosis Date  . Asthma   . Diabetes mellitus   . Arthritis   . Chicken pox   . GERD (gastroesophageal reflux disease)   . Allergy   . Hypertension   . Hyperlipidemia   . Migraine   . Viral meningitis   . Pericarditis   . Finger infection 2008    isolation   Past Surgical History  Procedure Laterality Date  . Breast surgery  2002    breast bx  . Cesarean section      1997, 2000  . Knee arthroscopy  2010    right   Family History  Problem Relation Age of Onset  . Arthritis Other   . Cancer Other     colon,ovary/uterus,breast,prostate  . Hyperlipidemia Other   . Heart disease Other   . Hypertension Other   . Depression Other   . Colon cancer Neg Hx   . Stomach cancer Neg Hx   . Colon polyps Mother    History  Substance Use Topics  . Smoking status: Never Smoker   . Smokeless tobacco: Never Used  . Alcohol Use: No   OB History   Grav Para Term Preterm Abortions TAB SAB Ect Mult Living                 Review of Systems  Cardiovascular: Positive for chest pain.   All other systems reviewed and are negative except as noted in HPI.   Allergies  Penicillins and  Zithromax  Home Medications   Current Outpatient Rx  Name  Route  Sig  Dispense  Refill  . albuterol (PROVENTIL HFA;VENTOLIN HFA) 108 (90 BASE) MCG/ACT inhaler   Inhalation   Inhale 2 puffs into the lungs every 4 (four) hours as needed. For shortness of breath/wheezing   1 Inhaler   5   . aspirin 81 MG tablet   Oral   Take 162 mg by mouth daily.          Marland Kitchen atorvastatin (LIPITOR) 40 MG tablet   Oral   Take 1 tablet (40 mg total) by mouth daily.   30 tablet   5   . Blood Glucose Monitoring Suppl (FREESTYLE INSULINX SYSTEM) W/DEVICE KIT   Does not apply   by Does not apply route.         Marland Kitchen EPINEPHrine (EPI-PEN) 0.3 mg/0.3 mL DEVI   Intramuscular   Inject 0.3 mg into the muscle daily as needed. For anaphylaxis         . furosemide (LASIX) 20 MG tablet   Oral   Take 1 tablet (20 mg total) by mouth daily as needed. For fluid retention   30 tablet  2   . glucose blood (FREESTYLE INSULINX TEST) test strip      1 each by Other route daily. Use as instructed   360 each   3   . insulin aspart (NOVOLOG FLEXPEN) 100 UNIT/ML injection   Subcutaneous   Inject 30 Units into the skin 3 (three) times daily before meals.   10 mL   3   . Insulin Pen Needle 32G X 4 MM MISC   Does not apply   1 each by Does not apply route 3 (three) times daily.   300 each   3   . Insulin Syringe-Needle U-100 (RELION INSULIN SYRINGE 1ML/31G) 31G X 5/16" 1 ML MISC      3 times a day or as needed   300 each   3   . LEVEMIR FLEXPEN 100 UNIT/ML injection      Inject subcutaneously 60  units at bedtime   60 mL   2   . meclizine (ANTIVERT) 50 MG tablet   Oral   Take 1 tablet (50 mg total) by mouth 3 (three) times daily as needed for dizziness or nausea.   30 tablet   0   . metformin (FORTAMET) 500 MG (OSM) 24 hr tablet   Oral   Take 1 tablet (500 mg total) by mouth daily with breakfast.   90 tablet   5   . montelukast (SINGULAIR) 10 MG tablet   Oral   Take 10 mg by mouth at  bedtime.         . naproxen sodium (ANAPROX) 220 MG tablet   Oral   Take 220 mg by mouth 2 (two) times daily.          Letta Pate DELICA LANCETS 33G MISC   Does not apply   1 each by Does not apply route daily as needed.   300 each   3     Dx: 250.00    BP 125/70  Pulse 90  Temp(Src) 98.3 F (36.8 C) (Oral)  Resp 21  SpO2 98% Physical Exam  Nursing note and vitals reviewed. Constitutional: She is oriented to person, place, and time. She appears well-developed and well-nourished.  HENT:  Head: Normocephalic and atraumatic.  Eyes: EOM are normal. Pupils are equal, round, and reactive to light.  Neck: Normal range of motion. Neck supple.  Cardiovascular: Normal rate, normal heart sounds and intact distal pulses.   Pulmonary/Chest: Effort normal and breath sounds normal. No respiratory distress. She has no wheezes. She has no rales. She exhibits tenderness (R sided chest wall).  Abdominal: Bowel sounds are normal. She exhibits no distension. There is no tenderness.  Musculoskeletal: Normal range of motion. She exhibits no edema and no tenderness.  Neurological: She is alert and oriented to person, place, and time. She has normal strength. No cranial nerve deficit or sensory deficit.  Skin: Skin is warm and dry. No rash noted.  Psychiatric: She has a normal mood and affect.    ED Course  Procedures (including critical care time) Labs Review Labs Reviewed  CBC - Abnormal; Notable for the following:    WBC 10.9 (*)    Platelets 431 (*)    All other components within normal limits  BASIC METABOLIC PANEL - Abnormal; Notable for the following:    Glucose, Bld 261 (*)    All other components within normal limits  D-DIMER, QUANTITATIVE  POCT I-STAT TROPONIN I   Imaging Review Dg Chest 2 View  03/03/2013   CLINICAL  DATA:  Chest pain and shortness of breath  EXAM: CHEST  2 VIEW  COMPARISON:  August 04, 2010  FINDINGS: There is no edema or consolidation. Heart is upper  normal in size with normal pulmonary vascularity. No adenopathy. No pneumothorax. No bone lesions. .  IMPRESSION: No edema or consolidation.   Electronically Signed   By: Bretta Bang   On: 03/03/2013 18:54    MDM  No diagnosis found.   Date: 03/03/2013  Rate: 94  Rhythm: normal sinus rhythm  QRS Axis: normal  Intervals: normal  ST/T Wave abnormalities: nonspecific ST/T changes  Conduction Disutrbances:none  Narrative Interpretation:   Old EKG Reviewed: unchanged  8:35 PM Labs and imaging reviewed and unremarkable. Pt with reproducible R sided chest pain ongoing for several days. Doubt this is ACS. Dimer neg in low risk patient. CXR is clear and EKG unchanged from previous. Discussed admission vs outpatient follow up with the patient and she would prefer to see PCP. I think this is appropriate. Will give NSAIDs. Advised to return for any worsening.     Charles B. Bernette Mayers, MD 03/03/13 2037

## 2013-03-03 NOTE — ED Notes (Signed)
Patient returned from X-ray 

## 2013-03-07 NOTE — ED Provider Notes (Signed)
Medical screening examination/treatment/procedure(s) were performed by resident physician or non-physician practitioner and as supervising physician I was immediately available for consultation/collaboration.   Barkley Bruns MD.   Linna Hoff, MD 03/07/13 [id number]

## 2013-03-09 ENCOUNTER — Ambulatory Visit: Payer: 59 | Admitting: Internal Medicine

## 2013-03-09 ENCOUNTER — Telehealth: Payer: Self-pay | Admitting: Internal Medicine

## 2013-03-09 NOTE — Telephone Encounter (Signed)
Pt had to can OV today, but needs 30 at end of day. These are Same day. Is it ok to use one next week or the next?

## 2013-03-09 NOTE — Telephone Encounter (Signed)
Yes, that is fine. 

## 2013-03-09 NOTE — Telephone Encounter (Signed)
lmom for pt's appt time and date on personalized VM. Ok per pt.

## 2013-03-15 ENCOUNTER — Ambulatory Visit (INDEPENDENT_AMBULATORY_CARE_PROVIDER_SITE_OTHER): Payer: 59 | Admitting: Internal Medicine

## 2013-03-15 ENCOUNTER — Encounter: Payer: Self-pay | Admitting: Internal Medicine

## 2013-03-15 VITALS — BP 120/80 | HR 97 | Temp 98.3°F | Resp 20 | Wt 168.0 lb

## 2013-03-15 DIAGNOSIS — J45909 Unspecified asthma, uncomplicated: Secondary | ICD-10-CM

## 2013-03-15 DIAGNOSIS — R079 Chest pain, unspecified: Secondary | ICD-10-CM

## 2013-03-15 DIAGNOSIS — E7800 Pure hypercholesterolemia, unspecified: Secondary | ICD-10-CM

## 2013-03-15 DIAGNOSIS — E119 Type 2 diabetes mellitus without complications: Secondary | ICD-10-CM

## 2013-03-15 MED ORDER — INSULIN DETEMIR 100 UNIT/ML ~~LOC~~ SOLN: SUBCUTANEOUS | Status: DC

## 2013-03-15 NOTE — Progress Notes (Signed)
Subjective:    Patient ID: Renee Lam, female    DOB: 1961/01/17, 52 y.o.   MRN: [id number]  HPI  52 year old patient who is seen today for followup. She has been seen in the ED recently for nonspecific chest pain. Pain is aggravated by deep inspiration. The patient has been promoted to a new job which has been quite intense and stressful.  There have been some time limitation and she has not been compliant with her mealtime insulin. Her recent hemoglobin A1c was 10.5.  ED records reviewed.  She has been on Levemir 16 units at bedtime with fasting blood sugars between 150 and 200. Again has not been taking mealtime insulin  Past Medical History  Diagnosis Date  . Asthma   . Diabetes mellitus   . Arthritis   . Chicken pox   . GERD (gastroesophageal reflux disease)   . Allergy   . Hypertension   . Hyperlipidemia   . Migraine   . Viral meningitis   . Pericarditis   . Finger infection 2008    isolation    History   Social History  . Marital Status: Divorced    Spouse Name: N/A    Number of Children: N/A  . Years of Education: N/A   Occupational History  . Not on file.   Social History Main Topics  . Smoking status: Never Smoker   . Smokeless tobacco: Never Used  . Alcohol Use: No  . Drug Use: No  . Sexual Activity: Not on file   Other Topics Concern  . Not on file   Social History Narrative  . No narrative on file    Past Surgical History  Procedure Laterality Date  . Breast surgery  2002    breast bx  . Cesarean section      1997, 2000  . Knee arthroscopy  2010    right    Family History  Problem Relation Age of Onset  . Arthritis Other   . Cancer Other     colon,ovary/uterus,breast,prostate  . Hyperlipidemia Other   . Heart disease Other   . Hypertension Other   . Depression Other   . Colon cancer Neg Hx   . Stomach cancer Neg Hx   . Colon polyps Mother     Allergies  Allergen Reactions  . Penicillins Hives  . Zithromax  [Azithromycin] Hives    z-max; states can take Z pack    Current Outpatient Prescriptions on File Prior to Visit  Medication Sig Dispense Refill  . albuterol (PROVENTIL HFA;VENTOLIN HFA) 108 (90 BASE) MCG/ACT inhaler Inhale 2 puffs into the lungs every 4 (four) hours as needed. For shortness of breath/wheezing  1 Inhaler  5  . aspirin 81 MG tablet Take 81 mg by mouth daily.       Marland Kitchen atorvastatin (LIPITOR) 40 MG tablet Take 1 tablet (40 mg total) by mouth daily.  30 tablet  5  . Blood Glucose Monitoring Suppl (FREESTYLE INSULINX SYSTEM) W/DEVICE KIT by Does not apply route.      Marland Kitchen EPINEPHrine (EPI-PEN) 0.3 mg/0.3 mL DEVI Inject 0.3 mg into the muscle daily as needed. For anaphylaxis      . furosemide (LASIX) 20 MG tablet Take 20 mg by mouth daily.      Marland Kitchen glucose blood (FREESTYLE INSULINX TEST) test strip 1 each by Other route daily. Use as instructed  360 each  3  . Insulin Pen Needle 32G X 4 MM MISC 1 each by Does  not apply route 3 (three) times daily.  300 each  3  . Insulin Syringe-Needle U-100 (RELION INSULIN SYRINGE 1ML/31G) 31G X 5/16" 1 ML MISC 3 times a day or as needed  300 each  3  . meclizine (ANTIVERT) 50 MG tablet Take 1 tablet (50 mg total) by mouth 3 (three) times daily as needed for dizziness or nausea.  30 tablet  0  . metformin (FORTAMET) 500 MG (OSM) 24 hr tablet Take 1 tablet (500 mg total) by mouth daily with breakfast.  90 tablet  5  . montelukast (SINGULAIR) 10 MG tablet Take 10 mg by mouth at bedtime.      . naproxen (NAPROSYN) 500 MG tablet Take 1 tablet (500 mg total) by mouth 2 (two) times daily with a meal.  30 tablet  0  . ONETOUCH DELICA LANCETS 33G MISC 1 each by Does not apply route daily as needed.  300 each  3  . insulin aspart (NOVOLOG FLEXPEN) 100 UNIT/ML injection Inject 30 Units into the skin 3 (three) times daily before meals.  10 mL  3   No current facility-administered medications on file prior to visit.    BP 120/80  Pulse 97  Temp(Src) 98.3 F (36.8  C) (Oral)  Resp 20  Wt 168 lb (76.204 kg)  BMI 33.91 kg/m2  SpO2 97%       Review of Systems  Constitutional: Negative.   HENT: Negative for hearing loss, congestion, sore throat, rhinorrhea, dental problem, sinus pressure and tinnitus.   Eyes: Negative for pain, discharge and visual disturbance.  Respiratory: Negative for cough and shortness of breath.   Cardiovascular: Positive for chest pain. Negative for palpitations and leg swelling.  Gastrointestinal: Negative for nausea, vomiting, abdominal pain, diarrhea, constipation, blood in stool and abdominal distention.  Genitourinary: Negative for dysuria, urgency, frequency, hematuria, flank pain, vaginal bleeding, vaginal discharge, difficulty urinating, vaginal pain and pelvic pain.  Musculoskeletal: Negative for joint swelling, arthralgias and gait problem.  Skin: Negative for rash.  Neurological: Negative for dizziness, syncope, speech difficulty, weakness, numbness and headaches.  Hematological: Negative for adenopathy.  Psychiatric/Behavioral: Positive for decreased concentration. Negative for behavioral problems, dysphoric mood and agitation. The patient is nervous/anxious.        Objective:   Physical Exam  Constitutional: She is oriented to person, place, and time. She appears well-developed and well-nourished.  HENT:  Head: Normocephalic.  Right Ear: External ear normal.  Left Ear: External ear normal.  Mouth/Throat: Oropharynx is clear and moist.  Eyes: Conjunctivae and EOM are normal. Pupils are equal, round, and reactive to light.  Neck: Normal range of motion. Neck supple. No thyromegaly present.  Cardiovascular: Normal rate, regular rhythm, normal heart sounds and intact distal pulses.   Pulmonary/Chest: Effort normal and breath sounds normal.  Abdominal: Soft. Bowel sounds are normal. She exhibits no mass. There is no tenderness.  Musculoskeletal: Normal range of motion.  Lymphadenopathy:    She has no  cervical adenopathy.  Neurological: She is alert and oriented to person, place, and time.  Skin: Skin is warm and dry. No rash noted.  Psychiatric: She has a normal mood and affect. Her behavior is normal.          Assessment & Plan:   Atypical chest pain. Diabetes mellitus poorly controlled. Compliance issues stressed. She will be resumed on mealtime insulin. If after several days fasting blood sugars remain greater than 150 we'll increase Levemir to 70 units Hypertension stable Dyslipidemia. Recent total cholesterol 227.  Continue statin therapy  Recheck 6 weeks

## 2013-03-15 NOTE — Patient Instructions (Signed)
Limit your sodium (Salt) intake    It is important that you exercise regularly, at least 20 minutes 3 to 4 times per week.  If you develop chest pain or shortness of breath seek  medical attention.  You need to lose weight.  Consider a lower calorie diet and regular exercise.  Return in 6 weeks for followup

## 2013-03-21 ENCOUNTER — Other Ambulatory Visit: Payer: Self-pay | Admitting: *Deleted

## 2013-03-21 MED ORDER — INSULIN DETEMIR 100 UNIT/ML FLEXPEN: 70.0000 [IU] | PEN_INJECTOR | Freq: Every day | SUBCUTANEOUS | Status: DC

## 2013-04-06 ENCOUNTER — Ambulatory Visit: Payer: 59 | Admitting: Internal Medicine

## 2013-04-18 ENCOUNTER — Other Ambulatory Visit: Payer: Self-pay

## 2013-04-18 DIAGNOSIS — Z1231 Encounter for screening mammogram for malignant neoplasm of breast: Secondary | ICD-10-CM

## 2013-04-20 ENCOUNTER — Ambulatory Visit: Payer: 59 | Admitting: Internal Medicine

## 2013-04-20 DIAGNOSIS — Z0289 Encounter for other administrative examinations: Secondary | ICD-10-CM

## 2013-04-30 ENCOUNTER — Ambulatory Visit (INDEPENDENT_AMBULATORY_CARE_PROVIDER_SITE_OTHER): Payer: 59 | Admitting: Ophthalmology

## 2013-04-30 DIAGNOSIS — H35039 Hypertensive retinopathy, unspecified eye: Secondary | ICD-10-CM

## 2013-04-30 DIAGNOSIS — E1139 Type 2 diabetes mellitus with other diabetic ophthalmic complication: Secondary | ICD-10-CM

## 2013-04-30 DIAGNOSIS — E11319 Type 2 diabetes mellitus with unspecified diabetic retinopathy without macular edema: Secondary | ICD-10-CM

## 2013-04-30 DIAGNOSIS — I10 Essential (primary) hypertension: Secondary | ICD-10-CM

## 2013-04-30 DIAGNOSIS — H35369 Drusen (degenerative) of macula, unspecified eye: Secondary | ICD-10-CM

## 2013-04-30 DIAGNOSIS — H43819 Vitreous degeneration, unspecified eye: Secondary | ICD-10-CM

## 2013-04-30 DIAGNOSIS — H2510 Age-related nuclear cataract, unspecified eye: Secondary | ICD-10-CM

## 2013-05-07 ENCOUNTER — Telehealth: Payer: Self-pay | Admitting: Internal Medicine

## 2013-05-07 NOTE — Telephone Encounter (Addendum)
Pt cancelled her appt for 11/7,and has resc but states she needs labs done prior to her appt on 12/24. Pt states she works at [employer] and would only need an order for a1c, lipid panal and cmp/ pls advise.

## 2013-05-08 NOTE — Telephone Encounter (Signed)
Spoke to pt told her have lab order ready for her she can pick up at the front desk. Pt verbalized understanding.

## 2013-05-15 ENCOUNTER — Ambulatory Visit: Payer: 59

## 2013-05-31 ENCOUNTER — Encounter: Payer: Self-pay | Admitting: Physician Assistant

## 2013-05-31 ENCOUNTER — Ambulatory Visit (INDEPENDENT_AMBULATORY_CARE_PROVIDER_SITE_OTHER): Payer: 59 | Admitting: Physician Assistant

## 2013-05-31 VITALS — BP 116/74 | HR 102 | Temp 98.1°F | Resp 16 | Ht 59.0 in | Wt 166.0 lb

## 2013-05-31 DIAGNOSIS — J209 Acute bronchitis, unspecified: Secondary | ICD-10-CM | POA: Insufficient documentation

## 2013-05-31 DIAGNOSIS — J45909 Unspecified asthma, uncomplicated: Secondary | ICD-10-CM

## 2013-05-31 MED ORDER — LEVOFLOXACIN 500 MG PO TABS: 500.0000 mg | ORAL_TABLET | Freq: Every day | ORAL | Status: DC

## 2013-05-31 MED ORDER — ALBUTEROL SULFATE HFA 108 (90 BASE) MCG/ACT IN AERS: 2.0000 | INHALATION_SPRAY | RESPIRATORY_TRACT | Status: DC | PRN

## 2013-05-31 MED ORDER — HYDROCODONE-HOMATROPINE 5-1.5 MG/5ML PO SYRP: 5.0000 mL | ORAL_SOLUTION | Freq: Four times a day (QID) | ORAL | Status: DC | PRN

## 2013-05-31 NOTE — Assessment & Plan Note (Signed)
No significant wheeze on exam. Continue albuterol. Rest. Rx Levaquin. Continue allergy medications. Hycodan for cough. Please humidifier in bedroom. Saline nasal spray. Probiotic. Please call or return to clinic if symptoms are not improving.

## 2013-05-31 NOTE — Patient Instructions (Signed)
Take antibiotic as prescribed.  Use albuterol inhaler every 4-6 hours.  Increase fluid intake.  Rest.  Saline nasal spray.  Probiotic.  Place humidifier in bedroom.  Cough syrup as needed for cough.  Tylenol or chloraseptic for throat pain.

## 2013-05-31 NOTE — Progress Notes (Signed)
Pre visit review using our clinic review tool, if applicable. No additional management support is needed unless otherwise documented below in the visit note/SLS  

## 2013-05-31 NOTE — Progress Notes (Signed)
Patient ID: Renee Lam, female   DOB: 09-23-1960, 52 y.o.   MRN: [id number]  Patient with a history of asthma who presents to clinic today complaining of 2 days of worsening chest congestion, chest tightness, wheezing, cough productive of sputum. Patient endorses transient fevers. Is taking albuterol as prescribed. Denies recent travel or sick contact. Is concerned because she has had pneumonia in the past. Patient also a diabetic and endorses her blood sugar is not controlled at present.  Past Medical History  Diagnosis Date  . Asthma   . Diabetes mellitus   . Arthritis   . Chicken pox   . GERD (gastroesophageal reflux disease)   . Allergy   . Hypertension   . Hyperlipidemia   . Migraine   . Viral meningitis   . Pericarditis   . Finger infection 2008    isolation    Current Outpatient Prescriptions on File Prior to Visit  Medication Sig Dispense Refill  . aspirin 81 MG tablet Take 81 mg by mouth daily.       Marland Kitchen atorvastatin (LIPITOR) 40 MG tablet Take 1 tablet (40 mg total) by mouth daily.  30 tablet  5  . Blood Glucose Monitoring Suppl (FREESTYLE INSULINX SYSTEM) W/DEVICE KIT by Does not apply route.      Marland Kitchen EPINEPHrine (EPI-PEN) 0.3 mg/0.3 mL DEVI Inject 0.3 mg into the muscle daily as needed. For anaphylaxis      . furosemide (LASIX) 20 MG tablet Take 20 mg by mouth daily.      Marland Kitchen glucose blood (FREESTYLE INSULINX TEST) test strip 1 each by Other route daily. Use as instructed  360 each  3  . insulin aspart (NOVOLOG FLEXPEN) 100 UNIT/ML injection Inject 30 Units into the skin 3 (three) times daily before meals.  10 mL  3  . Insulin Detemir (LEVEMIR FLEXTOUCH) 100 UNIT/ML SOPN Inject 70 Units into the skin at bedtime.  25 pen  1  . Insulin Pen Needle 32G X 4 MM MISC 1 each by Does not apply route 3 (three) times daily.  300 each  3  . Insulin Syringe-Needle U-100 (RELION INSULIN SYRINGE 1ML/31G) 31G X 5/16" 1 ML MISC 3 times a day or as needed  300 each  3  . meclizine  (ANTIVERT) 50 MG tablet Take 1 tablet (50 mg total) by mouth 3 (three) times daily as needed for dizziness or nausea.  30 tablet  0  . metformin (FORTAMET) 500 MG (OSM) 24 hr tablet Take 1 tablet (500 mg total) by mouth daily with breakfast.  90 tablet  5  . montelukast (SINGULAIR) 10 MG tablet Take 10 mg by mouth 3 times/day as needed-between meals & bedtime.       . naproxen (NAPROSYN) 500 MG tablet Take 1 tablet (500 mg total) by mouth 2 (two) times daily with a meal.  30 tablet  0  . ONETOUCH DELICA LANCETS 33G MISC 1 each by Does not apply route daily as needed.  300 each  3   No current facility-administered medications on file prior to visit.    Allergies  Allergen Reactions  . Penicillins Hives  . Zithromax [Azithromycin] Hives    z-max; states can take Z pack    Family History  Problem Relation Age of Onset  . Arthritis Other   . Cancer Other     colon,ovary/uterus,breast,prostate  . Hyperlipidemia Other   . Heart disease Other   . Hypertension Other   . Depression Other   .  Colon cancer Neg Hx   . Stomach cancer Neg Hx   . Colon polyps Mother     History   Social History  . Marital Status: Divorced    Spouse Name: N/A    Number of Children: N/A  . Years of Education: N/A   Social History Main Topics  . Smoking status: Never Smoker   . Smokeless tobacco: Never Used  . Alcohol Use: No  . Drug Use: No  . Sexual Activity: None   Other Topics Concern  . None   Social History Narrative  . None    Review of Systems - see history of present illness. All other review of systems are negative.   Filed Vitals:   05/31/13 1535  BP: 116/74  Pulse: 102  Temp: 98.1 F (36.7 C)  Resp: 16   Physical Exam  Vitals reviewed. Constitutional: She is oriented to person, place, and time and well-developed, well-nourished, and in no distress.  HENT:  Head: Normocephalic and atraumatic.  Right Ear: External ear normal.  Left Ear: External ear normal.  Nose: Nose  normal.  Mouth/Throat: Oropharynx is clear and moist. No oropharyngeal exudate.  Tympanic membranes within normal limits bilaterally.  Eyes: Conjunctivae are normal.  Neck: Neck supple.  Cardiovascular: Normal rate, regular rhythm, normal heart sounds and intact distal pulses.   Pulmonary/Chest: Effort normal and breath sounds normal. No respiratory distress. She has no wheezes. She has no rales. She exhibits no tenderness.  Lymphadenopathy:    She has no cervical adenopathy.  Neurological: She is alert and oriented to person, place, and time.  Skin: Skin is warm and dry. No rash noted.  Psychiatric: Affect normal.     Recent Results (from the past 2160 hour(s))  CBC     Status: Abnormal   Collection Time    03/03/13  5:50 PM      Result Value Range   WBC 10.9 (*) 4.0 - 10.5 K/uL   RBC 4.95  3.87 - 5.11 MIL/uL   Hemoglobin 14.8  12.0 - 15.0 g/dL   HCT 16.1  09.6 - 04.5 %   MCV 83.6  78.0 - 100.0 fL   MCH 29.9  26.0 - 34.0 pg   MCHC 35.7  30.0 - 36.0 g/dL   RDW 40.9  81.1 - 91.4 %   Platelets 431 (*) 150 - 400 K/uL  BASIC METABOLIC PANEL     Status: Abnormal   Collection Time    03/03/13  5:50 PM      Result Value Range   Sodium 138  135 - 145 mEq/L   Potassium 3.8  3.5 - 5.1 mEq/L   Chloride 97  96 - 112 mEq/L   CO2 28  19 - 32 mEq/L   Glucose, Bld 261 (*) 70 - 99 mg/dL   BUN 9  6 - 23 mg/dL   Creatinine, Ser 7.82  0.50 - 1.10 mg/dL   Calcium 9.9  8.4 - 95.6 mg/dL   GFR calc non Af Amer >90  >90 mL/min   GFR calc Af Amer >90  >90 mL/min   Comment: (NOTE)     The eGFR has been calculated using the CKD EPI equation.     This calculation has not been validated in all clinical situations.     eGFR's persistently <90 mL/min signify possible Chronic Kidney     Disease.  POCT I-STAT TROPONIN I     Status: None   Collection Time    03/03/13  5:58 PM      Result Value Range   Troponin i, poc 0.00  0.00 - 0.08 ng/mL   Comment 3            Comment: Due to the release  kinetics of cTnI,     a negative result within the first hours     of the onset of symptoms does not rule out     myocardial infarction with certainty.     If myocardial infarction is still suspected,     repeat the test at appropriate intervals.  D-DIMER, QUANTITATIVE     Status: None   Collection Time    03/03/13  6:30 PM      Result Value Range   D-Dimer, Quant <0.27  0.00 - 0.48 ug/mL-FEU   Comment:            AT THE INHOUSE ESTABLISHED CUTOFF     VALUE OF 0.48 ug/mL FEU,     THIS ASSAY HAS BEEN DOCUMENTED     IN THE LITERATURE TO HAVE     A SENSITIVITY AND NEGATIVE     PREDICTIVE VALUE OF AT LEAST     98 TO 99%.  THE TEST RESULT     SHOULD BE CORRELATED WITH     AN ASSESSMENT OF THE CLINICAL     PROBABILITY OF DVT / VTE.    Assessment/Plan: Acute bronchitis with asthma No significant wheeze on exam. Continue albuterol. Rest. Rx Levaquin. Continue allergy medications. Hycodan for cough. Please humidifier in bedroom. Saline nasal spray. Probiotic. Please call or return to clinic if symptoms are not improving.

## 2013-06-06 ENCOUNTER — Encounter: Payer: Self-pay | Admitting: Internal Medicine

## 2013-06-06 ENCOUNTER — Ambulatory Visit (INDEPENDENT_AMBULATORY_CARE_PROVIDER_SITE_OTHER): Payer: 59 | Admitting: Internal Medicine

## 2013-06-06 VITALS — BP 140/80 | Temp 98.3°F | Wt 169.0 lb

## 2013-06-06 DIAGNOSIS — E119 Type 2 diabetes mellitus without complications: Secondary | ICD-10-CM

## 2013-06-06 DIAGNOSIS — J45909 Unspecified asthma, uncomplicated: Secondary | ICD-10-CM

## 2013-06-06 DIAGNOSIS — E7800 Pure hypercholesterolemia, unspecified: Secondary | ICD-10-CM

## 2013-06-06 DIAGNOSIS — J209 Acute bronchitis, unspecified: Secondary | ICD-10-CM

## 2013-06-06 MED ORDER — DAPAGLIFLOZIN PROPANEDIOL 10 MG PO TABS: 1.0000 | ORAL_TABLET | Freq: Every day | ORAL | Status: DC

## 2013-06-06 MED ORDER — INSULIN DETEMIR 100 UNIT/ML FLEXPEN: 70.0000 [IU] | PEN_INJECTOR | Freq: Every day | SUBCUTANEOUS | Status: DC

## 2013-06-06 MED ORDER — INSULIN ASPART 100 UNIT/ML ~~LOC~~ SOLN: SUBCUTANEOUS | Status: DC

## 2013-06-06 NOTE — Progress Notes (Signed)
Subjective:    Patient ID: Renee Lam, female    DOB: [birth year], 52 y.o.   MRN: [id number]  HPI  Pre-visit discussion using our clinic review tool. No additional management support is needed unless otherwise documented below in the visit note.  52 year old patient who is seen today for followup. She was seen about one week ago for a URI and is completing antibiotic therapy. She has asthma and has noticed increased albuterol use. She has a history of diabetes and has been on basal bolus insulin. Hemoglobin A1c's have been poorly controlled Prior to her illness she states fasting blood sugars are generally in the 140 range presently in the 190 range. Fasting blood sugars are never less than 100. She generally checks fasting blood sugars only  Past Medical History  Diagnosis Date  . Asthma   . Diabetes mellitus   . Arthritis   . Chicken pox   . GERD (gastroesophageal reflux disease)   . Allergy   . Hypertension   . Hyperlipidemia   . Migraine   . Viral meningitis   . Pericarditis   . Finger infection 2008    isolation    History   Social History  . Marital Status: Divorced    Spouse Name: N/A    Number of Children: N/A  . Years of Education: N/A   Occupational History  . Not on file.   Social History Main Topics  . Smoking status: Never Smoker   . Smokeless tobacco: Never Used  . Alcohol Use: No  . Drug Use: No  . Sexual Activity: Not on file   Other Topics Concern  . Not on file   Social History Narrative  . No narrative on file    Past Surgical History  Procedure Laterality Date  . Breast surgery  2002    breast bx  . Cesarean section      1997, 2000  . Knee arthroscopy  2010    right    Family History  Problem Relation Age of Onset  . Arthritis Other   . Cancer Other     colon,ovary/uterus,breast,prostate  . Hyperlipidemia Other   . Heart disease Other   . Hypertension Other   . Depression Other   . Colon cancer Neg Hx   . Stomach  cancer Neg Hx   . Colon polyps Mother     Allergies  Allergen Reactions  . Penicillins Hives  . Zithromax [Azithromycin] Hives    z-max; states can take Z pack    Current Outpatient Prescriptions on File Prior to Visit  Medication Sig Dispense Refill  . albuterol (PROVENTIL HFA;VENTOLIN HFA) 108 (90 BASE) MCG/ACT inhaler Inhale 2 puffs into the lungs every 4 (four) hours as needed. For shortness of breath/wheezing  1 Inhaler  5  . aspirin 81 MG tablet Take 81 mg by mouth daily.       Marland Kitchen atorvastatin (LIPITOR) 40 MG tablet Take 1 tablet (40 mg total) by mouth daily.  30 tablet  5  . Blood Glucose Monitoring Suppl (FREESTYLE INSULINX SYSTEM) W/DEVICE KIT by Does not apply route.      Marland Kitchen EPINEPHrine (EPI-PEN) 0.3 mg/0.3 mL DEVI Inject 0.3 mg into the muscle daily as needed. For anaphylaxis      . furosemide (LASIX) 20 MG tablet Take 20 mg by mouth daily.      Marland Kitchen glucose blood (FREESTYLE INSULINX TEST) test strip 1 each by Other route daily. Use as instructed  360 each  3  . HYDROcodone-homatropine (HYCODAN) 5-1.5 MG/5ML syrup Take 5 mLs by mouth every 6 (six) hours as needed for cough.  120 mL  0  . insulin aspart (NOVOLOG FLEXPEN) 100 UNIT/ML injection Inject 30 Units into the skin 3 (three) times daily before meals.  10 mL  3  . Insulin Detemir (LEVEMIR FLEXTOUCH) 100 UNIT/ML SOPN Inject 70 Units into the skin at bedtime.  25 pen  1  . Insulin Pen Needle 32G X 4 MM MISC 1 each by Does not apply route 3 (three) times daily.  300 each  3  . Insulin Syringe-Needle U-100 (RELION INSULIN SYRINGE 1ML/31G) 31G X 5/16" 1 ML MISC 3 times a day or as needed  300 each  3  . levofloxacin (LEVAQUIN) 500 MG tablet Take 1 tablet (500 mg total) by mouth daily.  7 tablet  0  . meclizine (ANTIVERT) 50 MG tablet Take 1 tablet (50 mg total) by mouth 3 (three) times daily as needed for dizziness or nausea.  30 tablet  0  . metformin (FORTAMET) 500 MG (OSM) 24 hr tablet Take 1 tablet (500 mg total) by mouth daily  with breakfast.  90 tablet  5  . montelukast (SINGULAIR) 10 MG tablet Take 10 mg by mouth 3 times/day as needed-between meals & bedtime.       . naproxen (NAPROSYN) 500 MG tablet Take 1 tablet (500 mg total) by mouth 2 (two) times daily with a meal.  30 tablet  0  . ONETOUCH DELICA LANCETS 33G MISC 1 each by Does not apply route daily as needed.  300 each  3   No current facility-administered medications on file prior to visit.    BP 140/80  Temp(Src) 98.3 F (36.8 C) (Oral)  Wt 169 lb (76.658 kg)       Review of Systems  Constitutional: Positive for fatigue.  HENT: Negative for congestion, dental problem, hearing loss, rhinorrhea, sinus pressure, sore throat and tinnitus.   Eyes: Negative for pain, discharge and visual disturbance.  Respiratory: Positive for cough. Negative for shortness of breath.   Cardiovascular: Negative for chest pain, palpitations and leg swelling.  Gastrointestinal: Negative for nausea, vomiting, abdominal pain, diarrhea, constipation, blood in stool and abdominal distention.  Genitourinary: Negative for dysuria, urgency, frequency, hematuria, flank pain, vaginal bleeding, vaginal discharge, difficulty urinating, vaginal pain and pelvic pain.  Musculoskeletal: Negative for arthralgias, gait problem and joint swelling.  Skin: Negative for rash.  Neurological: Negative for dizziness, syncope, speech difficulty, weakness, numbness and headaches.  Hematological: Negative for adenopathy.  Psychiatric/Behavioral: Negative for behavioral problems, dysphoric mood and agitation. The patient is not nervous/anxious.        Objective:   Physical Exam  Constitutional: She is oriented to person, place, and time. She appears well-developed and well-nourished.  HENT:  Head: Normocephalic.  Right Ear: External ear normal.  Left Ear: External ear normal.  Mouth/Throat: Oropharynx is clear and moist.  Eyes: Conjunctivae and EOM are normal. Pupils are equal, round, and  reactive to light.  Neck: Normal range of motion. Neck supple. No thyromegaly present.  Cardiovascular: Normal rate, regular rhythm, normal heart sounds and intact distal pulses.   Pulmonary/Chest: Effort normal and breath sounds normal.  Abdominal: Soft. Bowel sounds are normal. She exhibits no mass. There is no tenderness.  Musculoskeletal: Normal range of motion.  Lymphadenopathy:    She has no cervical adenopathy.  Neurological: She is alert and oriented to person, place, and time.  Skin: Skin is warm  and dry. No rash noted.  Psychiatric: She has a normal mood and affect. Her behavior is normal.          Assessment & Plan:   Resolving URI Asthma Diabetes. Poor control. We'll increase basal insulin to 80 units in the morning. We'll continue mealtime insulin with additional sliding scale coverage. Will add [place]. We'll consider endocrine referral Dyslipidemia stable

## 2013-06-06 NOTE — Progress Notes (Signed)
Pre visit review using our clinic review tool, if applicable. No additional management support is needed unless otherwise documented below in the visit note. 

## 2013-06-06 NOTE — Patient Instructions (Signed)
Increase nighttime insulin to 80 units Add  an additional 10 units for mealtime insulin if needed Titrate metformin to a total of 2 g daily if tolerated   Please check your hemoglobin A1c every 3 months    It is important that you exercise regularly, at least 20 minutes 3 to 4 times per week.  If you develop chest pain or shortness of breath seek  medical attention.  You need to lose weight.  Consider a lower calorie diet and regular exercise.  Farxiga 1 tablet daily

## 2013-06-09 ENCOUNTER — Encounter (HOSPITAL_COMMUNITY): Payer: Self-pay | Admitting: Emergency Medicine

## 2013-06-09 ENCOUNTER — Emergency Department (HOSPITAL_COMMUNITY): Payer: 59

## 2013-06-09 ENCOUNTER — Emergency Department (HOSPITAL_COMMUNITY)
Admission: EM | Admit: 2013-06-09 | Discharge: 2013-06-09 | Disposition: A | Discharge status: Home / Self Care | Payer: 59 | Attending: Emergency Medicine | Admitting: Emergency Medicine

## 2013-06-09 DIAGNOSIS — J45909 Unspecified asthma, uncomplicated: Secondary | ICD-10-CM | POA: Insufficient documentation

## 2013-06-09 DIAGNOSIS — Z8669 Personal history of other diseases of the nervous system and sense organs: Secondary | ICD-10-CM | POA: Insufficient documentation

## 2013-06-09 DIAGNOSIS — M25549 Pain in joints of unspecified hand: Secondary | ICD-10-CM | POA: Insufficient documentation

## 2013-06-09 DIAGNOSIS — I10 Essential (primary) hypertension: Secondary | ICD-10-CM | POA: Insufficient documentation

## 2013-06-09 DIAGNOSIS — Z7982 Long term (current) use of aspirin: Secondary | ICD-10-CM | POA: Insufficient documentation

## 2013-06-09 DIAGNOSIS — M79641 Pain in right hand: Secondary | ICD-10-CM

## 2013-06-09 DIAGNOSIS — Z880 Allergy status to penicillin: Secondary | ICD-10-CM | POA: Insufficient documentation

## 2013-06-09 DIAGNOSIS — E785 Hyperlipidemia, unspecified: Secondary | ICD-10-CM | POA: Insufficient documentation

## 2013-06-09 DIAGNOSIS — K219 Gastro-esophageal reflux disease without esophagitis: Secondary | ICD-10-CM | POA: Insufficient documentation

## 2013-06-09 DIAGNOSIS — Z8619 Personal history of other infectious and parasitic diseases: Secondary | ICD-10-CM | POA: Insufficient documentation

## 2013-06-09 DIAGNOSIS — Z794 Long term (current) use of insulin: Secondary | ICD-10-CM | POA: Insufficient documentation

## 2013-06-09 DIAGNOSIS — M25449 Effusion, unspecified hand: Secondary | ICD-10-CM | POA: Insufficient documentation

## 2013-06-09 DIAGNOSIS — Z79899 Other long term (current) drug therapy: Secondary | ICD-10-CM | POA: Insufficient documentation

## 2013-06-09 DIAGNOSIS — Z881 Allergy status to other antibiotic agents status: Secondary | ICD-10-CM | POA: Insufficient documentation

## 2013-06-09 DIAGNOSIS — E119 Type 2 diabetes mellitus without complications: Secondary | ICD-10-CM | POA: Insufficient documentation

## 2013-06-09 DIAGNOSIS — M129 Arthropathy, unspecified: Secondary | ICD-10-CM | POA: Insufficient documentation

## 2013-06-09 DIAGNOSIS — Z8661 Personal history of infections of the central nervous system: Secondary | ICD-10-CM | POA: Insufficient documentation

## 2013-06-09 DIAGNOSIS — Z8719 Personal history of other diseases of the digestive system: Secondary | ICD-10-CM | POA: Insufficient documentation

## 2013-06-09 MED ORDER — IBUPROFEN 400 MG PO TABS
800.0000 mg | ORAL_TABLET | Freq: Once | ORAL | Status: AC
  Administered 2013-06-09: 800 mg via ORAL
  Filled 2013-06-09: qty 2

## 2013-06-09 MED ORDER — CEPHALEXIN 500 MG PO CAPS: 500.0000 mg | ORAL_CAPSULE | Freq: Four times a day (QID) | ORAL | Status: DC

## 2013-06-09 MED ORDER — HYDROCODONE-ACETAMINOPHEN 5-325 MG PO TABS: 1.0000 | ORAL_TABLET | ORAL | Status: DC | PRN

## 2013-06-09 NOTE — ED Provider Notes (Signed)
CSN: [id number]     Arrival date & time 06/09/13  1514 History   First MD Initiated Contact with Patient 06/09/13 1748 This chart was scribed for non-physician practitioner Allean Found, PA-C working with Hurman Horn, MD by Valera Castle, ED scribe. This patient was seen in room TR06C/TR06C and the patient's care was started at 6:21 PM.     Chief Complaint  Patient presents with  . Hand Pain    The history is provided by the patient. No language interpreter was used.   HPI Comments: Renee Lam is a 52 y.o. female with h/o arthritis, who presents to the Emergency Department complaining of gradually worsening, mild, constant, right hand pain, onset 3 weeks ago, with associated swelling, onset today. She states she had trouble sleeping last night due to the pain and reports a slight decrease in ROM in her right hand due to the pain. She reports she has taken pain medication with no relief. She reports thinking it was her h/o arthritis flaring up, but states she thinks this pain is different. She denies h/o cellulitis. She reports being sick the last 2 days with URI symptoms having just finished taking Levaquin last week. She denies any other associated symptoms. She reports h/o DM.   PCP - Rogelia Boga, MD  Past Medical History  Diagnosis Date  . Asthma   . Diabetes mellitus   . Arthritis   . Chicken pox   . GERD (gastroesophageal reflux disease)   . Allergy   . Hypertension   . Hyperlipidemia   . Migraine   . Viral meningitis   . Pericarditis   . Finger infection 2008    isolation   Past Surgical History  Procedure Laterality Date  . Breast surgery  2002    breast bx  . Cesarean section      1997, 2000  . Knee arthroscopy  2010    right   Family History  Problem Relation Age of Onset  . Arthritis Other   . Cancer Other     colon,ovary/uterus,breast,prostate  . Hyperlipidemia Other   . Heart disease Other   . Hypertension Other   . Depression  Other   . Colon cancer Neg Hx   . Stomach cancer Neg Hx   . Colon polyps Mother    History  Substance Use Topics  . Smoking status: Never Smoker   . Smokeless tobacco: Never Used  . Alcohol Use: No   OB History   Grav Para Term Preterm Abortions TAB SAB Ect Mult Living                 Review of Systems  Musculoskeletal: Positive for arthralgias (right hand, with swellling) and joint swelling. Negative for myalgias.  All other systems reviewed and are negative.    Allergies  Penicillins and Zithromax  Home Medications   Current Outpatient Rx  Name  Route  Sig  Dispense  Refill  . albuterol (PROVENTIL HFA;VENTOLIN HFA) 108 (90 BASE) MCG/ACT inhaler   Inhalation   Inhale 2 puffs into the lungs every 4 (four) hours as needed. For shortness of breath/wheezing   1 Inhaler   5   . aspirin 81 MG tablet   Oral   Take 81 mg by mouth daily.          Marland Kitchen atorvastatin (LIPITOR) 40 MG tablet   Oral   Take 40 mg by mouth at bedtime.         Marland Kitchen EPINEPHrine (  EPI-PEN) 0.3 mg/0.3 mL DEVI   Intramuscular   Inject 0.3 mg into the muscle daily as needed. For anaphylaxis         . furosemide (LASIX) 20 MG tablet   Oral   Take 20 mg by mouth daily.         Marland Kitchen HYDROcodone-homatropine (HYCODAN) 5-1.5 MG/5ML syrup   Oral   Take 5 mLs by mouth every 6 (six) hours as needed for cough.   120 mL   0   . insulin aspart (NOVOLOG FLEXPEN) 100 UNIT/ML injection      30 units prior to each meal. If blood sugar is greater than 160 add an additional 10 units prior to each meal   10 mL   3   . Insulin Detemir 100 UNIT/ML SOPN   Subcutaneous   Inject 80 Units into the skin at bedtime.         Marland Kitchen levofloxacin (LEVAQUIN) 500 MG tablet   Oral   Take 1 tablet (500 mg total) by mouth daily.   7 tablet   0   . meclizine (ANTIVERT) 50 MG tablet   Oral   Take 1 tablet (50 mg total) by mouth 3 (three) times daily as needed for dizziness or nausea.   30 tablet   0   . metformin  (FORTAMET) 500 MG (OSM) 24 hr tablet   Oral   Take 1 tablet (500 mg total) by mouth daily with breakfast.   90 tablet   5   . naproxen (NAPROSYN) 500 MG tablet   Oral   Take 1 tablet (500 mg total) by mouth 2 (two) times daily with a meal.   30 tablet   0   . Blood Glucose Monitoring Suppl (FREESTYLE INSULINX SYSTEM) W/DEVICE KIT   Does not apply   by Does not apply route.         . Dapagliflozin Propanediol 10 MG TABS   Oral   Take 1 tablet by mouth daily.   90 tablet   4   . glucose blood (FREESTYLE INSULINX TEST) test strip      1 each by Other route daily. Use as instructed   360 each   3   . Insulin Pen Needle 32G X 4 MM MISC   Does not apply   1 each by Does not apply route 3 (three) times daily.   300 each   3   . Insulin Syringe-Needle U-100 (RELION INSULIN SYRINGE 1ML/31G) 31G X 5/16" 1 ML MISC      3 times a day or as needed   300 each   3   . ONETOUCH DELICA LANCETS 33G MISC   Does not apply   1 each by Does not apply route daily as needed.   300 each   3     Dx: 250.00    BP 127/78  Pulse 79  Temp(Src) 99.2 F (37.3 C)  Resp 20  Ht 4\' 10"  (1.473 m)  Wt 166 lb (75.297 kg)  BMI 34.70 kg/m2  SpO2 97%  Physical Exam  Nursing note and vitals reviewed. Constitutional: She is oriented to person, place, and time. She appears well-developed and well-nourished. No distress.  HENT:  Head: Normocephalic and atraumatic.  Eyes: EOM are normal.  Neck: Neck supple. No tracheal deviation present.  Cardiovascular: Normal rate.   Pulmonary/Chest: Effort normal. No respiratory distress.  Musculoskeletal: Normal range of motion.  Right hand moderately swollen over dorsal second MCP joint and proximal  second digit minimal redness over line joint. No signifcant pain with axial load. No specific flexor tenderness or laxity.   Neurological: She is alert and oriented to person, place, and time.  Skin: Skin is warm and dry.  Psychiatric: She has a normal  mood and affect. Her behavior is normal.    ED Course  Procedures (including critical care time)  DIAGNOSTIC STUDIES: Oxygen Saturation is 97% on room air, normal by my interpretation.    COORDINATION OF CARE: 6:24 PM-Discussed treatment plan which includes consult with Wayland Salinas, MD with pt at bedside and pt agreed to plan.   Labs Review Labs Reviewed - No data to display Imaging Review Dg Hand Complete Right  06/09/2013   CLINICAL DATA:  Right hand pain for 3 weeks.  EXAM: RIGHT HAND - COMPLETE 3+ VIEW  COMPARISON:  None.  FINDINGS: No acute bony or joint abnormality is identified. Small foci of soft tissue calcification are seen along the radial aspect of the base of the proximal phalanx of the index finger which may be due to old trauma. No notable degenerative change is identified.  IMPRESSION: No acute finding.   Electronically Signed   By: Drusilla Kanner M.D.   On: 06/09/2013 16:42    EKG Interpretation   None       MDM  No diagnosis found. 1. Hand pain, right  DDx: early cellulitis vs arthritis (inflammatory) vs early tenosynovitis. Patient started on abx and encouraged to follow up with PCP in 2 days for recheck. Return precautions given.     I personally performed the services described in this documentation, which was scribed in my presence. The recorded information has been reviewed and is accurate.     Arnoldo Hooker, PA-C 06/09/13 1856

## 2013-06-09 NOTE — ED Notes (Signed)
The pt has had rt hand pain and swelling for 3  Weeks.  The pain is in her rt index finger with sl swelling she has arthritis.  No know injury .  Sl redness

## 2013-06-09 NOTE — ED Notes (Addendum)
Pt advised to come by her PCP.  Pt with R hand pain x 3 weeks.  Pt states swelling started today.  Pt has been taking Naproxen for pain/swelling.  Little to no relief.

## 2013-06-11 NOTE — ED Provider Notes (Signed)
Medical screening examination/treatment/procedure(s) were performed by non-physician practitioner and as supervising physician I was immediately available for consultation/collaboration.  Seaira Byus M Cloris Flippo, MD 06/11/13 1630 

## 2013-06-20 ENCOUNTER — Telehealth: Payer: Self-pay | Admitting: Internal Medicine

## 2013-06-20 NOTE — Telephone Encounter (Signed)
Renee Lam requesting new script for metformin (FORTAMET) 500 MG (OSM) 24 hr tablet  # 60, last filled 12/25/10, previously prescribed by Dr. Sabra Heck.

## 2013-06-21 MED ORDER — METFORMIN HCL ER (OSM) 500 MG PO TB24: 500.0000 mg | ORAL_TABLET | Freq: Every day | ORAL | Status: DC

## 2013-06-21 NOTE — Telephone Encounter (Signed)
Rx sent to [facility] 

## 2013-06-23 ENCOUNTER — Other Ambulatory Visit: Payer: Self-pay | Admitting: Internal Medicine

## 2013-06-25 ENCOUNTER — Encounter: Payer: Self-pay | Admitting: Internal Medicine

## 2013-06-26 ENCOUNTER — Telehealth: Payer: Self-pay | Admitting: Internal Medicine

## 2013-06-26 DIAGNOSIS — E119 Type 2 diabetes mellitus without complications: Secondary | ICD-10-CM

## 2013-06-26 MED ORDER — INSULIN PEN NEEDLE 32G X 4 MM MISC: 1.0000 | Freq: Three times a day (TID) | Status: DC

## 2013-06-26 NOTE — Telephone Encounter (Signed)
[facility] requesting new script for Insulin Pen Needle 32G X 4 MM MISC

## 2013-06-26 NOTE — Telephone Encounter (Signed)
Rx sent 

## 2013-07-13 ENCOUNTER — Other Ambulatory Visit: Payer: Self-pay | Admitting: Obstetrics and Gynecology

## 2013-08-07 ENCOUNTER — Emergency Department (HOSPITAL_COMMUNITY): Payer: 59

## 2013-08-07 ENCOUNTER — Emergency Department (HOSPITAL_COMMUNITY)
Admission: EM | Admit: 2013-08-07 | Discharge: 2013-08-07 | Disposition: A | Discharge status: Home / Self Care | Payer: 59 | Attending: Emergency Medicine | Admitting: Emergency Medicine

## 2013-08-07 ENCOUNTER — Encounter (HOSPITAL_COMMUNITY): Payer: Self-pay | Admitting: Emergency Medicine

## 2013-08-07 DIAGNOSIS — M129 Arthropathy, unspecified: Secondary | ICD-10-CM | POA: Insufficient documentation

## 2013-08-07 DIAGNOSIS — Z880 Allergy status to penicillin: Secondary | ICD-10-CM | POA: Insufficient documentation

## 2013-08-07 DIAGNOSIS — Z7982 Long term (current) use of aspirin: Secondary | ICD-10-CM | POA: Insufficient documentation

## 2013-08-07 DIAGNOSIS — Z79899 Other long term (current) drug therapy: Secondary | ICD-10-CM | POA: Insufficient documentation

## 2013-08-07 DIAGNOSIS — S0993XA Unspecified injury of face, initial encounter: Secondary | ICD-10-CM | POA: Insufficient documentation

## 2013-08-07 DIAGNOSIS — Z791 Long term (current) use of non-steroidal anti-inflammatories (NSAID): Secondary | ICD-10-CM | POA: Insufficient documentation

## 2013-08-07 DIAGNOSIS — IMO0002 Reserved for concepts with insufficient information to code with codable children: Secondary | ICD-10-CM | POA: Insufficient documentation

## 2013-08-07 DIAGNOSIS — R51 Headache: Secondary | ICD-10-CM

## 2013-08-07 DIAGNOSIS — M542 Cervicalgia: Secondary | ICD-10-CM

## 2013-08-07 DIAGNOSIS — Y939 Activity, unspecified: Secondary | ICD-10-CM | POA: Insufficient documentation

## 2013-08-07 DIAGNOSIS — R519 Headache, unspecified: Secondary | ICD-10-CM

## 2013-08-07 DIAGNOSIS — I10 Essential (primary) hypertension: Secondary | ICD-10-CM | POA: Insufficient documentation

## 2013-08-07 DIAGNOSIS — W010XXA Fall on same level from slipping, tripping and stumbling without subsequent striking against object, initial encounter: Secondary | ICD-10-CM | POA: Insufficient documentation

## 2013-08-07 DIAGNOSIS — M545 Low back pain, unspecified: Secondary | ICD-10-CM

## 2013-08-07 DIAGNOSIS — K219 Gastro-esophageal reflux disease without esophagitis: Secondary | ICD-10-CM | POA: Insufficient documentation

## 2013-08-07 DIAGNOSIS — Z8619 Personal history of other infectious and parasitic diseases: Secondary | ICD-10-CM | POA: Insufficient documentation

## 2013-08-07 DIAGNOSIS — R110 Nausea: Secondary | ICD-10-CM | POA: Insufficient documentation

## 2013-08-07 DIAGNOSIS — S0990XA Unspecified injury of head, initial encounter: Secondary | ICD-10-CM | POA: Insufficient documentation

## 2013-08-07 DIAGNOSIS — Z794 Long term (current) use of insulin: Secondary | ICD-10-CM | POA: Insufficient documentation

## 2013-08-07 DIAGNOSIS — E785 Hyperlipidemia, unspecified: Secondary | ICD-10-CM | POA: Insufficient documentation

## 2013-08-07 DIAGNOSIS — W009XXA Unspecified fall due to ice and snow, initial encounter: Secondary | ICD-10-CM

## 2013-08-07 DIAGNOSIS — G43909 Migraine, unspecified, not intractable, without status migrainosus: Secondary | ICD-10-CM | POA: Insufficient documentation

## 2013-08-07 DIAGNOSIS — Y92009 Unspecified place in unspecified non-institutional (private) residence as the place of occurrence of the external cause: Secondary | ICD-10-CM | POA: Insufficient documentation

## 2013-08-07 DIAGNOSIS — S199XXA Unspecified injury of neck, initial encounter: Secondary | ICD-10-CM

## 2013-08-07 DIAGNOSIS — Z8661 Personal history of infections of the central nervous system: Secondary | ICD-10-CM | POA: Insufficient documentation

## 2013-08-07 DIAGNOSIS — J45909 Unspecified asthma, uncomplicated: Secondary | ICD-10-CM | POA: Insufficient documentation

## 2013-08-07 DIAGNOSIS — E119 Type 2 diabetes mellitus without complications: Secondary | ICD-10-CM | POA: Insufficient documentation

## 2013-08-07 DIAGNOSIS — Z872 Personal history of diseases of the skin and subcutaneous tissue: Secondary | ICD-10-CM | POA: Insufficient documentation

## 2013-08-07 MED ORDER — PROMETHAZINE HCL 25 MG PO TABS: 25.0000 mg | ORAL_TABLET | Freq: Four times a day (QID) | ORAL | Status: DC | PRN

## 2013-08-07 MED ORDER — ONDANSETRON 4 MG PO TBDP
4.0000 mg | ORAL_TABLET | Freq: Once | ORAL | Status: AC
  Administered 2013-08-07: 4 mg via ORAL
  Filled 2013-08-07: qty 1

## 2013-08-07 MED ORDER — ACETAMINOPHEN 325 MG PO TABS
650.0000 mg | ORAL_TABLET | Freq: Once | ORAL | Status: AC
  Administered 2013-08-07: 650 mg via ORAL
  Filled 2013-08-07: qty 2

## 2013-08-07 MED ORDER — CYCLOBENZAPRINE HCL 10 MG PO TABS: 10.0000 mg | ORAL_TABLET | Freq: Two times a day (BID) | ORAL | Status: DC | PRN

## 2013-08-07 MED ORDER — TRAMADOL HCL 50 MG PO TABS: 50.0000 mg | ORAL_TABLET | Freq: Four times a day (QID) | ORAL | Status: DC | PRN

## 2013-08-07 NOTE — ED Notes (Signed)
Pt fell on ice; no LOC; reports head and neck pain 10/10. Took aleve.

## 2013-08-07 NOTE — ED Notes (Signed)
Patient transported to CT 

## 2013-08-07 NOTE — ED Notes (Addendum)
Pt. fell and hit her head at her driveway this evening , no LOC / ambulatory , alert and oriented /respirations unlabored , repots pain at left side of neck , left hip and headache . C-collar applied at triage .

## 2013-08-07 NOTE — ED Provider Notes (Signed)
CSN: [id number]     Arrival date & time 08/07/13  1924 History   First MD Initiated Contact with Patient 08/07/13 1959     No chief complaint on file.    (Consider location/radiation/quality/duration/timing/severity/associated sxs/prior Treatment) HPI Pt is a 53yo female presenting to ED c/o head, neck, and back pain after slipping on ice in her driveway at home around 1800 this evening. Denies LOC but reports nausea.  Reports generalized aching throbbing headache, 10/10, left sided neck pain and lower back pain that is aching. Has not taken pain medication PTA. Denies vomiting or change in vision. Denies numbness or tingling in arms or legs. Denies loss of bowel or bladder. Pt reports taking daily aspirin but no other blood thinners.  Past Medical History  Diagnosis Date  . Asthma   . Diabetes mellitus   . Arthritis   . Chicken pox   . GERD (gastroesophageal reflux disease)   . Allergy   . Hypertension   . Hyperlipidemia   . Migraine   . Viral meningitis   . Pericarditis   . Finger infection 2008    isolation   Past Surgical History  Procedure Laterality Date  . Breast surgery  2002    breast bx  . Cesarean section      1997, 2000  . Knee arthroscopy  2010    right   Family History  Problem Relation Age of Onset  . Arthritis Other   . Cancer Other     colon,ovary/uterus,breast,prostate  . Hyperlipidemia Other   . Heart disease Other   . Hypertension Other   . Depression Other   . Colon cancer Neg Hx   . Stomach cancer Neg Hx   . Colon polyps Mother    History  Substance Use Topics  . Smoking status: Never Smoker   . Smokeless tobacco: Never Used  . Alcohol Use: No   OB History   Grav Para Term Preterm Abortions TAB SAB Ect Mult Living                 Review of Systems  Constitutional: Negative for fever and chills.  Respiratory: Negative for shortness of breath.   Cardiovascular: Negative for chest pain.  Gastrointestinal: Positive for nausea. Negative  for vomiting and abdominal pain.  Musculoskeletal: Positive for back pain, myalgias and neck pain. Negative for neck stiffness.  Neurological: Positive for headaches. Negative for dizziness, syncope, weakness and numbness.  All other systems reviewed and are negative.      Allergies  Penicillins and Zithromax  Home Medications   Current Outpatient Rx  Name  Route  Sig  Dispense  Refill  . albuterol (PROVENTIL HFA;VENTOLIN HFA) 108 (90 BASE) MCG/ACT inhaler   Inhalation   Inhale 2 puffs into the lungs every 4 (four) hours as needed. For shortness of breath/wheezing   1 Inhaler   5   . aspirin 81 MG tablet   Oral   Take 81 mg by mouth daily.          Marland Kitchen atorvastatin (LIPITOR) 40 MG tablet   Oral   Take 40 mg by mouth at bedtime.         . Blood Glucose Monitoring Suppl (FREESTYLE INSULINX SYSTEM) W/DEVICE KIT   Does not apply   by Does not apply route.         . cephALEXin (KEFLEX) 500 MG capsule   Oral   Take 1 capsule (500 mg total) by mouth 4 (four) times daily.  28 capsule   0   . cyclobenzaprine (FLEXERIL) 10 MG tablet   Oral   Take 1 tablet (10 mg total) by mouth 2 (two) times daily as needed for muscle spasms.   20 tablet   0   . Dapagliflozin Propanediol 10 MG TABS   Oral   Take 1 tablet by mouth daily.   90 tablet   4   . EPINEPHrine (EPI-PEN) 0.3 mg/0.3 mL DEVI   Intramuscular   Inject 0.3 mg into the muscle daily as needed. For anaphylaxis         . FREESTYLE TEST STRIPS test strip      Use as directed daily   400 each   1     Dx: 250.00   . furosemide (LASIX) 20 MG tablet   Oral   Take 20 mg by mouth daily.         Marland Kitchen HYDROcodone-acetaminophen (NORCO/VICODIN) 5-325 MG per tablet   Oral   Take 1 tablet by mouth every 4 (four) hours as needed.   6 tablet   0   . HYDROcodone-homatropine (HYCODAN) 5-1.5 MG/5ML syrup   Oral   Take 5 mLs by mouth every 6 (six) hours as needed for cough.   120 mL   0   . insulin aspart  (NOVOLOG FLEXPEN) 100 UNIT/ML injection      30 units prior to each meal. If blood sugar is greater than 160 add an additional 10 units prior to each meal   10 mL   3   . Insulin Detemir 100 UNIT/ML SOPN   Subcutaneous   Inject 80 Units into the skin at bedtime.         . Insulin Pen Needle 32G X 4 MM MISC   Does not apply   1 each by Does not apply route 3 (three) times daily.   300 each   3     Dx: 250.00   . Insulin Syringe-Needle U-100 (RELION INSULIN SYRINGE 1ML/31G) 31G X 5/16" 1 ML MISC      3 times a day or as needed   300 each   3   . levofloxacin (LEVAQUIN) 500 MG tablet   Oral   Take 1 tablet (500 mg total) by mouth daily.   7 tablet   0   . meclizine (ANTIVERT) 50 MG tablet   Oral   Take 1 tablet (50 mg total) by mouth 3 (three) times daily as needed for dizziness or nausea.   30 tablet   0   . metformin (FORTAMET) 500 MG (OSM) 24 hr tablet   Oral   Take 1 tablet (500 mg total) by mouth daily with breakfast.   90 tablet   3   . naproxen (NAPROSYN) 500 MG tablet   Oral   Take 1 tablet (500 mg total) by mouth 2 (two) times daily with a meal.   30 tablet   0   . ONETOUCH DELICA LANCETS 85O MISC   Does not apply   1 each by Does not apply route daily as needed.   300 each   3     Dx: 250.00   . promethazine (PHENERGAN) 25 MG tablet   Oral   Take 1 tablet (25 mg total) by mouth every 6 (six) hours as needed for nausea or vomiting.   12 tablet   0   . traMADol (ULTRAM) 50 MG tablet   Oral   Take 1 tablet (50 mg total) by  mouth every 6 (six) hours as needed.   15 tablet   0    BP 160/88  Pulse 76  Temp(Src) 98 F (36.7 C) (Oral)  Resp 18  Ht 4' 10"  (1.473 m)  Wt 168 lb (76.204 kg)  BMI 35.12 kg/m2  SpO2 98% Physical Exam  Nursing note and vitals reviewed. Constitutional: She is oriented to person, place, and time. She appears well-developed and well-nourished. No distress.  Pt sitting in chair, appears uncomfortable.  HENT:   Head: Normocephalic and atraumatic.  Eyes: Conjunctivae and EOM are normal. Pupils are equal, round, and reactive to light. Right eye exhibits no discharge. Left eye exhibits no discharge. No scleral icterus.  Neck: Normal range of motion. Neck supple.  No midline bone tenderness, no crepitus or step-offs.   Cardiovascular: Normal rate, regular rhythm and normal heart sounds.   Pulmonary/Chest: Effort normal and breath sounds normal. No respiratory distress. She has no wheezes. She has no rales. She exhibits no tenderness.  Abdominal: Soft. Bowel sounds are normal. She exhibits no distension and no mass. There is no tenderness. There is no rebound and no guarding.  Musculoskeletal: Normal range of motion. She exhibits tenderness.  Able to move all 4 extremities w/o difficulty.  Mild tenderness in left upper trapezius and left SCM.   Neurological: She is alert and oriented to person, place, and time. She has normal strength. No cranial nerve deficit or sensory deficit. She displays a negative Romberg sign. Coordination and gait normal. GCS eye subscore is 4. GCS verbal subscore is 5. GCS motor subscore is 6.  CN II-XII in tact, no focal deficit, nl finger to nose coordination. Nl sensation, 5/5 strength in all major muscle groups. Neg romberg and nl gait.  Skin: Skin is warm and dry. She is not diaphoretic.  Skin in tact. No ecchymosis or erythema.    ED Course  Procedures (including critical care time) Labs Review Labs Reviewed - No data to display Imaging Review Ct Head Wo Contrast  08/07/2013   CLINICAL DATA:  [place] and hit her head.  Headache and left neck pain.  EXAM: CT HEAD WITHOUT CONTRAST  CT CERVICAL SPINE WITHOUT CONTRAST  TECHNIQUE: Multidetector CT imaging of the head and cervical spine was performed following the standard protocol without intravenous contrast. Multiplanar CT image reconstructions of the cervical spine were also generated.  COMPARISON:  Paranasal sinuses dated  11/25/2008.  FINDINGS: CT HEAD FINDINGS  Normal appearing cerebral hemispheres and posterior fossa structures. Normal size and position of the ventricles. No skull fracture, intracranial hemorrhage or paranasal sinus air-fluid levels.  CT CERVICAL SPINE FINDINGS  Multilevel degenerative changes. No prevertebral soft tissue swelling, fractures or subluxations.  IMPRESSION: 1. No acute abnormality. 2. Cervical spine degenerative changes.   Electronically Signed   By: Enrique Sack M.D.   On: 08/07/2013 20:59   Ct Cervical Spine Wo Contrast  08/07/2013   CLINICAL DATA:  [place] and hit her head.  Headache and left neck pain.  EXAM: CT HEAD WITHOUT CONTRAST  CT CERVICAL SPINE WITHOUT CONTRAST  TECHNIQUE: Multidetector CT imaging of the head and cervical spine was performed following the standard protocol without intravenous contrast. Multiplanar CT image reconstructions of the cervical spine were also generated.  COMPARISON:  Paranasal sinuses dated 11/25/2008.  FINDINGS: CT HEAD FINDINGS  Normal appearing cerebral hemispheres and posterior fossa structures. Normal size and position of the ventricles. No skull fracture, intracranial hemorrhage or paranasal sinus air-fluid levels.  CT CERVICAL SPINE FINDINGS  Multilevel degenerative changes. No prevertebral soft tissue swelling, fractures or subluxations.  IMPRESSION: 1. No acute abnormality. 2. Cervical spine degenerative changes.   Electronically Signed   By: Enrique Sack M.D.   On: 08/07/2013 20:59    EKG Interpretation   None       MDM   Final diagnoses:  Fall due to slipping on ice or snow  Headache  Neck pain on left side  Low back pain    Pt presenting to ED c/o head, neck and back pain after slip and fall onto ice. Denies LOC. Reports nausea but no vomiting. No vomiting in ED.  No evidence of obvious head trauma.   CT head and cervical spine: no acute abnormality.   Normal neuro exam. No spinal tenderness. No further imaging needed at this  time. Tx in ED: acetaminophen and zofran.  Pt will be discharged home with symptomatic Tx: phenergan and tramadol. Advised to f/u with PCP as needed for continued pain. Return precautions provided. Pt verbalized understanding and agreement with tx plan.     Noland Fordyce, PA-C 08/08/13 [phone]

## 2013-08-07 NOTE — ED Notes (Signed)
c-collar removed per PA.

## 2013-08-07 NOTE — ED Notes (Signed)
Pt reports lots of nausea. Takes aspirin.

## 2013-08-07 NOTE — ED Notes (Signed)
Pt returned from CT °

## 2013-08-07 NOTE — ED Notes (Signed)
Pt requesting tylenol for headache; refuses nausea meds.

## 2013-08-08 NOTE — ED Provider Notes (Signed)
Medical screening examination/treatment/procedure(s) were performed by non-physician practitioner and as supervising physician I was immediately available for consultation/collaboration.  EKG Interpretation   None        Merryl Hacker, MD 08/08/13 [phone]

## 2013-09-06 ENCOUNTER — Telehealth: Payer: Self-pay | Admitting: Internal Medicine

## 2013-09-06 NOTE — Telephone Encounter (Signed)
Pt would like to have a1c and lipid panel prior to appt 10-12-13. Pt will like to pick up rx to take to [facility]

## 2013-09-07 ENCOUNTER — Ambulatory Visit: Payer: 59 | Admitting: Internal Medicine

## 2013-09-07 NOTE — Telephone Encounter (Signed)
Left detailed message lab slip ready for pickup, will be at the front desk.

## 2013-10-12 ENCOUNTER — Ambulatory Visit: Payer: 59 | Admitting: Internal Medicine

## 2014-01-15 LAB — LIPID PANEL
Cholesterol: 310 mg/dL — AB (ref 0–200)
HDL: 47 mg/dL (ref 35–70)
Triglycerides: 426 mg/dL — AB (ref 40–160)

## 2014-01-15 LAB — BASIC METABOLIC PANEL: Glucose: 135 mg/dL

## 2014-01-15 LAB — HEMOGLOBIN A1C: Hgb A1c MFr Bld: 13.1 % — AB (ref 4.0–6.0)

## 2014-01-26 ENCOUNTER — Encounter (HOSPITAL_COMMUNITY): Payer: Self-pay | Admitting: Emergency Medicine

## 2014-01-26 DIAGNOSIS — R350 Frequency of micturition: Secondary | ICD-10-CM | POA: Insufficient documentation

## 2014-01-26 DIAGNOSIS — Z79899 Other long term (current) drug therapy: Secondary | ICD-10-CM | POA: Insufficient documentation

## 2014-01-26 DIAGNOSIS — Z872 Personal history of diseases of the skin and subcutaneous tissue: Secondary | ICD-10-CM | POA: Insufficient documentation

## 2014-01-26 DIAGNOSIS — IMO0001 Reserved for inherently not codable concepts without codable children: Secondary | ICD-10-CM | POA: Insufficient documentation

## 2014-01-26 DIAGNOSIS — E785 Hyperlipidemia, unspecified: Secondary | ICD-10-CM | POA: Diagnosis not present

## 2014-01-26 DIAGNOSIS — M129 Arthropathy, unspecified: Secondary | ICD-10-CM | POA: Insufficient documentation

## 2014-01-26 DIAGNOSIS — Z8619 Personal history of other infectious and parasitic diseases: Secondary | ICD-10-CM | POA: Diagnosis not present

## 2014-01-26 DIAGNOSIS — Z792 Long term (current) use of antibiotics: Secondary | ICD-10-CM | POA: Diagnosis not present

## 2014-01-26 DIAGNOSIS — Z794 Long term (current) use of insulin: Secondary | ICD-10-CM | POA: Diagnosis not present

## 2014-01-26 DIAGNOSIS — J45909 Unspecified asthma, uncomplicated: Secondary | ICD-10-CM | POA: Insufficient documentation

## 2014-01-26 DIAGNOSIS — N760 Acute vaginitis: Secondary | ICD-10-CM | POA: Insufficient documentation

## 2014-01-26 DIAGNOSIS — G43909 Migraine, unspecified, not intractable, without status migrainosus: Secondary | ICD-10-CM | POA: Insufficient documentation

## 2014-01-26 DIAGNOSIS — Z880 Allergy status to penicillin: Secondary | ICD-10-CM | POA: Diagnosis not present

## 2014-01-26 DIAGNOSIS — Z7982 Long term (current) use of aspirin: Secondary | ICD-10-CM | POA: Insufficient documentation

## 2014-01-26 DIAGNOSIS — I10 Essential (primary) hypertension: Secondary | ICD-10-CM | POA: Diagnosis not present

## 2014-01-26 DIAGNOSIS — E1165 Type 2 diabetes mellitus with hyperglycemia: Principal | ICD-10-CM

## 2014-01-26 DIAGNOSIS — Z8719 Personal history of other diseases of the digestive system: Secondary | ICD-10-CM | POA: Diagnosis not present

## 2014-01-26 LAB — URINALYSIS, ROUTINE W REFLEX MICROSCOPIC
Bilirubin Urine: NEGATIVE
Glucose, UA: >1000 mg/dL — AB
Hgb urine dipstick: NEGATIVE
Ketones, ur: NEGATIVE mg/dL
Leukocytes, UA: NEGATIVE
Nitrite: NEGATIVE
Protein, ur: NEGATIVE mg/dL
Specific Gravity, Urine: 1.015 (ref 1.005–1.030)
Urobilinogen, UA: 0.2 mg/dL (ref 0.0–1.0)
pH: 6 (ref 5.0–8.0)

## 2014-01-26 LAB — URINE MICROSCOPIC-ADD ON

## 2014-01-26 NOTE — ED Notes (Signed)
Pt reports dysuria, urinary frequency x 3 days with left sided flank pain. Pt also reports vaginal itching, dryness x 2 days. Denies fever/chills. NAD.

## 2014-01-27 ENCOUNTER — Emergency Department (HOSPITAL_COMMUNITY)
Admission: EM | Admit: 2014-01-27 | Discharge: 2014-01-27 | Disposition: A | Discharge status: Home / Self Care | Payer: 59 | Attending: Emergency Medicine | Admitting: Emergency Medicine

## 2014-01-27 DIAGNOSIS — R739 Hyperglycemia, unspecified: Secondary | ICD-10-CM

## 2014-01-27 DIAGNOSIS — N760 Acute vaginitis: Secondary | ICD-10-CM

## 2014-01-27 LAB — CBG MONITORING, ED: Glucose-Capillary: 286 mg/dL — ABNORMAL HIGH (ref 70–99)

## 2014-01-27 MED ORDER — FLUCONAZOLE 100 MG PO TABS
150.0000 mg | ORAL_TABLET | Freq: Once | ORAL | Status: AC
  Administered 2014-01-27: 150 mg via ORAL
  Filled 2014-01-27: qty 2

## 2014-01-27 NOTE — ED Notes (Signed)
MD at bedside. 

## 2014-01-27 NOTE — ED Provider Notes (Signed)
CSN: [id number]     Arrival date & time 01/26/14  2139 History   First MD Initiated Contact with Patient 01/27/14 0214     Chief Complaint  Patient presents with  . Urinary Frequency  . Vaginal Itching     (Consider location/radiation/quality/duration/timing/severity/associated sxs/prior Treatment) HPI This patient is a 53 yo woman with poorly controlled DM who has been off medications for several weeks because of financial constraints. She is post menopausal and comes in with complaints of vaginal itching. She wonders if she may have a UTI. She has not been sexually active in > 15 years and has no history of STD. No abdominal pain or fever. She has not been monitoring her accuchecks. Nothing makes her sx worse or better. She has f/u with her PCP this coming week.   Past Medical History  Diagnosis Date  . Asthma   . Diabetes mellitus   . Arthritis   . Chicken pox   . GERD (gastroesophageal reflux disease)   . Allergy   . Hypertension   . Hyperlipidemia   . Migraine   . Viral meningitis   . Pericarditis   . Finger infection 2008    isolation   Past Surgical History  Procedure Laterality Date  . Breast surgery  2002    breast bx  . Cesarean section      1997, 2000  . Knee arthroscopy  2010    right   Family History  Problem Relation Age of Onset  . Arthritis Other   . Cancer Other     colon,ovary/uterus,breast,prostate  . Hyperlipidemia Other   . Heart disease Other   . Hypertension Other   . Depression Other   . Colon cancer Neg Hx   . Stomach cancer Neg Hx   . Colon polyps Mother    History  Substance Use Topics  . Smoking status: Never Smoker   . Smokeless tobacco: Never Used  . Alcohol Use: No   OB History   Grav Para Term Preterm Abortions TAB SAB Ect Mult Living                 Review of Systems  10 point review of symptoms obtained and is negative with the exceptions of symptoms noted above.   Allergies  Penicillins and Zithromax  Home  Medications   Prior to Admission medications   Medication Sig Start Date End Date Taking? Authorizing Provider  albuterol (PROVENTIL HFA;VENTOLIN HFA) 108 (90 BASE) MCG/ACT inhaler Inhale 2 puffs into the lungs every 4 (four) hours as needed. For shortness of breath/wheezing 05/31/13 09/07/14  Brunetta Jeans, PA-C  aspirin 81 MG tablet Take 81 mg by mouth daily.     Historical Provider, MD  atorvastatin (LIPITOR) 40 MG tablet Take 40 mg by mouth at bedtime. 01/05/13 03/24/14  Marletta Lor, MD  Blood Glucose Monitoring Suppl ([place]) W/DEVICE KIT by Does not apply route.    Historical Provider, MD  cephALEXin (KEFLEX) 500 MG capsule Take 1 capsule (500 mg total) by mouth 4 (four) times daily. 06/09/13   Shari A Upstill, PA-C  cyclobenzaprine (FLEXERIL) 10 MG tablet Take 1 tablet (10 mg total) by mouth 2 (two) times daily as needed for muscle spasms. 08/07/13   Noland Fordyce, PA-C  Dapagliflozin Propanediol 10 MG TABS Take 1 tablet by mouth daily. 06/06/13   Marletta Lor, MD  EPINEPHrine (EPI-PEN) 0.3 mg/0.3 mL DEVI Inject 0.3 mg into the muscle daily as needed. For anaphylaxis  Historical Provider, MD  FREESTYLE TEST STRIPS test strip Use as directed daily 06/23/13   Marletta Lor, MD  furosemide (LASIX) 20 MG tablet Take 20 mg by mouth daily. 01/05/13 01/05/14  Marletta Lor, MD  HYDROcodone-acetaminophen (NORCO/VICODIN) 5-325 MG per tablet Take 1 tablet by mouth every 4 (four) hours as needed. 06/09/13   Shari A Upstill, PA-C  HYDROcodone-homatropine (HYCODAN) 5-1.5 MG/5ML syrup Take 5 mLs by mouth every 6 (six) hours as needed for cough. 05/31/13   Brunetta Jeans, PA-C  insulin aspart (NOVOLOG FLEXPEN) 100 UNIT/ML injection 30 units prior to each meal. If blood sugar is greater than 160 add an additional 10 units prior to each meal 06/06/13   Marletta Lor, MD  Insulin Detemir 100 UNIT/ML SOPN Inject 80 Units into the skin at bedtime. 06/06/13    Marletta Lor, MD  Insulin Pen Needle 32G X 4 MM MISC 1 each by Does not apply route 3 (three) times daily. 06/26/13   Marletta Lor, MD  Insulin Syringe-Needle U-100 (RELION INSULIN SYRINGE 1ML/31G) 31G X 5/16" 1 ML MISC 3 times a day or as needed 06/25/11   Marletta Lor, MD  levofloxacin (LEVAQUIN) 500 MG tablet Take 1 tablet (500 mg total) by mouth daily. 05/31/13   Brunetta Jeans, PA-C  meclizine (ANTIVERT) 50 MG tablet Take 1 tablet (50 mg total) by mouth 3 (three) times daily as needed for dizziness or nausea. 10/01/12   Adlih Moreno-Coll, MD  metformin (FORTAMET) 500 MG (OSM) 24 hr tablet Take 1 tablet (500 mg total) by mouth daily with breakfast. 06/21/13   Marletta Lor, MD  naproxen (NAPROSYN) 500 MG tablet Take 1 tablet (500 mg total) by mouth 2 (two) times daily with a meal. 03/03/13   Charles B. Karle Starch, MD  [facility] DELICA LANCETS 56P MISC 1 each by Does not apply route daily as needed. 12/26/12   Marletta Lor, MD  promethazine (PHENERGAN) 25 MG tablet Take 1 tablet (25 mg total) by mouth every 6 (six) hours as needed for nausea or vomiting. 08/07/13   Noland Fordyce, PA-C  traMADol (ULTRAM) 50 MG tablet Take 1 tablet (50 mg total) by mouth every 6 (six) hours as needed. 08/07/13   Noland Fordyce, PA-C   BP 120/69  Pulse 82  Temp(Src) 98.1 F (36.7 C) (Oral)  Resp 18  SpO2 96% Physical Exam  Gen: well nourished and well developed appearing Head: NCAT Ears: normal to inspection Nose: normal to inspection, no epistaxis or drainage Mouth: oral mucsoa is well hydrated appearing, normal posterior oropharynx Neck: supple, no stridor CV: RRR, no murmur, palpable peripheral pulses Resp: lung sounds are clear to auscultation bilaterally, no wheeing or rhonchi or rales, normal respiratory effort.  Abd: soft, nontender, nondistended GU: patient declines.  Extremities: normal to inspection.  Skin: warm and dry Neuro: CN ii - XII, no focal deficitis Psyche;  normal affect, cooperative.    ED Course  Procedures (including critical care time) Labs Review  Results for orders placed during the hospital encounter of 01/27/14 (from the past 24 hour(s))  URINALYSIS, ROUTINE W REFLEX MICROSCOPIC     Status: Abnormal   Collection Time    01/26/14 10:16 PM      Result Value Ref Range   Color, Urine YELLOW  YELLOW   APPearance CLEAR  CLEAR   Specific Gravity, Urine 1.015  1.005 - 1.030   pH 6.0  5.0 - 8.0   Glucose, UA >1000 (*)  NEGATIVE mg/dL   Hgb urine dipstick NEGATIVE  NEGATIVE   Bilirubin Urine NEGATIVE  NEGATIVE   Ketones, ur NEGATIVE  NEGATIVE mg/dL   Protein, ur NEGATIVE  NEGATIVE mg/dL   Urobilinogen, UA 0.2  0.0 - 1.0 mg/dL   Nitrite NEGATIVE  NEGATIVE   Leukocytes, UA NEGATIVE  NEGATIVE  URINE MICROSCOPIC-ADD ON     Status: None   Collection Time    01/26/14 10:16 PM      Result Value Ref Range   Squamous Epithelial / LPF RARE  RARE   WBC, UA 0-2  <3 WBC/hpf   RBC / HPF 0-2  <3 RBC/hpf   Bacteria, UA RARE  RARE   Urine-Other RARE YEAST    CBG MONITORING, ED     Status: Abnormal   Collection Time    01/27/14  2:07 AM      Result Value Ref Range   Glucose-Capillary 286 (*) 70 - 99 mg/dL     MDM   Patient with uncontrolled DM. I suspect that she has a yeast infection. However, she says she is in a hurry and does not have time to wait for a pelvic exam. WE will tx empirically with single dose of Fluconazole. She will f/u re: her complaint with her PCP this week. We will refill her insulin.     Elyn Peers, MD 01/27/14 [phone]

## 2014-01-28 ENCOUNTER — Ambulatory Visit: Payer: 59 | Admitting: Internal Medicine

## 2014-01-31 ENCOUNTER — Ambulatory Visit (INDEPENDENT_AMBULATORY_CARE_PROVIDER_SITE_OTHER): Payer: 59 | Admitting: Internal Medicine

## 2014-01-31 ENCOUNTER — Encounter: Payer: Self-pay | Admitting: Internal Medicine

## 2014-01-31 VITALS — BP 130/86 | HR 93 | Temp 98.8°F | Resp 20 | Ht <= 58 in | Wt 160.0 lb

## 2014-01-31 DIAGNOSIS — E109 Type 1 diabetes mellitus without complications: Secondary | ICD-10-CM

## 2014-01-31 DIAGNOSIS — E7800 Pure hypercholesterolemia, unspecified: Secondary | ICD-10-CM

## 2014-01-31 DIAGNOSIS — J45909 Unspecified asthma, uncomplicated: Secondary | ICD-10-CM

## 2014-01-31 DIAGNOSIS — E119 Type 2 diabetes mellitus without complications: Secondary | ICD-10-CM

## 2014-01-31 DIAGNOSIS — J209 Acute bronchitis, unspecified: Secondary | ICD-10-CM

## 2014-01-31 DIAGNOSIS — E1165 Type 2 diabetes mellitus with hyperglycemia: Secondary | ICD-10-CM

## 2014-01-31 MED ORDER — CANAGLIFLOZIN 300 MG PO TABS: 1.0000 | ORAL_TABLET | Freq: Every day | ORAL | Status: DC

## 2014-01-31 MED ORDER — ATORVASTATIN CALCIUM 80 MG PO TABS: 40.0000 mg | ORAL_TABLET | Freq: Every day | ORAL | Status: DC

## 2014-01-31 NOTE — Progress Notes (Signed)
Pre visit review using our clinic review tool, if applicable. No additional management support is needed unless otherwise documented below in the visit note. 

## 2014-01-31 NOTE — Patient Instructions (Signed)
Limit your sodium (Salt) intake    It is important that you exercise regularly, at least 20 minutes 3 to 4 times per week.  If you develop chest pain or shortness of breath seek  medical attention.   Please check your hemoglobin A1c every 3 months Cardiac Diet A cardiac diet can help stop heart disease or a stroke from happening. It involves eating less unhealthy fats and eating more healthy fats.  FOODS TO AVOID OR LIMIT  Limit saturated fats. This type of fat is found in oils and dairy products, such as:  Coconut oil.  Palm oil.  Cocoa butter.  Butter.  Avoid trans-fat or hydrogenated oils. These are found in fried or pre-made baked goods, such as:  Margarine.  Pre-made cookies, cakes, and crackers.  Limit processed meats (hot dogs, deli meats, sausage) to 3 ounces a week.  Limit high-fat meats (marbled meats, fried chicken, or chicken with skin) to 3 ounces a week.  Limit salt (sodium) to 1500 milligrams a day.   Limit sweets and drinks with added sugar to no more than 5 servings a week. One serving is:  1 tablespoon of sugar.  1 tablespoon of jelly or jam.   cup sorbet.  1 cup lemonade.   cup regular soda. EAT MORE OF THE FOLLOWING FOODS Fruit  Eat 4to 5 servings a day. One serving of fruit is:  1 medium whole fruit.   cup dried fruit.   cup of fresh, frozen, or canned fruit.   cup 100% fruit juice. Vegetables  Eat 4 to 5 servings a day. One serving is:  1 cup raw leafy vegetables.   cup raw or cooked, cut-up vegetables.   cup vegetable juice. Whole Grains  Eat 3 servings a day (1 ounce equals 1 serving). Legumes (such as beans, peas, and lentils)   Eat at least 4 servings a week ( cup equals 1 serving). Nuts and Seeds   Eat at least 4 servings a week ( cup equals 1 serving). Dietary Fiber  Eat 20 to 30 grams a day. Some foods high in dietary fiber include:  Dried beans.  Citrus fruits.  Apples, bananas.  Broccoli,  Brussels sprouts, and eggplant.  Oats. Omega-3 Fats  Eat food with omega-3 fats. You can also take a dietary pill (supplement) that has 1 gram of DHA and EPA. Have 3.5 ounces of fatty fish a week, such as:  Salmon.  Mackerel.  [place] tuna.  Sardines.  Lake trout.  Herring. PREPARING YOUR FOOD  Broil, bake, steam, or roast foods. Do not fry food. Do not cook food in butter (fat).  Use non-stick cooking sprays.  Remove skin from poultry, such as chicken and [place].  Remove fat from meat.  Take the fat off the top of stews, soups, and gravy.  Use lemon or herbs to flavor food instead of using butter or margarine.  Use nonfat yogurt, salsa, or low-fat dressings for salads. Document Released: 11/30/2011 Document Reviewed: 11/30/2011 [facility] Patient Information 2015 [place]. This information is not intended to replace advice given to you by your health care provider. Make sure you discuss any questions you have with your health care provider.

## 2014-01-31 NOTE — Progress Notes (Signed)
Subjective:    Patient ID: Renee Lam, female    DOB: 07/26/1960, 53 y.o.   MRN: [id number]  HPI  53 year old patient who is seen today for followup of diabetes.  It's a financial hardship and travel related to the poor health of her father.  She has been off all medications for 3 months.  She was seen in the ED about one week ago and was given samples of insulin more recently.  Blood sugars have nicely controlled.  The patient had a health screening lab performed on August 4 with a hemoglobin A1c of 13 point 1, total cholesterol 310 random blood sugars surprisingly, was 135, BMI 30 point 7  Eye examination December 2014  Past Medical History  Diagnosis Date  . Asthma   . Diabetes mellitus   . Arthritis   . Chicken pox   . GERD (gastroesophageal reflux disease)   . Allergy   . Hypertension   . Hyperlipidemia   . Migraine   . Viral meningitis   . Pericarditis   . Finger infection 2008    isolation    History   Social History  . Marital Status: Divorced    Spouse Name: N/A    Number of Children: N/A  . Years of Education: N/A   Occupational History  . Not on file.   Social History Main Topics  . Smoking status: Never Smoker   . Smokeless tobacco: Never Used  . Alcohol Use: No  . Drug Use: No  . Sexual Activity: Not on file   Other Topics Concern  . Not on file   Social History Narrative  . No narrative on file    Past Surgical History  Procedure Laterality Date  . Breast surgery  2002    breast bx  . Cesarean section      1997, 2000  . Knee arthroscopy  2010    right    Family History  Problem Relation Age of Onset  . Arthritis Other   . Cancer Other     colon,ovary/uterus,breast,prostate  . Hyperlipidemia Other   . Heart disease Other   . Hypertension Other   . Depression Other   . Colon cancer Neg Hx   . Stomach cancer Neg Hx   . Colon polyps Mother     Allergies  Allergen Reactions  . Penicillins Hives  . Zithromax  [Azithromycin] Hives    z-max; states can take Z pack    Current Outpatient Prescriptions on File Prior to Visit  Medication Sig Dispense Refill  . albuterol (PROVENTIL HFA;VENTOLIN HFA) 108 (90 BASE) MCG/ACT inhaler Inhale 2 puffs into the lungs every 4 (four) hours as needed. For shortness of breath/wheezing  1 Inhaler  5  . aspirin 81 MG tablet Take 81 mg by mouth daily.       Marland Kitchen atorvastatin (LIPITOR) 40 MG tablet Take 40 mg by mouth at bedtime.      . Blood Glucose Monitoring Suppl (FREESTYLE INSULINX SYSTEM) W/DEVICE KIT by Does not apply route.      . cyclobenzaprine (FLEXERIL) 10 MG tablet Take 1 tablet (10 mg total) by mouth 2 (two) times daily as needed for muscle spasms.  20 tablet  0  . Dapagliflozin Propanediol 10 MG TABS Take 1 tablet by mouth daily.  90 tablet  4  . EPINEPHrine (EPI-PEN) 0.3 mg/0.3 mL DEVI Inject 0.3 mg into the muscle daily as needed. For anaphylaxis      . FREESTYLE  TEST STRIPS test strip Use as directed daily  400 each  1  . furosemide (LASIX) 20 MG tablet Take 20 mg by mouth daily.      . insulin aspart (NOVOLOG FLEXPEN) 100 UNIT/ML injection 30 units prior to each meal. If blood sugar is greater than 160 add an additional 10 units prior to each meal  10 mL  3  . Insulin Detemir 100 UNIT/ML SOPN Inject 80 Units into the skin at bedtime.      . Insulin Pen Needle 32G X 4 MM MISC 1 each by Does not apply route 3 (three) times daily.  300 each  3  . Insulin Syringe-Needle U-100 (RELION INSULIN SYRINGE 1ML/31G) 31G X 5/16" 1 ML MISC 3 times a day or as needed  300 each  3  . metformin (FORTAMET) 500 MG (OSM) 24 hr tablet Take 1 tablet (500 mg total) by mouth daily with breakfast.  90 tablet  3  . naproxen (NAPROSYN) 500 MG tablet Take 1 tablet (500 mg total) by mouth 2 (two) times daily with a meal.  30 tablet  0  . ONETOUCH DELICA LANCETS 34L MISC 1 each by Does not apply route daily as needed.  300 each  3   No current facility-administered medications on file  prior to visit.    BP 130/86  Pulse 93  Temp(Src) 98.8 F (37.1 C) (Oral)  Resp 20  Ht 4' 10"  (1.473 m)  Wt 160 lb (72.576 kg)  BMI 33.45 kg/m2  SpO2 98%       Review of Systems  Constitutional: Negative.   HENT: Negative for congestion, dental problem, hearing loss, rhinorrhea, sinus pressure, sore throat and tinnitus.   Eyes: Negative for pain, discharge and visual disturbance.  Respiratory: Negative for cough and shortness of breath.   Cardiovascular: Negative for chest pain, palpitations and leg swelling.  Gastrointestinal: Negative for nausea, vomiting, abdominal pain, diarrhea, constipation, blood in stool and abdominal distention.  Genitourinary: Negative for dysuria, urgency, frequency, hematuria, flank pain, vaginal bleeding, vaginal discharge, difficulty urinating, vaginal pain and pelvic pain.  Musculoskeletal: Negative for arthralgias, gait problem and joint swelling.  Skin: Negative for rash.  Neurological: Negative for dizziness, syncope, speech difficulty, weakness, numbness and headaches.  Hematological: Negative for adenopathy.  Psychiatric/Behavioral: Negative for behavioral problems, dysphoric mood and agitation. The patient is nervous/anxious.        Objective:   Physical Exam  Constitutional: She is oriented to person, place, and time. She appears well-developed and well-nourished.  Blood pressure 134/70  HENT:  Head: Normocephalic.  Right Ear: External ear normal.  Left Ear: External ear normal.  Mouth/Throat: Oropharynx is clear and moist.  Eyes: Conjunctivae and EOM are normal. Pupils are equal, round, and reactive to light.  Neck: Normal range of motion. Neck supple. No thyromegaly present.  Cardiovascular: Normal rate, regular rhythm, normal heart sounds and intact distal pulses.   Pulmonary/Chest: Effort normal and breath sounds normal.  Abdominal: Soft. Bowel sounds are normal. She exhibits no mass. There is no tenderness.  Musculoskeletal:  Normal range of motion.  Lymphadenopathy:    She has no cervical adenopathy.  Neurological: She is alert and oriented to person, place, and time.  Skin: Skin is warm and dry. No rash noted.  Psychiatric: She has a normal mood and affect. Her behavior is normal.          Assessment & Plan:   Diabetes mellitus.  We'll resume the basal bolus insulin, metformin therapy.  She states that she had a difficult time with seconds card with [facility]- will switch to [facility] Dyslipidemia.  We'll resume the atorvastatin 80 History of asthma, stable

## 2014-02-01 MED ORDER — GLUCOSE BLOOD VI STRP: ORAL_STRIP | Status: DC

## 2014-02-01 MED ORDER — INSULIN ASPART 100 UNIT/ML ~~LOC~~ SOLN: SUBCUTANEOUS | Status: DC

## 2014-02-01 MED ORDER — INSULIN DETEMIR 100 UNIT/ML FLEXPEN: 80.0000 [IU] | PEN_INJECTOR | Freq: Every day | SUBCUTANEOUS | Status: DC

## 2014-02-01 MED ORDER — "INSULIN SYRINGE-NEEDLE U-100 31G X 5/16"" 1 ML MISC": Status: DC

## 2014-02-01 MED ORDER — ATORVASTATIN CALCIUM 80 MG PO TABS: 40.0000 mg | ORAL_TABLET | Freq: Every day | ORAL | Status: DC

## 2014-02-01 MED ORDER — INSULIN PEN NEEDLE 32G X 4 MM MISC: Status: DC

## 2014-02-01 MED ORDER — ALBUTEROL SULFATE HFA 108 (90 BASE) MCG/ACT IN AERS: 2.0000 | INHALATION_SPRAY | RESPIRATORY_TRACT | Status: DC | PRN

## 2014-02-01 MED ORDER — CANAGLIFLOZIN 300 MG PO TABS: 1.0000 | ORAL_TABLET | Freq: Every day | ORAL | Status: DC

## 2014-02-01 NOTE — Addendum Note (Signed)
Addended by: Vergia Alberts C on: 02/01/2014 09:00 AM   Modules accepted: Orders, Medications

## 2014-02-07 ENCOUNTER — Other Ambulatory Visit: Payer: Self-pay | Admitting: Internal Medicine

## 2014-02-08 ENCOUNTER — Telehealth: Payer: Self-pay | Admitting: Internal Medicine

## 2014-02-08 MED ORDER — INSULIN ASPART 100 UNIT/ML FLEXPEN: PEN_INJECTOR | SUBCUTANEOUS | Status: DC

## 2014-02-08 NOTE — Telephone Encounter (Signed)
Left message on voicemail to call office.  

## 2014-02-08 NOTE — Telephone Encounter (Signed)
Spoke to pt, told her I received her voicemail and I am glad things are worked out. Asked pt if she prefers pens over vials? Pt said yes. Told pt will call and change Novolog to pens. Pt verbalized understanding. Told pt I do have a sample of Levemir she can pickup. Pt verbalized understanding.

## 2014-02-08 NOTE — Telephone Encounter (Signed)
See other message

## 2014-02-08 NOTE — Telephone Encounter (Signed)
Called [facility] and spoke to [place], she said they did receive the Rx's but the pt's account is on hold. Pt needs to contact costumer service at [phone]. Told her okay, I will let pt know.

## 2014-02-08 NOTE — Telephone Encounter (Signed)
Pt would like to know if she can get a sample of Insulin Detemir (LEVEMIR) 100 UNIT/ML Pen

## 2014-02-08 NOTE — Telephone Encounter (Signed)
Pt request refill of the following:  Insulin Detemir (LEVEMIR) 100 UNIT/ML Pen, insulin aspart (NOVOLOG) 100 UNIT/ML injection,glucose blood (FREESTYLE TEST STRIPS) test strip,  Insulin Pen Needle 32G X 4 MM MISC   Showing that these were sent to the pharmacy on 02/01/14 they keep telling pt that they don't have it     Phamacy: [facility]

## 2014-02-11 ENCOUNTER — Encounter: Payer: Self-pay | Admitting: Internal Medicine

## 2014-02-26 ENCOUNTER — Encounter: Payer: Self-pay | Admitting: Internal Medicine

## 2014-02-26 ENCOUNTER — Ambulatory Visit (INDEPENDENT_AMBULATORY_CARE_PROVIDER_SITE_OTHER): Payer: 59 | Admitting: Internal Medicine

## 2014-02-26 VITALS — BP 110/72 | HR 90 | Temp 98.1°F | Resp 20 | Ht <= 58 in | Wt 158.0 lb

## 2014-02-26 DIAGNOSIS — E119 Type 2 diabetes mellitus without complications: Secondary | ICD-10-CM

## 2014-02-26 DIAGNOSIS — E7800 Pure hypercholesterolemia, unspecified: Secondary | ICD-10-CM

## 2014-02-26 DIAGNOSIS — E1365 Other specified diabetes mellitus with hyperglycemia: Secondary | ICD-10-CM

## 2014-02-26 NOTE — Progress Notes (Signed)
Pre visit review using our clinic review tool, if applicable. No additional management support is needed unless otherwise documented below in the visit note. 

## 2014-02-26 NOTE — Patient Instructions (Signed)
Acute bronchitis symptoms for less than 10 days are generally not helped by antibiotics.  Take over-the-counter expectorants and cough medications such as  Mucinex DM.  Call if there is no improvement in 5 to 7 days or if  you develop worsening cough, fever, or new symptoms, such as shortness of breath or chest pain.   

## 2014-02-26 NOTE — Progress Notes (Signed)
Subjective:    Patient ID: Renee Lam, female    DOB: 14-Mar-1961, 53 y.o.   MRN: [id number]  HPI 53 year old patient who is seen today recovering from a flu syndrome.  For the past 4 days.  She has had fever, cough, sore throat, generalized myalgias, and fatigue.  Today, she feels much improved and has only mild residual cough.  She requires a note to return to work tomorrow  Past Medical History  Diagnosis Date  . Asthma   . Diabetes mellitus   . Arthritis   . Chicken pox   . GERD (gastroesophageal reflux disease)   . Allergy   . Hypertension   . Hyperlipidemia   . Migraine   . Viral meningitis   . Pericarditis   . Finger infection 2008    isolation    History   Social History  . Marital Status: Divorced    Spouse Name: N/A    Number of Children: N/A  . Years of Education: N/A   Occupational History  . Not on file.   Social History Main Topics  . Smoking status: Never Smoker   . Smokeless tobacco: Never Used  . Alcohol Use: No  . Drug Use: No  . Sexual Activity: Not on file   Other Topics Concern  . Not on file   Social History Narrative  . No narrative on file    Past Surgical History  Procedure Laterality Date  . Breast surgery  2002    breast bx  . Cesarean section      1997, 2000  . Knee arthroscopy  2010    right    Family History  Problem Relation Age of Onset  . Arthritis Other   . Cancer Other     colon,ovary/uterus,breast,prostate  . Hyperlipidemia Other   . Heart disease Other   . Hypertension Other   . Depression Other   . Colon cancer Neg Hx   . Stomach cancer Neg Hx   . Colon polyps Mother     Allergies  Allergen Reactions  . Penicillins Hives  . Zithromax [Azithromycin] Hives    z-max; states can take Z pack    Current Outpatient Prescriptions on File Prior to Visit  Medication Sig Dispense Refill  . albuterol (PROVENTIL HFA;VENTOLIN HFA) 108 (90 BASE) MCG/ACT inhaler Inhale 2 puffs into the lungs every 4  (four) hours as needed. For shortness of breath/wheezing  1 Inhaler  5  . aspirin 81 MG tablet Take 81 mg by mouth daily.       Marland Kitchen atorvastatin (LIPITOR) 80 MG tablet Take 0.5 tablets (40 mg total) by mouth at bedtime.  90 tablet  4  . Blood Glucose Monitoring Suppl (FREESTYLE INSULINX SYSTEM) W/DEVICE KIT by Does not apply route.      . Canagliflozin (INVOKANA) 300 MG TABS Take 1 tablet (300 mg total) by mouth daily.  30 tablet  6  . EPINEPHrine (EPI-PEN) 0.3 mg/0.3 mL DEVI Inject 0.3 mg into the muscle daily as needed. For anaphylaxis      . glucose blood (FREESTYLE TEST STRIPS) test strip Test 4 times daily.  400 each  5  . insulin aspart (NOVOLOG FLEXPEN) 100 UNIT/ML FlexPen 30 units prior to each meal. If blood sugar is greater than 160 add an additional 10 units prior to each meal  15 pen  3  . Insulin Detemir (LEVEMIR) 100 UNIT/ML Pen Inject 80 Units into the skin at bedtime.  45 mL  5  . Insulin Pen Needle 32G X 4 MM MISC Test 4 times daily.  400 each  5  . metformin (FORTAMET) 500 MG (OSM) 24 hr tablet Take 1 tablet (500 mg total) by mouth daily with breakfast.  90 tablet  3   No current facility-administered medications on file prior to visit.    BP 110/72  Pulse 90  Temp(Src) 98.1 F (36.7 C) (Oral)  Resp 20  Ht _0  (1.473 m)  Wt 158 lb (71.668 kg)  BMI 33.03 kg/m2  SpO2 98%      Review of Systems  Constitutional: Positive for fever, activity change, appetite change and fatigue.  HENT: Positive for congestion and sore throat. Negative for dental problem, hearing loss, rhinorrhea, sinus pressure and tinnitus.   Eyes: Negative for pain, discharge and visual disturbance.  Respiratory: Positive for cough. Negative for shortness of breath.   Cardiovascular: Negative for chest pain, palpitations and leg swelling.  Gastrointestinal: Negative for nausea, vomiting, abdominal pain, diarrhea, constipation, blood in stool and abdominal distention.  Genitourinary: Negative for  dysuria, urgency, frequency, hematuria, flank pain, vaginal bleeding, vaginal discharge, difficulty urinating, vaginal pain and pelvic pain.  Musculoskeletal: Negative for arthralgias, gait problem and joint swelling.  Skin: Negative for rash.  Neurological: Negative for dizziness, syncope, speech difficulty, weakness, numbness and headaches.  Hematological: Negative for adenopathy.  Psychiatric/Behavioral: Negative for behavioral problems, dysphoric mood and agitation. The patient is not nervous/anxious.        Objective:   Physical Exam  Constitutional: She is oriented to person, place, and time. She appears well-developed and well-nourished.  HENT:  Head: Normocephalic.  Right Ear: External ear normal.  Left Ear: External ear normal.  Mouth/Throat: Oropharynx is clear and moist.  Eyes: Conjunctivae and EOM are normal. Pupils are equal, round, and reactive to light.  Neck: Normal range of motion. Neck supple. No thyromegaly present.  Cardiovascular: Normal rate, regular rhythm, normal heart sounds and intact distal pulses.   Pulmonary/Chest: Effort normal and breath sounds normal.  Abdominal: Soft. Bowel sounds are normal. She exhibits no mass. There is no tenderness.  Musculoskeletal: Normal range of motion.  Lymphadenopathy:    She has no cervical adenopathy.  Neurological: She is alert and oriented to person, place, and time.  Skin: Skin is warm and dry. No rash noted.  Psychiatric: She has a normal mood and affect. Her behavior is normal.          Assessment & Plan:   Resolving viral URI with cough.  Symptoms are largely resolved.  The patient wishes to return to work tomorrow.  A note dictated on her behalf Diabetes Dyslipidemia

## 2014-04-04 ENCOUNTER — Ambulatory Visit (INDEPENDENT_AMBULATORY_CARE_PROVIDER_SITE_OTHER): Payer: 59 | Admitting: Internal Medicine

## 2014-04-04 ENCOUNTER — Encounter: Payer: Self-pay | Admitting: Internal Medicine

## 2014-04-04 VITALS — BP 120/80 | HR 88 | Temp 98.3°F | Resp 20 | Ht <= 58 in | Wt 163.0 lb

## 2014-04-04 DIAGNOSIS — E119 Type 2 diabetes mellitus without complications: Secondary | ICD-10-CM

## 2014-04-04 NOTE — Patient Instructions (Signed)
Please check your hemoglobin A1c every 3 months    It is important that you exercise regularly, at least 20 minutes 3 to 4 times per week.  If you develop chest pain or shortness of breath seek  medical attention.  You need to lose weight.  Consider a lower calorie diet and regular exercise. Diabetic/nutritional counseling as discussed

## 2014-04-04 NOTE — Progress Notes (Signed)
Pre visit review using our clinic review tool, if applicable. No additional management support is needed unless otherwise documented below in the visit note. 

## 2014-04-04 NOTE — Progress Notes (Signed)
Subjective:    Patient ID: Renee Lam, female    DOB: 1961-05-13, 53 y.o.   MRN: [id number]  HPI  53 year old patient who is seen today for followup of type 2 diabetes.  She has dyslipidemia and remains on atorvastatin.  She remains on basal bolus insulin therapy with improved glycemic control.  At the present time.  She is walking daily and also participating in a Weight Wat[employer] program.  She has received no formal nutritional counseling.\ She has had a recent gynecologic examination  Past Medical History  Diagnosis Date  . Asthma   . Diabetes mellitus   . Arthritis   . Chicken pox   . GERD (gastroesophageal reflux disease)   . Allergy   . Hypertension   . Hyperlipidemia   . Migraine   . Viral meningitis   . Pericarditis   . Finger infection 2008    isolation    History   Social History  . Marital Status: Divorced    Spouse Name: N/A    Number of Children: N/A  . Years of Education: N/A   Occupational History  . Not on file.   Social History Main Topics  . Smoking status: Never Smoker   . Smokeless tobacco: Never Used  . Alcohol Use: No  . Drug Use: No  . Sexual Activity: Not on file   Other Topics Concern  . Not on file   Social History Narrative  . No narrative on file    Past Surgical History  Procedure Laterality Date  . Breast surgery  2002    breast bx  . Cesarean section      1997, 2000  . Knee arthroscopy  2010    right    Family History  Problem Relation Age of Onset  . Arthritis Other   . Cancer Other     colon,ovary/uterus,breast,prostate  . Hyperlipidemia Other   . Heart disease Other   . Hypertension Other   . Depression Other   . Colon cancer Neg Hx   . Stomach cancer Neg Hx   . Colon polyps Mother     Allergies  Allergen Reactions  . Penicillins Hives  . Zithromax [Azithromycin] Hives    z-max; states can take Z pack    Current Outpatient Prescriptions on File Prior to Visit  Medication Sig Dispense  Refill  . albuterol (PROVENTIL HFA;VENTOLIN HFA) 108 (90 BASE) MCG/ACT inhaler Inhale 2 puffs into the lungs every 4 (four) hours as needed. For shortness of breath/wheezing  1 Inhaler  5  . aspirin 81 MG tablet Take 81 mg by mouth daily.       Marland Kitchen atorvastatin (LIPITOR) 80 MG tablet Take 0.5 tablets (40 mg total) by mouth at bedtime.  90 tablet  4  . Blood Glucose Monitoring Suppl (FREESTYLE INSULINX SYSTEM) W/DEVICE KIT by Does not apply route.      . Canagliflozin (INVOKANA) 300 MG TABS Take 1 tablet (300 mg total) by mouth daily.  30 tablet  6  . EPINEPHrine (EPI-PEN) 0.3 mg/0.3 mL DEVI Inject 0.3 mg into the muscle daily as needed. For anaphylaxis      . glucose blood (FREESTYLE TEST STRIPS) test strip Test 4 times daily.  400 each  5  . insulin aspart (NOVOLOG FLEXPEN) 100 UNIT/ML FlexPen 30 units prior to each meal. If blood sugar is greater than 160 add an additional 10 units prior to each meal  15 pen  3  . Insulin Detemir (LEVEMIR) 100  UNIT/ML Pen Inject 80 Units into the skin at bedtime.  45 mL  5  . Insulin Pen Needle 32G X 4 MM MISC Test 4 times daily.  400 each  5  . metformin (FORTAMET) 500 MG (OSM) 24 hr tablet Take 1 tablet (500 mg total) by mouth daily with breakfast.  90 tablet  3   No current facility-administered medications on file prior to visit.    BP 120/80  Pulse 88  Temp(Src) 98.3 F (36.8 C) (Oral)  Resp 20  Ht 4' 10"  (1.473 m)  Wt 163 lb (73.936 kg)  BMI 34.08 kg/m2  SpO2 98%     Review of Systems  Constitutional: Negative.   HENT: Negative for congestion, dental problem, hearing loss, rhinorrhea, sinus pressure, sore throat and tinnitus.   Eyes: Negative for pain, discharge and visual disturbance.  Respiratory: Negative for cough and shortness of breath.   Cardiovascular: Negative for chest pain, palpitations and leg swelling.  Gastrointestinal: Negative for nausea, vomiting, abdominal pain, diarrhea, constipation, blood in stool and abdominal  distention.  Genitourinary: Negative for dysuria, urgency, frequency, hematuria, flank pain, vaginal bleeding, vaginal discharge, difficulty urinating, vaginal pain and pelvic pain.  Musculoskeletal: Negative for arthralgias, gait problem and joint swelling.  Skin: Negative for rash.  Neurological: Negative for dizziness, syncope, speech difficulty, weakness, numbness and headaches.  Hematological: Negative for adenopathy.  Psychiatric/Behavioral: Negative for behavioral problems, dysphoric mood and agitation. The patient is not nervous/anxious.        Objective:   Physical Exam  Constitutional: She is oriented to person, place, and time. She appears well-developed and well-nourished.  HENT:  Head: Normocephalic.  Right Ear: External ear normal.  Left Ear: External ear normal.  Mouth/Throat: Oropharynx is clear and moist.  Eyes: Conjunctivae and EOM are normal. Pupils are equal, round, and reactive to light.  Neck: Normal range of motion. Neck supple. No thyromegaly present.  Cardiovascular: Normal rate, regular rhythm, normal heart sounds and intact distal pulses.   Pulmonary/Chest: Effort normal and breath sounds normal.  Abdominal: Soft. Bowel sounds are normal. She exhibits no mass. There is no tenderness.  Musculoskeletal: Normal range of motion.  Lymphadenopathy:    She has no cervical adenopathy.  Neurological: She is alert and oriented to person, place, and time.  Skin: Skin is warm and dry. No rash noted.  Psychiatric: She has a normal mood and affect. Her behavior is normal.          Assessment & Plan:   Diabetes mellitus.  Improve glycemic control.  We'll refer her for nutritional counseling.  Continue efforts at weight loss and better diet.  Continue present regimen Asthma stable

## 2014-04-05 ENCOUNTER — Encounter: Payer: Self-pay | Admitting: Internal Medicine

## 2014-04-16 ENCOUNTER — Telehealth: Payer: Self-pay | Admitting: Internal Medicine

## 2014-04-16 NOTE — Telephone Encounter (Signed)
Pt flying to [place] on Friday and is requestiing a letter stating she is allowed / must travel w/ her meds.

## 2014-04-19 NOTE — Telephone Encounter (Signed)
Pt stated she is taking insulin and it is in her carry on bag.  Per Dr Shawna Orleans, insulin is okay to take on a plane and pt does not need a note.  Pt notified

## 2014-04-19 NOTE — Telephone Encounter (Signed)
Please call pt by 3 pm [phone].

## 2014-05-01 ENCOUNTER — Ambulatory Visit (INDEPENDENT_AMBULATORY_CARE_PROVIDER_SITE_OTHER): Payer: 59 | Admitting: Ophthalmology

## 2014-05-03 ENCOUNTER — Ambulatory Visit: Payer: 59 | Admitting: Internal Medicine

## 2014-05-06 ENCOUNTER — Ambulatory Visit (INDEPENDENT_AMBULATORY_CARE_PROVIDER_SITE_OTHER): Payer: 59 | Admitting: Ophthalmology

## 2014-05-16 ENCOUNTER — Encounter: Payer: 59 | Attending: Internal Medicine | Admitting: *Deleted

## 2014-05-16 ENCOUNTER — Encounter: Payer: Self-pay | Admitting: *Deleted

## 2014-05-16 VITALS — Ht <= 58 in | Wt 164.9 lb

## 2014-05-16 DIAGNOSIS — E119 Type 2 diabetes mellitus without complications: Secondary | ICD-10-CM | POA: Insufficient documentation

## 2014-05-16 DIAGNOSIS — Z794 Long term (current) use of insulin: Secondary | ICD-10-CM | POA: Insufficient documentation

## 2014-05-16 DIAGNOSIS — Z713 Dietary counseling and surveillance: Secondary | ICD-10-CM | POA: Diagnosis not present

## 2014-05-16 NOTE — Progress Notes (Signed)
Diabetes Self-Management Education  Visit Type:  Initial  Appt. Start Time: 0800 Appt. End Time: 0930  05/16/2014  Renee Lam, identified by name and date of birth, is a 53 y.o. female with a diagnosis of Diabetes: Type 2.  Other people present during visit:  Patient   ASSESSMENT  Height 4\' 10"  (1.473 m), weight 164 lb 14.4 oz (74.798 kg). Body mass index is 34.47 kg/(m^2).  Initial Visit Information:  Are you currently following a meal plan?: No   Are you taking your medications as prescribed?: Yes Are you checking your feet?: No   How often do you need to have someone help you when you read instructions, pamphlets, or other written materials from your doctor or pharmacy?: 1 - Never    Psychosocial:     Patient Belief/Attitude about Diabetes: Motivated to manage diabetes Self-care barriers: None Self-management support: Doctor's office Other persons present: Patient Patient Concerns: Nutrition/Meal planning, Weight Control Special Needs: None Preferred Learning Style: Hands on Learning Readiness: Ready  Complications:   Last HgB A1C per patient/outside source: 13.1% How often do you check your blood sugar?: 1-2 times/day Fasting Blood glucose range (mg/dL): 130-179 Number of hypoglycemic episodes per month: 5 Can you tell when your blood sugar is low?: Yes (delayed symptoms) What do you do if your blood sugar is low?: candy or whatever food is available Have you had a dilated eye exam in the past 12 months?: Yes Have you had a dental exam in the past 12 months?: Yes  Diet Intake:  Breakfast: breakfast sandwich OR regular oatmeal with brown sugar, water Snack (morning): fresh fruit with PNB Lunch: brings or buys, salad with protein OR left overs from home, water Snack (afternoon): no Dinner: meat, starch - usually rice, vegetable, occasionally bread, water Snack (evening): occasionally left overs, popcorn Beverage(s): water  Exercise:  Exercise:   (none)  Individualized Plan for Diabetes Self-Management Training:   Learning Objective:  Patient will have a greater understanding of diabetes self-management.  Patient education plan per assessed needs and concerns is to attend individual sessions for     Education Topics Reviewed with Patient Today:  Definition of diabetes, type 1 and 2, and the diagnosis of diabetes Role of diet in the treatment of diabetes and the relationship between the three main macronutrients and blood glucose level, Food label reading, portion sizes and measuring food., Carbohydrate counting Role of exercise on diabetes management, blood pressure control and cardiac health. Reviewed patients medication for diabetes, action, purpose, timing of dose and side effects. (Discussed insulin action and the potential to adjust meal time dose based on food intake and BG correction doses in the future) Purpose and frequency of SMBG. (Discussed factors that affect BG results)     Role of stress on diabetes      PATIENTS GOALS/Plan (Developed by the patient):  Nutrition: Follow meal plan discussed Physical Activity: 15 minutes per day Medications: take my medication as prescribed Monitoring : test blood glucose pre and post meals as discussed  Plan:   Patient Instructions  Plan:  Aim for 2 Carb Choices per meal (30 grams) +/- 1 either way  Aim for 0-1 Carbs per snack if hungry  Include protein in moderation with your meals and snacks Consider reading food labels for Total Carbohydrate of foods Consider  increasing your activity level as tolerated Consider checking BG at alternate times per day  Continue taking medication insulin as directed by MD      Expected Outcomes:  Demonstrated interest in learning. Expect positive outcomes  Education material provided: Living Well with Diabetes, Meal plan card and Carbohydrate counting sheet, Insulin Action handout, Diabetes medication handout  If problems or  questions, patient to contact team via:  Phone and Email  Future DSME appointment: 4-6 wks

## 2014-05-16 NOTE — Patient Instructions (Signed)
Plan:  Aim for 2 Carb Choices per meal (30 grams) +/- 1 either way  Aim for 0-1 Carbs per snack if hungry  Include protein in moderation with your meals and snacks Consider reading food labels for Total Carbohydrate of foods Consider  increasing your activity level as tolerated Consider checking BG at alternate times per day  Continue taking medication insulin as directed by MD

## 2014-06-03 ENCOUNTER — Telehealth: Payer: Self-pay

## 2014-06-03 ENCOUNTER — Telehealth: Payer: Self-pay | Admitting: Internal Medicine

## 2014-06-03 NOTE — Telephone Encounter (Signed)
Her sugars have been out of control. Has been averaging 140 and her fasting was 119 this morning. After breakfast it went to 55. Before lunch it was 102 and after it was 71. Had an oragne at 3pm and her sugar still went down. She also needs Levemir samples if we have them.

## 2014-06-03 NOTE — Telephone Encounter (Signed)
[facility] - Client [facility] Patient Name: Renee Lam Gender: Female DOB: Dec 01, 1960 Age: 53 Y 76 M 1 D Return Phone Number: [phone] (Primary), [phone] (Secondary) Address: [address] City/State/Zip: [place] [address] Client [facility] - Client Client Site [place] - Day Physician Simonne Martinet Contact Type Call Call Type Triage / Clinical Relationship To Patient Self Return Phone Number [phone] (Secondary) Chief Complaint Blood Sugar Low Initial Comment Caller states she is Diabetic and started a diet and her sugars today are very low. The level was 119 this morning and after breakfast it was 55, and after lunch it was 71. PreDisposition Call Doctor Nurse Assessment Nurse: Owens Shark, RN, [place] Date/Time Eilene Ghazi Time): 06/03/2014 3:50:38 PM Confirm and document reason for call. If symptomatic, describe symptoms. ---Caller states she is Diabetic and started a diet and her sugars today are very low. The level was 119 this morning and after breakfast it was 55, and after lunch it was 71. States has begun a weight loss program. Is on Insulin; Levemir 80 units at bedtime, Novalog 30 units prior to each meal. On Metformin, also another that begins with an Ino something.? Uncertain of the name. Has the patient traveled out of the country within the last 30 days? ---No Does the patient require triage? ---Yes Related visit to physician within the last 2 weeks? ---No Does the PT have any chronic conditions? (i.e. diabetes, asthma, etc.) ---Yes List chronic conditions. ---Diabetes Type II and uncontrolled; so is on Insulin and oral medication Did the patient indicate they were pregnant? ---No Guidelines Guideline Title Affirmed Question Affirmed Notes Nurse Date/Time (Eastern Time) Diabetes - Low Blood Sugar Caller has  URGENT medication or insulin pump question and triager unable to answer question Owens Shark, RN, Lyn 06/03/2014 3:56:03 PM Disp. Time Eilene Ghazi Time) Disposition Final User 06/03/2014 4:03:18 PM Call PCP Now Yes Owens Shark, RN, Lyn PLEASE NOTE: All timestamps contained within this report are represented as [place] Standard Time. CONFIDENTIALTY NOTICE: This fax transmission is intended only for the addressee. It contains information that is legally privileged, confidential or otherwise protected from use or disclosure. If you are not the intended recipient, you are strictly prohibited from reviewing, disclosing, copying using or disseminating any of this information or taking any action in reliance on or regarding this information. If you have received this fax in error, please notify us immediately by telephone so that we can arrange for its return to [place]. Phone: [phone], Toll-Free: [phone], Fax: [phone] Page: 2 of 2 Call Id: [id number] Caller Understands: Yes Disagree/Comply: Comply Care Advice Given Per Guideline CALL PCP NOW: You need to discuss this with your doctor. I'll page him now. If you haven't heard from the on-call doctor within 30 minutes, call again. CARE ADVICE given per Diabetes - Low Blood Sugar (Adult) guideline. After Care Instructions Given Call Event Type User Date / Time Description Comments User: Dudley Major, RN Date/Time Eilene Ghazi Time): 06/03/2014 4:03:36 PM Had the caller re-take her Blood Sugar and it went down to 67 after eating an orange about an hour ago. Warm transferred her to the office to [facility] for consult with her PCP. Referrals REFERRED TO PCP OFFICE

## 2014-06-03 NOTE — Telephone Encounter (Signed)
Please see message and advise 

## 2014-06-03 NOTE — Telephone Encounter (Signed)
Spoke to pt, told her discussed sugars with Dr. Raliegh Ip he said to decrease mealtime and bedtime insulin in half for now and will probably have to go back up some but for now do this. Continue to monitor sugars and let us know how they are. Pt verbalized understanding and asked if we have sample of Levemir? Told pt yes, she can stop by tomorrow for sample. Pt verbalized understanding.

## 2014-06-03 NOTE — Telephone Encounter (Signed)
Decrease insulin therapy by 50%

## 2014-06-03 NOTE — Telephone Encounter (Signed)
See other message concerning this with pt.

## 2014-06-04 ENCOUNTER — Ambulatory Visit (INDEPENDENT_AMBULATORY_CARE_PROVIDER_SITE_OTHER): Payer: 59 | Admitting: Ophthalmology

## 2014-06-06 ENCOUNTER — Telehealth: Payer: Self-pay | Admitting: Internal Medicine

## 2014-06-06 NOTE — Telephone Encounter (Signed)
Please see message and advise 

## 2014-06-06 NOTE — Telephone Encounter (Signed)
Pt called to say her fasting  blood sugar is between  146 to 160 and today 06/06/14 it was 218. Doing the day is has been 102 to 114

## 2014-06-06 NOTE — Telephone Encounter (Signed)
Pt called back, told her to increase Levemir by 4 units and keep scheduled appointment for next month per Dr. Raliegh Ip. Pt verbalized understanding.

## 2014-06-06 NOTE — Telephone Encounter (Signed)
Increase Levemir by 4 units Return office visit with hemoglobin A1c next month as scheduled

## 2014-06-06 NOTE — Telephone Encounter (Signed)
Left detailed message for pt to increase Levemir by 4 units per Dr. Raliegh Ip and keep follow up appt as scheduled for next month. Call if any questions.

## 2014-06-24 ENCOUNTER — Encounter (HOSPITAL_COMMUNITY): Payer: Self-pay | Admitting: *Deleted

## 2014-06-24 ENCOUNTER — Emergency Department (HOSPITAL_COMMUNITY)
Admission: EM | Admit: 2014-06-24 | Discharge: 2014-06-24 | Disposition: A | Discharge status: Home / Self Care | Payer: 59 | Source: Home / Self Care | Attending: Family Medicine | Admitting: Family Medicine

## 2014-06-24 DIAGNOSIS — G44201 Tension-type headache, unspecified, intractable: Secondary | ICD-10-CM

## 2014-06-24 DIAGNOSIS — H65191 Other acute nonsuppurative otitis media, right ear: Secondary | ICD-10-CM

## 2014-06-24 MED ORDER — ONDANSETRON HCL 4 MG PO TABS: 4.0000 mg | ORAL_TABLET | Freq: Four times a day (QID) | ORAL | Status: DC

## 2014-06-24 MED ORDER — CEFUROXIME AXETIL 250 MG PO TABS: 250.0000 mg | ORAL_TABLET | Freq: Two times a day (BID) | ORAL | Status: DC

## 2014-06-24 MED ORDER — KETOROLAC TROMETHAMINE 10 MG PO TABS: ORAL_TABLET | ORAL | Status: DC

## 2014-06-24 NOTE — Discharge Instructions (Signed)
Tension Headache A tension headache is a feeling of pain, pressure, or aching often felt over the front and sides of the head. The pain can be dull or can feel tight (constricting). It is the most common type of headache. Tension headaches are not normally associated with nausea or vomiting and do not get worse with physical activity. Tension headaches can last 30 minutes to several days.  CAUSES  The exact cause is not known, but it may be caused by chemicals and hormones in the brain that lead to pain. Tension headaches often begin after stress, anxiety, or depression. Other triggers may include:  Alcohol.  Caffeine (too much or withdrawal).  Respiratory infections (colds, flu, sinus infections).  Dental problems or teeth clenching.  Fatigue.  Holding your head and neck in one position too long while using a computer. SYMPTOMS   Pressure around the head.   Dull, aching head pain.   Pain felt over the front and sides of the head.   Tenderness in the muscles of the head, neck, and shoulders. DIAGNOSIS  A tension headache is often diagnosed based on:   Symptoms.   Physical examination.   A CT scan or MRI of your head. These tests may be ordered if symptoms are severe or unusual. TREATMENT  Medicines may be given to help relieve symptoms.  HOME CARE INSTRUCTIONS   Only take over-the-counter or prescription medicines for pain or discomfort as directed by your caregiver.   Lie down in a dark, quiet room when you have a headache.   Keep a journal to find out what may be triggering your headaches. For example, write down:  What you eat and drink.  How much sleep you get.  Any change to your diet or medicines.  Try massage or other relaxation techniques.   Ice packs or heat applied to the head and neck can be used. Use these 3 to 4 times per day for 15 to 20 minutes each time, or as needed.   Limit stress.   Sit up straight, and do not tense your muscles.    Quit smoking if you smoke.  Limit alcohol use.  Decrease the amount of caffeine you drink, or stop drinking caffeine.  Eat and exercise regularly.  Get 7 to 9 hours of sleep, or as recommended by your caregiver.  Avoid excessive use of pain medicine as recurrent headaches can occur.  SEEK MEDICAL CARE IF:   You have problems with the medicines you were prescribed.  Your medicines do not work.  You have a change from the usual headache.  You have nausea or vomiting. SEEK IMMEDIATE MEDICAL CARE IF:   Your headache becomes severe.  You have a fever.  You have a stiff neck.  You have loss of vision.  You have muscular weakness or loss of muscle control.  You lose your balance or have trouble walking.  You feel faint or pass out.  You have severe symptoms that are different from your first symptoms. MAKE SURE YOU:   Understand these instructions.  Will watch your condition.  Will get help right away if you are not doing well or get worse. Document Released: 05/31/2005 Document Revised: 08/23/2011 Document Reviewed: 05/21/2011 [facility] Patient Information 2015 [place], [place]. This information is not intended to replace advice given to you by your health care provider. Make sure you discuss any questions you have with your health care provider.  Headaches, Frequently Asked Questions MIGRAINE HEADACHES Q: What is migraine? What causes  it? How can I treat it? A: Generally, migraine headaches begin as a dull ache. Then they develop into a constant, throbbing, and pulsating pain. You may experience pain at the temples. You may experience pain at the front or back of one or both sides of the head. The pain is usually accompanied by a combination of:  Nausea.  Vomiting.  Sensitivity to light and noise. Some people (about 15%) experience an aura (see below) before an attack. The cause of migraine is believed to be chemical reactions in the brain. Treatment for  migraine may include over-the-counter or prescription medications. It may also include self-help techniques. These include relaxation training and biofeedback.  Q: What is an aura? A: About 15% of people with migraine get an "aura". This is a sign of neurological symptoms that occur before a migraine headache. You may see wavy or jagged lines, dots, or flashing lights. You might experience tunnel vision or blind spots in one or both eyes. The aura can include visual or auditory hallucinations (something imagined). It may include disruptions in smell (such as strange odors), taste or touch. Other symptoms include:  Numbness.  A "pins and needles" sensation.  Difficulty in recalling or speaking the correct word. These neurological events may last as long as 60 minutes. These symptoms will fade as the headache begins. Q: What is a trigger? A: Certain physical or environmental factors can lead to or "trigger" a migraine. These include:  Foods.  Hormonal changes.  Weather.  Stress. It is important to remember that triggers are different for everyone. To help prevent migraine attacks, you need to figure out which triggers affect you. Keep a headache diary. This is a good way to track triggers. The diary will help you talk to your healthcare professional about your condition. Q: Does weather affect migraines? A: Bright sunshine, hot, humid conditions, and drastic changes in barometric pressure may lead to, or "trigger," a migraine attack in some people. But studies have shown that weather does not act as a trigger for everyone with migraines. Q: What is the link between migraine and hormones? A: Hormones start and regulate many of your body's functions. Hormones keep your body in balance within a constantly changing environment. The levels of hormones in your body are unbalanced at times. Examples are during menstruation, pregnancy, or menopause. That can lead to a migraine attack. In fact, about  three quarters of all women with migraine report that their attacks are related to the menstrual cycle.  Q: Is there an increased risk of stroke for migraine sufferers? A: The likelihood of a migraine attack causing a stroke is very remote. That is not to say that migraine sufferers cannot have a stroke associated with their migraines. In persons under age 39, the most common associated factor for stroke is migraine headache. But over the course of a person's normal life span, the occurrence of migraine headache may actually be associated with a reduced risk of dying from cerebrovascular disease due to stroke.  Q: What are acute medications for migraine? A: Acute medications are used to treat the pain of the headache after it has started. Examples over-the-counter medications, NSAIDs, ergots, and triptans.  Q: What are the triptans? A: Triptans are the newest class of abortive medications. They are specifically targeted to treat migraine. Triptans are vasoconstrictors. They moderate some chemical reactions in the brain. The triptans work on receptors in your brain. Triptans help to restore the balance of a neurotransmitter called serotonin. Fluctuations in  levels of serotonin are thought to be a main cause of migraine.  Q: Are over-the-counter medications for migraine effective? A: Over-the-counter, or "OTC," medications may be effective in relieving mild to moderate pain and associated symptoms of migraine. But you should see your caregiver before beginning any treatment regimen for migraine.  Q: What are preventive medications for migraine? A: Preventive medications for migraine are sometimes referred to as "prophylactic" treatments. They are used to reduce the frequency, severity, and length of migraine attacks. Examples of preventive medications include antiepileptic medications, antidepressants, beta-blockers, calcium channel blockers, and NSAIDs (nonsteroidal anti-inflammatory drugs). Q: Why are  anticonvulsants used to treat migraine? A: During the past few years, there has been an increased interest in antiepileptic drugs for the prevention of migraine. They are sometimes referred to as "anticonvulsants". Both epilepsy and migraine may be caused by similar reactions in the brain.  Q: Why are antidepressants used to treat migraine? A: Antidepressants are typically used to treat people with depression. They may reduce migraine frequency by regulating chemical levels, such as serotonin, in the brain.  Q: What alternative therapies are used to treat migraine? A: The term "alternative therapies" is often used to describe treatments considered outside the scope of conventional Western medicine. Examples of alternative therapy include acupuncture, acupressure, and yoga. Another common alternative treatment is herbal therapy. Some herbs are believed to relieve headache pain. Always discuss alternative therapies with your caregiver before proceeding. Some herbal products contain arsenic and other toxins. TENSION HEADACHES Q: What is a tension-type headache? What causes it? How can I treat it? A: Tension-type headaches occur randomly. They are often the result of temporary stress, anxiety, fatigue, or anger. Symptoms include soreness in your temples, a tightening band-like sensation around your head (a "vice-like" ache). Symptoms can also include a pulling feeling, pressure sensations, and contracting head and neck muscles. The headache begins in your forehead, temples, or the back of your head and neck. Treatment for tension-type headache may include over-the-counter or prescription medications. Treatment may also include self-help techniques such as relaxation training and biofeedback. CLUSTER HEADACHES Q: What is a cluster headache? What causes it? How can I treat it? A: Cluster headache gets its name because the attacks come in groups. The pain arrives with little, if any, warning. It is usually on  one side of the head. A tearing or bloodshot eye and a runny nose on the same side of the headache may also accompany the pain. Cluster headaches are believed to be caused by chemical reactions in the brain. They have been described as the most severe and intense of any headache type. Treatment for cluster headache includes prescription medication and oxygen. SINUS HEADACHES Q: What is a sinus headache? What causes it? How can I treat it? A: When a cavity in the bones of the face and skull (a sinus) becomes inflamed, the inflammation will cause localized pain. This condition is usually the result of an allergic reaction, a tumor, or an infection. If your headache is caused by a sinus blockage, such as an infection, you will probably have a fever. An x-ray will confirm a sinus blockage. Your caregiver's treatment might include antibiotics for the infection, as well as antihistamines or decongestants.  REBOUND HEADACHES Q: What is a rebound headache? What causes it? How can I treat it? A: A pattern of taking acute headache medications too often can lead to a condition known as "rebound headache." A pattern of taking too much headache medication includes taking it  more than 2 days per week or in excessive amounts. That means more than the label or a caregiver advises. With rebound headaches, your medications not only stop relieving pain, they actually begin to cause headaches. Doctors treat rebound headache by tapering the medication that is being overused. Sometimes your caregiver will gradually substitute a different type of treatment or medication. Stopping may be a challenge. Regularly overusing a medication increases the potential for serious side effects. Consult a caregiver if you regularly use headache medications more than 2 days per week or more than the label advises. ADDITIONAL QUESTIONS AND ANSWERS Q: What is biofeedback? A: Biofeedback is a self-help treatment. Biofeedback uses special equipment  to monitor your body's involuntary physical responses. Biofeedback monitors:  Breathing.  Pulse.  Heart rate.  Temperature.  Muscle tension.  Brain activity. Biofeedback helps you refine and perfect your relaxation exercises. You learn to control the physical responses that are related to stress. Once the technique has been mastered, you do not need the equipment any more. Q: Are headaches hereditary? A: Four out of five (80%) of people that suffer report a family history of migraine. Scientists are not sure if this is genetic or a family predisposition. Despite the uncertainty, a child has a 50% chance of having migraine if one parent suffers. The child has a 75% chance if both parents suffer.  Q: Can children get headaches? A: By the time they reach high school, most young people have experienced some type of headache. Many safe and effective approaches or medications can prevent a headache from occurring or stop it after it has begun.  Q: What type of doctor should I see to diagnose and treat my headache? A: Start with your primary caregiver. Discuss his or her experience and approach to headaches. Discuss methods of classification, diagnosis, and treatment. Your caregiver may decide to recommend you to a headache specialist, depending upon your symptoms or other physical conditions. Having diabetes, allergies, etc., may require a more comprehensive and inclusive approach to your headache. The National Head[employer] Foundation will provide, upon request, a list of [facility] physician members in your state. Document Released: 08/21/2003 Document Revised: 08/23/2011 Document Reviewed: 01/29/2008 [facility] Patient Information 2015 [place], [place]. This information is not intended to replace advice given to you by your health care provider. Make sure you discuss any questions you have with your health care provider.

## 2014-06-24 NOTE — ED Provider Notes (Signed)
CSN: [id number]     Arrival date & time 06/24/14  1922 History   First MD Initiated Contact with Patient 06/24/14 2004     Chief Complaint  Patient presents with  . Headache   (Consider location/radiation/quality/duration/timing/severity/associated sxs/prior Treatment) HPI Comments: 54 year old female complaining of a headache for 3 weeks. His primary located in the right occiput and right posterior lateral neck. Headache also is across the frontal lobe. Her ears feel full   Past Medical History  Diagnosis Date  . Asthma   . Diabetes mellitus   . Arthritis   . Chicken pox   . GERD (gastroesophageal reflux disease)   . Allergy   . Hypertension   . Hyperlipidemia   . Migraine   . Viral meningitis   . Pericarditis   . Finger infection 2008    isolation   Past Surgical History  Procedure Laterality Date  . Breast surgery  2002    breast bx  . Cesarean section      1997, 2000  . Knee arthroscopy  2010    right  . Cardiac catheterization  2012   Family History  Problem Relation Age of Onset  . Arthritis Other   . Cancer Other     colon,ovary/uterus,breast,prostate  . Hyperlipidemia Other   . Heart disease Other   . Hypertension Other   . Depression Other   . Colon cancer Neg Hx   . Stomach cancer Neg Hx   . Colon polyps Mother   . Diabetes Mother   . Heart failure Mother   . Diabetes Father   . Heart disease Father   . Arthritis Father   . Hypertension Father   . Heart failure Father    History  Substance Use Topics  . Smoking status: Never Smoker   . Smokeless tobacco: Never Used  . Alcohol Use: No     Comment: once a year   OB History    No data available     Review of Systems  Allergies  Food; Penicillins; and Zithromax  Home Medications   Prior to Admission medications   Medication Sig Start Date End Date Taking? Authorizing Provider  aspirin 81 MG tablet Take 81 mg by mouth daily.    Yes Historical Provider, MD   Aspirin-Acetaminophen-Caffeine (EXCEDRIN EXTRA STRENGTH PO) Take by mouth as needed.   Yes Historical Provider, MD  atorvastatin (LIPITOR) 80 MG tablet Take 0.5 tablets (40 mg total) by mouth at bedtime. 02/01/14 04/20/15 Yes Marletta Lor, MD  Blood Glucose Monitoring Suppl ([place]) W/DEVICE KIT by Does not apply route.   Yes Historical Provider, MD  Canagliflozin (INVOKANA) 300 MG TABS Take 1 tablet (300 mg total) by mouth daily. 02/01/14  Yes Marletta Lor, MD  glucose blood (FREESTYLE TEST STRIPS) test strip Test 4 times daily. 02/01/14  Yes Marletta Lor, MD  Insulin Detemir (LEVEMIR) 100 UNIT/ML Pen Inject 80 Units into the skin at bedtime. Patient taking differently: Inject 45 Units into the skin at bedtime.  02/01/14  Yes Marletta Lor, MD  Insulin Pen Needle 32G X 4 MM MISC Test 4 times daily. 02/01/14  Yes Marletta Lor, MD  metformin (FORTAMET) 500 MG (OSM) 24 hr tablet Take 1 tablet (500 mg total) by mouth daily with breakfast. 06/21/13  Yes Marletta Lor, MD  phenylephrine (SUDAFED PE) 10 MG TABS tablet Take 10 mg by mouth every 6 (six) hours as needed.   Yes Historical Provider, MD  albuterol (  PROVENTIL HFA;VENTOLIN HFA) 108 (90 BASE) MCG/ACT inhaler Inhale 2 puffs into the lungs every 4 (four) hours as needed. For shortness of breath/wheezing 02/01/14 05/11/15  Marletta Lor, MD  cefUROXime (CEFTIN) 250 MG tablet Take 1 tablet (250 mg total) by mouth 2 (two) times daily with a meal. 06/24/14   Janne Napoleon, NP  diphenhydrAMINE (BENADRYL) 25 MG tablet Take 25 mg by mouth every 6 (six) hours as needed.    Historical Provider, MD  EPINEPHrine (EPI-PEN) 0.3 mg/0.3 mL DEVI Inject 0.3 mg into the muscle daily as needed. For anaphylaxis    Historical Provider, MD  insulin aspart (NOVOLOG FLEXPEN) 100 UNIT/ML FlexPen 30 units prior to each meal. If blood sugar is greater than 160 add an additional 10 units prior to each meal Patient taking  differently: 15 Units. 30 units prior to each meal. If blood sugar is greater than 160 add an additional 10 units prior to each meal 02/08/14   Marletta Lor, MD  ketorolac (TORADOL) 10 MG tablet Take 2 tablets first dose, then 1 q 8h prn pain 06/24/14   Janne Napoleon, NP  naproxen sodium (ANAPROX) 220 MG tablet Take 220 mg by mouth as needed.    Historical Provider, MD  ondansetron (ZOFRAN) 4 MG tablet Take 1 tablet (4 mg total) by mouth every 6 (six) hours. 06/24/14   Janne Napoleon, NP   BP 128/78 mmHg  Pulse 87  Temp(Src) 97.9 F (36.6 C) (Oral)  Resp 16  SpO2 97% Physical Exam  HENT:  Right TM with deep erythema. No bulging. Left TM retracted.    ED Course  Procedures (including critical care time) Labs Review Labs Reviewed - No data to display  Imaging Review No results found.   MDM   1. Intractable tension-type headache, unspecified chronicity pattern   2. Other acute nonsuppurative otitis media of right ear    Toradol 10 mg tablets 2 now and then one every 8 hours when necessary pain Zofran 4 mg every 8 hours when necessary nausea Ceftin 250 mg as directed for right otitis media. Follow-up with your PCP as needed. Apply heat to the back of your neck. Concentrate on ergonomics at work and try not to hold the head forward as in looking down at a computer or phone screen.       Janne Napoleon, NP 06/24/14 2030

## 2014-06-24 NOTE — ED Notes (Addendum)
C/o headache every day around 1330-1400.  Pain radiates into her neck and shoulders sometimes.  Takes Excedrin Migraine with relief sometimes.  Had hypertension before she got divorced.  She said her ears feel full like they need to pop (Like being in an airplane). Occasional stuffy nose high up in her nose.  C/o nausea.

## 2014-06-25 ENCOUNTER — Ambulatory Visit: Payer: 59 | Admitting: Internal Medicine

## 2014-06-27 ENCOUNTER — Ambulatory Visit: Payer: 59 | Admitting: *Deleted

## 2014-07-08 ENCOUNTER — Ambulatory Visit (INDEPENDENT_AMBULATORY_CARE_PROVIDER_SITE_OTHER): Payer: 59 | Admitting: Ophthalmology

## 2014-07-19 ENCOUNTER — Ambulatory Visit: Payer: Self-pay | Admitting: Internal Medicine

## 2014-07-24 ENCOUNTER — Encounter (HOSPITAL_COMMUNITY): Payer: Self-pay | Admitting: Emergency Medicine

## 2014-07-24 ENCOUNTER — Emergency Department (HOSPITAL_COMMUNITY)
Admission: EM | Admit: 2014-07-24 | Discharge: 2014-07-25 | Disposition: A | Discharge status: Home / Self Care | Payer: 59 | Attending: Emergency Medicine | Admitting: Emergency Medicine

## 2014-07-24 DIAGNOSIS — E785 Hyperlipidemia, unspecified: Secondary | ICD-10-CM | POA: Insufficient documentation

## 2014-07-24 DIAGNOSIS — I10 Essential (primary) hypertension: Secondary | ICD-10-CM | POA: Diagnosis not present

## 2014-07-24 DIAGNOSIS — M1990 Unspecified osteoarthritis, unspecified site: Secondary | ICD-10-CM | POA: Insufficient documentation

## 2014-07-24 DIAGNOSIS — Z794 Long term (current) use of insulin: Secondary | ICD-10-CM | POA: Insufficient documentation

## 2014-07-24 DIAGNOSIS — Z9889 Other specified postprocedural states: Secondary | ICD-10-CM | POA: Diagnosis not present

## 2014-07-24 DIAGNOSIS — E119 Type 2 diabetes mellitus without complications: Secondary | ICD-10-CM | POA: Insufficient documentation

## 2014-07-24 DIAGNOSIS — Z872 Personal history of diseases of the skin and subcutaneous tissue: Secondary | ICD-10-CM | POA: Diagnosis not present

## 2014-07-24 DIAGNOSIS — Z791 Long term (current) use of non-steroidal anti-inflammatories (NSAID): Secondary | ICD-10-CM | POA: Diagnosis not present

## 2014-07-24 DIAGNOSIS — Z8619 Personal history of other infectious and parasitic diseases: Secondary | ICD-10-CM | POA: Diagnosis not present

## 2014-07-24 DIAGNOSIS — Z880 Allergy status to penicillin: Secondary | ICD-10-CM | POA: Diagnosis not present

## 2014-07-24 DIAGNOSIS — Z7982 Long term (current) use of aspirin: Secondary | ICD-10-CM | POA: Diagnosis not present

## 2014-07-24 DIAGNOSIS — J4520 Mild intermittent asthma, uncomplicated: Secondary | ICD-10-CM

## 2014-07-24 DIAGNOSIS — J4521 Mild intermittent asthma with (acute) exacerbation: Secondary | ICD-10-CM | POA: Insufficient documentation

## 2014-07-24 DIAGNOSIS — J45909 Unspecified asthma, uncomplicated: Secondary | ICD-10-CM | POA: Diagnosis present

## 2014-07-24 MED ORDER — IPRATROPIUM-ALBUTEROL 0.5-2.5 (3) MG/3ML IN SOLN
3.0000 mL | Freq: Once | RESPIRATORY_TRACT | Status: AC
  Administered 2014-07-24: 3 mL via RESPIRATORY_TRACT
  Filled 2014-07-24: qty 3

## 2014-07-24 MED ORDER — ALBUTEROL SULFATE HFA 108 (90 BASE) MCG/ACT IN AERS
2.0000 | INHALATION_SPRAY | Freq: Once | RESPIRATORY_TRACT | Status: AC
  Administered 2014-07-24: 2 via RESPIRATORY_TRACT
  Filled 2014-07-24: qty 6.7

## 2014-07-24 NOTE — ED Provider Notes (Signed)
CSN: [id number]     Arrival date & time 07/24/14  2138 History   First MD Initiated Contact with Patient 07/24/14 2154     Chief Complaint  Patient presents with  . Asthma     (Consider location/radiation/quality/duration/timing/severity/associated sxs/prior Treatment) Patient is a 54 y.o. female presenting with asthma and wheezing. The history is provided by the patient.  Asthma This is a chronic problem. The current episode started in the past 7 days. The problem occurs 2 to 4 times per day. The problem has been gradually worsening. Associated symptoms include coughing. Pertinent negatives include no abdominal pain, chest pain, chills, congestion, diaphoresis, fever, headaches, nausea, neck pain, rash, sore throat, vomiting or weakness. Exacerbated by: cold air. Treatments tried: inhaler. The treatment provided significant relief.  Wheezing Severity:  Mild Severity compared to prior episodes:  Similar Onset quality:  Gradual Duration:  3 days Timing:  Intermittent Progression:  Improving Relieved by:  Beta-agonist inhaler Exacerbated by: cold air. Associated symptoms: cough   Associated symptoms: no chest pain, no fever, no headaches, no rash, no rhinorrhea, no shortness of breath, no sore throat and no sputum production   Risk factors: no prior ICU admissions and no prior intubations     Past Medical History  Diagnosis Date  . Asthma   . Diabetes mellitus   . Arthritis   . Chicken pox   . GERD (gastroesophageal reflux disease)   . Allergy   . Hypertension   . Hyperlipidemia   . Migraine   . Viral meningitis   . Pericarditis   . Finger infection 2008    isolation   Past Surgical History  Procedure Laterality Date  . Breast surgery  2002    breast bx  . Cesarean section      1997, 2000  . Knee arthroscopy  2010    right  . Cardiac catheterization  2012   Family History  Problem Relation Age of Onset  . Arthritis Other   . Cancer Other    colon,ovary/uterus,breast,prostate  . Hyperlipidemia Other   . Heart disease Other   . Hypertension Other   . Depression Other   . Colon cancer Neg Hx   . Stomach cancer Neg Hx   . Colon polyps Mother   . Diabetes Mother   . Heart failure Mother   . Diabetes Father   . Heart disease Father   . Arthritis Father   . Hypertension Father   . Heart failure Father    History  Substance Use Topics  . Smoking status: Never Smoker   . Smokeless tobacco: Never Used  . Alcohol Use: No     Comment: once a year   OB History    No data available     Review of Systems  Constitutional: Negative for fever, chills and diaphoresis.  HENT: Negative for congestion, rhinorrhea and sore throat.   Eyes: Negative for visual disturbance.  Respiratory: Positive for cough and wheezing. Negative for sputum production and shortness of breath.   Cardiovascular: Negative for chest pain, palpitations and leg swelling.  Gastrointestinal: Negative for nausea, vomiting, abdominal pain, diarrhea and constipation.  Genitourinary: Negative for dysuria, hematuria, vaginal bleeding and vaginal discharge.  Musculoskeletal: Negative for back pain and neck pain.  Skin: Negative for rash.  Neurological: Negative for weakness and headaches.  All other systems reviewed and are negative.     Allergies  Food; Penicillins; and Zithromax  Home Medications   Prior to Admission medications   Medication Sig  Start Date End Date Taking? Authorizing Provider  albuterol (PROVENTIL HFA;VENTOLIN HFA) 108 (90 BASE) MCG/ACT inhaler Inhale 2 puffs into the lungs every 4 (four) hours as needed. For shortness of breath/wheezing 02/01/14 05/11/15  Marletta Lor, MD  aspirin 81 MG tablet Take 81 mg by mouth daily.     Historical Provider, MD  Aspirin-Acetaminophen-Caffeine (EXCEDRIN EXTRA STRENGTH PO) Take by mouth as needed.    Historical Provider, MD  atorvastatin (LIPITOR) 80 MG tablet Take 0.5 tablets (40 mg total) by  mouth at bedtime. 02/01/14 04/20/15  Marletta Lor, MD  Blood Glucose Monitoring Suppl ([place]) W/DEVICE KIT by Does not apply route.    Historical Provider, MD  Canagliflozin (INVOKANA) 300 MG TABS Take 1 tablet (300 mg total) by mouth daily. 02/01/14   Marletta Lor, MD  cefUROXime (CEFTIN) 250 MG tablet Take 1 tablet (250 mg total) by mouth 2 (two) times daily with a meal. 06/24/14   Janne Napoleon, NP  diphenhydrAMINE (BENADRYL) 25 MG tablet Take 25 mg by mouth every 6 (six) hours as needed.    Historical Provider, MD  EPINEPHrine (EPI-PEN) 0.3 mg/0.3 mL DEVI Inject 0.3 mg into the muscle daily as needed. For anaphylaxis    Historical Provider, MD  glucose blood (FREESTYLE TEST STRIPS) test strip Test 4 times daily. 02/01/14   Marletta Lor, MD  insulin aspart (NOVOLOG FLEXPEN) 100 UNIT/ML FlexPen 30 units prior to each meal. If blood sugar is greater than 160 add an additional 10 units prior to each meal Patient taking differently: 15 Units. 30 units prior to each meal. If blood sugar is greater than 160 add an additional 10 units prior to each meal 02/08/14   Marletta Lor, MD  Insulin Detemir (LEVEMIR) 100 UNIT/ML Pen Inject 80 Units into the skin at bedtime. Patient taking differently: Inject 45 Units into the skin at bedtime.  02/01/14   Marletta Lor, MD  Insulin Pen Needle 32G X 4 MM MISC Test 4 times daily. 02/01/14   Marletta Lor, MD  ketorolac (TORADOL) 10 MG tablet Take 2 tablets first dose, then 1 q 8h prn pain 06/24/14   Janne Napoleon, NP  metformin (FORTAMET) 500 MG (OSM) 24 hr tablet Take 1 tablet (500 mg total) by mouth daily with breakfast. 06/21/13   Marletta Lor, MD  naproxen sodium (ANAPROX) 220 MG tablet Take 220 mg by mouth as needed.    Historical Provider, MD  ondansetron (ZOFRAN) 4 MG tablet Take 1 tablet (4 mg total) by mouth every 6 (six) hours. 06/24/14   Janne Napoleon, NP  phenylephrine (SUDAFED PE) 10 MG TABS tablet Take 10 mg  by mouth every 6 (six) hours as needed.    Historical Provider, MD   BP 158/80 mmHg  Pulse 87  Temp(Src) 98 F (36.7 C) (Oral)  Resp 18  SpO2 100% Physical Exam  Constitutional: She is oriented to person, place, and time. She appears well-developed and well-nourished. No distress.  HENT:  Head: Normocephalic and atraumatic.  Eyes: Conjunctivae are normal.  Neck: Normal range of motion.  Cardiovascular: Normal rate, regular rhythm, normal heart sounds and intact distal pulses.   No murmur heard. Pulmonary/Chest: Effort normal and breath sounds normal. No respiratory distress. She has no wheezes. She has no rales. She exhibits no tenderness.  Abdominal: Soft. Bowel sounds are normal. She exhibits no distension.  Musculoskeletal: Normal range of motion.  Neurological: She is alert and oriented to person, place, and time.  No cranial nerve deficit.  Skin: Skin is warm and dry.  Psychiatric: She has a normal mood and affect.  Nursing note and vitals reviewed.   ED Course  Procedures (including critical care time) Labs Review Labs Reviewed - No data to display  Imaging Review No results found.   EKG Interpretation   Date/Time:  Wednesday July 24 2014 21:55:33 EST Ventricular Rate:  85 PR Interval:  140 QRS Duration: 80 QT Interval:  356 QTC Calculation: 423 R Axis:   81 Text Interpretation:  Normal sinus rhythm Cannot rule out Anterior infarct  , age undetermined Abnormal ECG No significant change since last tracing  Confirmed by KNAPP  MD-J, JON ([id number]) on 07/24/2014 10:03:07 PM      MDM   Final diagnoses:  None   Yanisa Goodgame is a 54 y.o. female with H&P as above. -Pertinent historical findings: asthmatic. Wheezing for few days. ran out of inhaler. Couldn't get filled. -Initial impression: well appearing. No resp distress, no tachypnea, no hypoxia. No wheezing. Slightly tight breath sounds.  -Ordered: breathing tx. -Re-evaluation: still no resp  distress. VSS -Disposition: d/c home with close pcp f/u  Clinical Impression: 1. Asthma, mild intermittent, uncomplicated     Disposition: Discharge  Condition: Good  I have discussed the results, Dx and Tx plan with the pt(& family if present). He/she/they expressed understanding and agree(s) with the plan. Discharge instructions discussed at great length. Strict return precautions discussed and pt &/or family have verbalized understanding of the instructions. No further questions at time of discharge.    Discharge Medication List as of 07/24/2014 11:42 PM      Follow Up: Marletta Lor, MD [place] [id number] [phone]  Schedule an appointment as soon as possible for a visit in 1 week As needed  [facility] [address] [id number] [place] [phone]  If symptoms worsen   Pt seen in conjunction with Dr. Alba Destine, [facility] Resident - PGY-2      Kirstie Peri, MD 07/25/14 Benancio Deeds  Dorie Rank, MD 07/25/14 [phone]

## 2014-07-24 NOTE — ED Notes (Signed)
Resident at bedside.  

## 2014-07-24 NOTE — Discharge Instructions (Signed)

## 2014-07-24 NOTE — ED Notes (Signed)
Pt. reports asthma attack with dry cough , wheezing and chest tightness unrelieved by rescue MDI .

## 2014-07-25 ENCOUNTER — Telehealth: Payer: Self-pay

## 2014-07-25 NOTE — Telephone Encounter (Signed)
[facility] Night - Client TELEPHONE [facility] Patient Name: Renee Lam Gender: Female DOB: 10/24/1950 Age: 54 Y 39 M 29 D Return Phone Number: [phone] (Primary) Address: City/State/Zip: [place] [address] Client [facility] Night - Client Client Site [facility] - Night Physician Simonne Martinet Contact Type Call Call Type Triage / Clinical Relationship To Patient Self Return Phone Number [phone] (Primary) Chief Complaint ASTHMA ATTACK Initial Comment Caller states she is experiencing difficulty breathing due to asthma attack. Inhaler received in mail order was defective. PreDisposition InappropriateToAsk Nurse Assessment Nurse: Justine Null, RN, Rodena Piety Date/Time (Eastern Time): 07/24/2014 8:42:55 PM Confirm and document reason for call. If symptomatic, describe symptoms. ---Caller states she is experiencing difficulty breathing due to asthma attack. Inhaler received in mail order was defective. caller stated that she has been having difficulty breathing started 3 days and she just opened her inhaler and it is not working Has the patient traveled out of the country within the last 30 days? ---No Does the patient require triage? ---Yes Related visit to physician within the last 2 weeks? ---Yes Does the PT have any chronic conditions? (i.e. diabetes, asthma, etc.) ---Yes List chronic conditions. ---asthma diabetes type II high cholesterol Guidelines Guideline Title Affirmed Question Affirmed Notes Nurse Date/Time (Eastern Time) Asthma Attack [1] SEVERE asthma attack (e.g., very SOB at rest, speaks in single words, loud wheezes) AND [2] not resolved after 2 nebulizer or inhaler treatments Justine Null, RN, Rodena Piety 07/24/2014 8:45:03 PM Disp. Time Eilene Ghazi Time) Disposition Final User 07/24/2014 8:34:42 PM Send To Call Back Waiting For Nurse Silvio Clayman 07/24/2014 8:34:48 PM  Send to Urgent Queue Silvio Clayman 07/24/2014 8:46:19 PM Go to ED Now Yes Justine Null, RN, Rodena Piety PLEASE NOTE: All timestamps contained within this report are represented as [place] Standard Time. CONFIDENTIALTY NOTICE: This fax transmission is intended only for the addressee. It contains information that is legally privileged, confidential or otherwise protected from use or disclosure. If you are not the intended recipient, you are strictly prohibited from reviewing, disclosing, copying using or disseminating any of this information or taking any action in reliance on or regarding this information. If you have received this fax in error, please notify us immediately by telephone so that we can arrange for its return to [place]. Phone: [phone], Toll-Free: [phone], Fax: [phone] Page: 2 of 2 Call Id: [id number] Caller Understands: Yes Disagree/Comply: Comply Care Advice Given Per Guideline GO TO ED NOW: You need to be seen in the Emergency Department. Go to the ER at ___________ [facility] now. Drive carefully. NOTE TO TRIAGER - DRIVING: * Another adult should drive. * If immediate transportation is not available via car or taxi, then the patient should be instructed to call [id number]. BRING MEDICINES: * Please bring a list of your current medicines when you go to the Emergency Department (ER). * It is also a good idea to bring the pill bottles too. This will help the doctor to make certain you are taking the right medicines and the right dose. CARE ADVICE given per Asthma Attack (Adult) guideline. After Care Instructions Given Call Event Type User Date / Time Description Referrals [facility] - ED

## 2014-08-05 ENCOUNTER — Telehealth: Payer: Self-pay | Admitting: Internal Medicine

## 2014-08-05 NOTE — Telephone Encounter (Signed)
Noted  

## 2014-08-05 NOTE — Telephone Encounter (Signed)
[facility] Night - Client TELEPHONE [facility] Patient Name: Renee Lam Gender: Female DOB: August 24, 1960 Age: 54 Y 37 M 7 D Return Phone Number: [phone] (Primary) Address: [address] City/State/Zip: [place] [address] Client [facility] Night - Client Client Site [facility] - Night Physician Simonne Martinet Contact Type Call Call Type Triage / Clinical Relationship To Patient Self Return Phone Number [phone] (Primary) Chief Complaint Prescription Refill or Medication Request (non symptomatic) Initial Comment Caller states she left her insulin at home; she is out of town needs 1 pen called in Nurse Assessment Nurse: Markus Daft, Therapist, sports, Sherre Poot Date/Time (Eastern Time): 08/03/2014 10:06:06 AM Please select the assessment type ---Refill Additional Documentation ---Caller states she needs Levemir 43 units QHS (did not have last nights dose), and Novolog 15 - 20 units TID (per sliding scale) Flexpen insulin. She is OOT, and just needs 1 pen called in. She just got a shipment from mail order, but left at home. -- [facility] [address] in [place] # [phone]. -- Fasting blood sugar this AM was 189. Does the patient have enough medication to last until the office opens? ---No Additional Documentation ---She will be OOT for a few more days. RN will contact MD and get order. Caller advised to check pharmacy in one hour for the meds. Might have to pay out of pocket. Caller verb. understanding. Guidelines Guideline Title Affirmed Question Affirmed Notes Nurse Date/Time (Eastern Time) Disp. Time Eilene Ghazi Time) Disposition Final User 08/03/2014 10:13:44 AM Paged On Call back to [facility], [place], [place] 08/03/2014 10:37:49 AM [facility] Provider [place], RN, [facility] 08/03/2014 10:45:44 AM Pharmacy Call Markus Daft, [place], Sherre Poot Reason[facility],  [place], given script for Flexpens on Levemir and Novolog 08/03/2014 10:50:37 AM Clinical Call Yes Markus Daft, RN, Sherre Poot After Care Instructions Given Call Event Type User Date / Time Description PLEASE NOTE: All timestamps contained within this report are represented as [place] Standard Time. CONFIDENTIALTY NOTICE: This fax transmission is intended only for the addressee. It contains information that is legally privileged, confidential or otherwise protected from use or disclosure. If you are not the intended recipient, you are strictly prohibited from reviewing, disclosing, copying using or disseminating any of this information or taking any action in reliance on or regarding this information. If you have received this fax in error, please notify us immediately by telephone so that we can arrange for its return to [place]. Phone: [phone], Toll-Free: [phone], Fax: [phone] Page: 2 of 3 Call Id: [id number] Verbal Orders/Maintenance Medications Medication Refill Route Dosage Regime Duration Admin Instructions User Name Levemir 43 units Yes SubcutaneousQHS QS for one month refill Markus Daft, Therapist, sports, [place] Novolog Flexpen Yes Subcutaneous15-20 units TID QS for one month Markus Daft, [place], [facility] Paging DoctorName DoctorPhone DateTime Result/Outcome Notes Shanon Ace [phone] 08/03/2014 10:13:44 AM Paged On Call Back to [facility] Shanon Ace [phone] 08/03/2014 10:37:49 AM Called On Call Provider - Reached Shanon Ace 08/03/2014 10:40:07 AM Spoke with On Call - General Dr. Regis Bill notified, and gave approval to refill for one month.

## 2014-10-23 ENCOUNTER — Other Ambulatory Visit: Payer: Self-pay | Admitting: Internal Medicine

## 2014-10-25 ENCOUNTER — Telehealth: Payer: Self-pay

## 2014-10-25 NOTE — Telephone Encounter (Signed)
Called and spoke with pt and pt states she will call Dr. Tonette Bihari office and set up her mammogram.

## 2014-12-21 ENCOUNTER — Encounter (HOSPITAL_COMMUNITY): Payer: Self-pay | Admitting: *Deleted

## 2014-12-21 ENCOUNTER — Emergency Department (INDEPENDENT_AMBULATORY_CARE_PROVIDER_SITE_OTHER): Payer: 59

## 2014-12-21 ENCOUNTER — Emergency Department (HOSPITAL_COMMUNITY)
Admission: EM | Admit: 2014-12-21 | Discharge: 2014-12-21 | Disposition: A | Discharge status: Home / Self Care | Payer: 59 | Source: Home / Self Care | Attending: Family Medicine | Admitting: Family Medicine

## 2014-12-21 DIAGNOSIS — M65272 Calcific tendinitis, left ankle and foot: Secondary | ICD-10-CM | POA: Diagnosis not present

## 2014-12-21 NOTE — ED Provider Notes (Signed)
CSN: [id number]     Arrival date & time 12/21/14  1527 History   First MD Initiated Contact with Patient 12/21/14 1558     Chief Complaint  Patient presents with  . Foot Pain   (Consider location/radiation/quality/duration/timing/severity/associated sxs/prior Treatment) Patient is a 54 y.o. female presenting with lower extremity pain. The history is provided by the patient.  Foot Pain This is a new problem. The current episode started yesterday (nki, has worn high heel shoes until past mo. no sports activity.). The problem has not changed since onset.   Past Medical History  Diagnosis Date  . Asthma   . Diabetes mellitus   . Arthritis   . Chicken pox   . GERD (gastroesophageal reflux disease)   . Allergy   . Hypertension   . Hyperlipidemia   . Migraine   . Viral meningitis   . Pericarditis   . Finger infection 2008    isolation   Past Surgical History  Procedure Laterality Date  . Breast surgery  2002    breast bx  . Cesarean section      1997, 2000  . Knee arthroscopy  2010    right  . Cardiac catheterization  2012   Family History  Problem Relation Age of Onset  . Arthritis Other   . Cancer Other     colon,ovary/uterus,breast,prostate  . Hyperlipidemia Other   . Heart disease Other   . Hypertension Other   . Depression Other   . Colon cancer Neg Hx   . Stomach cancer Neg Hx   . Colon polyps Mother   . Diabetes Mother   . Heart failure Mother   . Diabetes Father   . Heart disease Father   . Arthritis Father   . Hypertension Father   . Heart failure Father    History  Substance Use Topics  . Smoking status: Never Smoker   . Smokeless tobacco: Never Used  . Alcohol Use: No     Comment: once a year   OB History    No data available     Review of Systems  Constitutional: Negative.   Musculoskeletal: Positive for gait problem. Negative for joint swelling.  Skin: Negative.     Allergies  Food; Penicillins; and Zithromax  Home Medications    Prior to Admission medications   Medication Sig Start Date End Date Taking? Authorizing Provider  albuterol (PROVENTIL HFA;VENTOLIN HFA) 108 (90 BASE) MCG/ACT inhaler Inhale 2 puffs into the lungs every 4 (four) hours as needed. For shortness of breath/wheezing 02/01/14 05/11/15  Marletta Lor, MD  aspirin 81 MG tablet Take 81 mg by mouth daily.     Historical Provider, MD  Aspirin-Acetaminophen-Caffeine (EXCEDRIN EXTRA STRENGTH PO) Take by mouth as needed.    Historical Provider, MD  atorvastatin (LIPITOR) 80 MG tablet Take 0.5 tablets (40 mg total) by mouth at bedtime. 02/01/14 04/20/15  Marletta Lor, MD  Blood Glucose Monitoring Suppl ([place]) W/DEVICE KIT by Does not apply route.    Historical Provider, MD  Canagliflozin (INVOKANA) 300 MG TABS Take 1 tablet (300 mg total) by mouth daily. 02/01/14   Marletta Lor, MD  cefUROXime (CEFTIN) 250 MG tablet Take 1 tablet (250 mg total) by mouth 2 (two) times daily with a meal. 06/24/14   Janne Napoleon, NP  diphenhydrAMINE (BENADRYL) 25 MG tablet Take 25 mg by mouth every 6 (six) hours as needed.    Historical Provider, MD  EPINEPHrine (EPI-PEN) 0.3 mg/0.3 mL DEVI  Inject 0.3 mg into the muscle daily as needed. For anaphylaxis    Historical Provider, MD  glucose blood (FREESTYLE TEST STRIPS) test strip Test 4 times daily. 02/01/14   Marletta Lor, MD  insulin aspart (NOVOLOG FLEXPEN) 100 UNIT/ML FlexPen 30 units prior to each meal. If blood sugar is greater than 160 add an additional 10 units prior to each meal Patient taking differently: 15 Units. 30 units prior to each meal. If blood sugar is greater than 160 add an additional 10 units prior to each meal 02/08/14   Marletta Lor, MD  Insulin Detemir (LEVEMIR) 100 UNIT/ML Pen Inject 80 Units into the skin at bedtime. Patient taking differently: Inject 45 Units into the skin at bedtime.  02/01/14   Marletta Lor, MD  Insulin Pen Needle 32G X 4 MM MISC Test  4 times daily. 02/01/14   Marletta Lor, MD  INVOKANA 300 MG TABS tablet Take 1 tablet by mouth  daily 10/23/14   Marletta Lor, MD  ketorolac (TORADOL) 10 MG tablet Take 2 tablets first dose, then 1 q 8h prn pain 06/24/14   Janne Napoleon, NP  metformin (FORTAMET) 500 MG (OSM) 24 hr tablet Take 1 tablet (500 mg total) by mouth daily with breakfast. 06/21/13   Marletta Lor, MD  naproxen sodium (ANAPROX) 220 MG tablet Take 220 mg by mouth as needed.    Historical Provider, MD  ondansetron (ZOFRAN) 4 MG tablet Take 1 tablet (4 mg total) by mouth every 6 (six) hours. 06/24/14   Janne Napoleon, NP  phenylephrine (SUDAFED PE) 10 MG TABS tablet Take 10 mg by mouth every 6 (six) hours as needed.    Historical Provider, MD   BP 125/84 mmHg  Pulse 85  Temp(Src) 98.5 F (36.9 C) (Oral)  Resp 16  SpO2 97% Physical Exam  Constitutional: She is oriented to person, place, and time. She appears well-developed and well-nourished. No distress.  Musculoskeletal: Normal range of motion. She exhibits tenderness.       Feet:  Neurological: She is alert and oriented to person, place, and time.  Skin: Skin is warm and dry.  Nursing note and vitals reviewed.   ED Course  Procedures (including critical care time) Labs Review Labs Reviewed - No data to display  Imaging Review Dg Foot Complete Left  12/21/2014   CLINICAL DATA:  Left foot pain for 1 day. No known injury. Initial encounter.  EXAM: LEFT FOOT - COMPLETE 3+ VIEW  COMPARISON:  None.  FINDINGS: There is no evidence of fracture or dislocation. There is no evidence of arthropathy or other focal bone abnormality. Soft tissues are unremarkable.  IMPRESSION: Negative.   Electronically Signed   By: Margarette Canada M.D.   On: 12/21/2014 17:20     MDM   1. Calcific tendonitis of left foot        Billy Fischer, MD 12/23/14 1300

## 2014-12-21 NOTE — ED Notes (Signed)
Pt  Reports  Pain  l  /  Foot  /  Ankle   And  Heel           Symptoms  Began yesterday     =   Pt  denys  Any  Injury

## 2014-12-21 NOTE — Discharge Instructions (Signed)
Ice and medicine and wrap for pain as needed, activity as tolerated. Return if needed.

## 2015-02-01 ENCOUNTER — Emergency Department (HOSPITAL_COMMUNITY)
Admission: EM | Admit: 2015-02-01 | Discharge: 2015-02-01 | Disposition: A | Discharge status: Home / Self Care | Payer: 59 | Attending: Emergency Medicine | Admitting: Emergency Medicine

## 2015-02-01 ENCOUNTER — Encounter (HOSPITAL_COMMUNITY): Payer: Self-pay | Admitting: *Deleted

## 2015-02-01 DIAGNOSIS — M791 Myalgia: Secondary | ICD-10-CM | POA: Insufficient documentation

## 2015-02-01 DIAGNOSIS — M1990 Unspecified osteoarthritis, unspecified site: Secondary | ICD-10-CM | POA: Diagnosis not present

## 2015-02-01 DIAGNOSIS — K219 Gastro-esophageal reflux disease without esophagitis: Secondary | ICD-10-CM | POA: Insufficient documentation

## 2015-02-01 DIAGNOSIS — I10 Essential (primary) hypertension: Secondary | ICD-10-CM | POA: Insufficient documentation

## 2015-02-01 DIAGNOSIS — Z8619 Personal history of other infectious and parasitic diseases: Secondary | ICD-10-CM | POA: Insufficient documentation

## 2015-02-01 DIAGNOSIS — E119 Type 2 diabetes mellitus without complications: Secondary | ICD-10-CM | POA: Diagnosis not present

## 2015-02-01 DIAGNOSIS — M79672 Pain in left foot: Secondary | ICD-10-CM | POA: Insufficient documentation

## 2015-02-01 DIAGNOSIS — Z79899 Other long term (current) drug therapy: Secondary | ICD-10-CM | POA: Insufficient documentation

## 2015-02-01 DIAGNOSIS — Z7982 Long term (current) use of aspirin: Secondary | ICD-10-CM | POA: Diagnosis not present

## 2015-02-01 DIAGNOSIS — E785 Hyperlipidemia, unspecified: Secondary | ICD-10-CM | POA: Insufficient documentation

## 2015-02-01 DIAGNOSIS — J45909 Unspecified asthma, uncomplicated: Secondary | ICD-10-CM | POA: Insufficient documentation

## 2015-02-01 DIAGNOSIS — Z794 Long term (current) use of insulin: Secondary | ICD-10-CM | POA: Insufficient documentation

## 2015-02-01 DIAGNOSIS — G43909 Migraine, unspecified, not intractable, without status migrainosus: Secondary | ICD-10-CM | POA: Diagnosis not present

## 2015-02-01 DIAGNOSIS — Z880 Allergy status to penicillin: Secondary | ICD-10-CM | POA: Insufficient documentation

## 2015-02-01 MED ORDER — LIDOCAINE 5 % EX PTCH: 1.0000 | MEDICATED_PATCH | CUTANEOUS | Status: DC

## 2015-02-01 NOTE — Discharge Instructions (Signed)

## 2015-02-01 NOTE — ED Provider Notes (Signed)
CSN: [id number]     Arrival date & time 02/01/15  1544 History  This chart was scribed for non-physician practitioner, Abigail Harris, PA-C working with Marcy Pfeiffer, MD, by Lam Ferguson, ED Scribe. This patient was seen in room TR05C/TR05C and the patient's care was started at 4:20 PM.    Chief Complaint  Patient presents with  . Foot Pain    The history is provided by the patient. No language interpreter was used.    HPI Comments: Renee Lam is a 54 y.o. female with a h/o DM who presents to the Emergency Department complaining of constant, moderate, left foot pain onset 4 weeks ago. She states the pain is radiating up into her shin and she has associated numbness. She notes the worst area of pain is along the lateral aspect of her left foot. Pt reports she was diagnosed with tendonitis at an urgent care 4 weeks ago. She was given a shoe to help improve her symptoms and she reports initial relief but states the pain has returned and has become worse. Pt reports ambulation, walking up steps and driving exacerbates the pain. She states the in the past elevation has provided relief for the pain but lately has not been. She was prescribed 600mg Motrin, 3x daily which she has been compliant with. She notes she was given pain medication as well but states she does not like to take pain meds. Pt reports the pain has been keeping her up at night. She denies any redness, swelling, sensation loss, gait problem, or weakness.  PCP: KWIATKOWSKI,PETER FRANK, MD  Past Medical History  Diagnosis Date  . Asthma   . Diabetes mellitus   . Arthritis   . Chicken pox   . GERD (gastroesophageal reflux disease)   . Allergy   . Hypertension   . Hyperlipidemia   . Migraine   . Viral meningitis   . Pericarditis   . Finger infection 2008    isolation   Past Surgical History  Procedure Laterality Date  . Breast surgery  2002    breast bx  . Cesarean section      1997, 2000  . Knee  arthroscopy  2010    right  . Cardiac catheterization  2012   Family History  Problem Relation Age of Onset  . Arthritis Other   . Cancer Other     colon,ovary/uterus,breast,prostate  . Hyperlipidemia Other   . Heart disease Other   . Hypertension Other   . Depression Other   . Colon cancer Neg Hx   . Stomach cancer Neg Hx   . Colon polyps Mother   . Diabetes Mother   . Heart failure Mother   . Diabetes Father   . Heart disease Father   . Arthritis Father   . Hypertension Father   . Heart failure Father    Social History  Substance Use Topics  . Smoking status: Never Smoker   . Smokeless tobacco: Never Used  . Alcohol Use: No     Comment: once a year   OB History    No data available     Review of Systems  Musculoskeletal: Positive for myalgias and arthralgias. Negative for joint swelling and gait problem.  Skin: Negative for color change.  Neurological: Positive for numbness. Negative for weakness.       Negative for sensation loss      Allergies  Food; Penicillins; and Zithromax  Home Medications   Prior to Admission medications     Medication Sig Start Date End Date Taking? Authorizing Provider  albuterol (PROVENTIL HFA;VENTOLIN HFA) 108 (90 BASE) MCG/ACT inhaler Inhale 2 puffs into the lungs every 4 (four) hours as needed. For shortness of breath/wheezing 02/01/14 05/11/15  Marletta Lor, MD  aspirin 81 MG tablet Take 81 mg by mouth daily.     Historical Provider, MD  Aspirin-Acetaminophen-Caffeine (EXCEDRIN EXTRA STRENGTH PO) Take by mouth as needed.    Historical Provider, MD  atorvastatin (LIPITOR) 80 MG tablet Take 0.5 tablets (40 mg total) by mouth at bedtime. 02/01/14 04/20/15  Marletta Lor, MD  Blood Glucose Monitoring Suppl ([place]) W/DEVICE KIT by Does not apply route.    Historical Provider, MD  Canagliflozin (INVOKANA) 300 MG TABS Take 1 tablet (300 mg total) by mouth daily. 02/01/14   Marletta Lor, MD   cefUROXime (CEFTIN) 250 MG tablet Take 1 tablet (250 mg total) by mouth 2 (two) times daily with a meal. 06/24/14   Janne Napoleon, NP  diphenhydrAMINE (BENADRYL) 25 MG tablet Take 25 mg by mouth every 6 (six) hours as needed.    Historical Provider, MD  EPINEPHrine (EPI-PEN) 0.3 mg/0.3 mL DEVI Inject 0.3 mg into the muscle daily as needed. For anaphylaxis    Historical Provider, MD  glucose blood (FREESTYLE TEST STRIPS) test strip Test 4 times daily. 02/01/14   Marletta Lor, MD  insulin aspart (NOVOLOG FLEXPEN) 100 UNIT/ML FlexPen 30 units prior to each meal. If blood sugar is greater than 160 add an additional 10 units prior to each meal Patient taking differently: 15 Units. 30 units prior to each meal. If blood sugar is greater than 160 add an additional 10 units prior to each meal 02/08/14   Marletta Lor, MD  Insulin Detemir (LEVEMIR) 100 UNIT/ML Pen Inject 80 Units into the skin at bedtime. Patient taking differently: Inject 45 Units into the skin at bedtime.  02/01/14   Marletta Lor, MD  Insulin Pen Needle 32G X 4 MM MISC Test 4 times daily. 02/01/14   Marletta Lor, MD  INVOKANA 300 MG TABS tablet Take 1 tablet by mouth  daily 10/23/14   Marletta Lor, MD  ketorolac (TORADOL) 10 MG tablet Take 2 tablets first dose, then 1 q 8h prn pain 06/24/14   Janne Napoleon, NP  lidocaine (LIDODERM) 5 % Place 1 patch onto the skin daily. Remove & Discard patch within 12 hours or as directed by MD 02/01/15   Margarita Mail, PA-C  metformin (FORTAMET) 500 MG (OSM) 24 hr tablet Take 1 tablet (500 mg total) by mouth daily with breakfast. 06/21/13   Marletta Lor, MD  naproxen sodium (ANAPROX) 220 MG tablet Take 220 mg by mouth as needed.    Historical Provider, MD  ondansetron (ZOFRAN) 4 MG tablet Take 1 tablet (4 mg total) by mouth every 6 (six) hours. 06/24/14   Janne Napoleon, NP  phenylephrine (SUDAFED PE) 10 MG TABS tablet Take 10 mg by mouth every 6 (six) hours as needed.    Historical  Provider, MD   Triage Vitals: BP 122/65 mmHg  Pulse 90  Temp(Src) 98.2 F (36.8 C) (Oral)  Resp 16  Ht 4' 10" (1.473 m)  Wt 169 lb (76.658 kg)  BMI 35.33 kg/m2  SpO2 96%  Physical Exam  Constitutional: She is oriented to person, place, and time. She appears well-developed and well-nourished. No distress.  HENT:  Head: Normocephalic and atraumatic.  Eyes: Conjunctivae and EOM are normal.  Neck:  Neck supple. No tracheal deviation present.  Cardiovascular: Normal rate, regular rhythm, normal heart sounds and intact distal pulses.   Pulmonary/Chest: Effort normal and breath sounds normal. No respiratory distress.  Musculoskeletal: Normal range of motion. She exhibits tenderness.  Tender to palpation along dorsum of left room. Full strength and ROM. Calf soft and non tender  Neurological: She is alert and oriented to person, place, and time.  Sensation intact.  Skin: Skin is warm and dry.  Psychiatric: She has a normal mood and affect. Her behavior is normal.  Nursing note and vitals reviewed.   ED Course  Procedures (including critical care time)  DIAGNOSTIC STUDIES: Oxygen Saturation is 96% on RA, normal by my interpretation.    COORDINATION OF CARE:    Labs Review Labs Reviewed - No data to display  Imaging Review No results found.   I have personally reviewed and evaluated these images and lab results as part of my medical decision-making.   EKG Interpretation None      MDM   Final diagnoses:  Left foot pain    Patient with foot pain.  Given post op shoe.  D.c with NSAIDS , lidoderm. F/u with podiatry I personally performed the services described in this documentation, which was scribed in my presence. The recorded information has been reviewed and is accurate.       Margarita Mail, PA-C 02/02/15 1145  Charlesetta Shanks, MD 02/06/15 2208

## 2015-02-01 NOTE — ED Notes (Signed)
Pt states was dx with tendonitis 4 weeks ago.  States symptoms improved with shoe she was given, but now symptoms have returned and they are worse.

## 2015-02-20 ENCOUNTER — Ambulatory Visit: Payer: 59 | Admitting: Podiatry

## 2015-03-06 ENCOUNTER — Ambulatory Visit: Payer: 59 | Admitting: Internal Medicine

## 2015-03-10 ENCOUNTER — Ambulatory Visit (INDEPENDENT_AMBULATORY_CARE_PROVIDER_SITE_OTHER): Payer: 59 | Admitting: Internal Medicine

## 2015-03-10 ENCOUNTER — Telehealth: Payer: Self-pay | Admitting: Internal Medicine

## 2015-03-10 ENCOUNTER — Encounter: Payer: Self-pay | Admitting: Internal Medicine

## 2015-03-10 VITALS — BP 130/90 | HR 102 | Temp 98.5°F | Resp 20 | Ht <= 58 in | Wt 166.0 lb

## 2015-03-10 DIAGNOSIS — Z23 Encounter for immunization: Secondary | ICD-10-CM

## 2015-03-10 DIAGNOSIS — E7800 Pure hypercholesterolemia, unspecified: Secondary | ICD-10-CM

## 2015-03-10 DIAGNOSIS — E119 Type 2 diabetes mellitus without complications: Secondary | ICD-10-CM | POA: Diagnosis not present

## 2015-03-10 LAB — LIPID PANEL
Cholesterol: 242 mg/dL — AB (ref 0–200)
HDL: 54 mg/dL (ref 35–70)
LDL Cholesterol: 136 mg/dL
LDl/HDL Ratio: 4.5
Triglycerides: 261 mg/dL — AB (ref 40–160)

## 2015-03-10 LAB — BASIC METABOLIC PANEL
Creatinine: 0.6 mg/dL (ref 0.5–1.1)
Glucose: 122 mg/dL

## 2015-03-10 LAB — HEMOGLOBIN A1C: Hgb A1c MFr Bld: 9.5 % — AB (ref 4.0–6.0)

## 2015-03-10 MED ORDER — ATORVASTATIN CALCIUM 80 MG PO TABS: 80.0000 mg | ORAL_TABLET | Freq: Every day | ORAL | Status: DC

## 2015-03-10 MED ORDER — INSULIN DETEMIR 100 UNIT/ML FLEXPEN: 90.0000 [IU] | PEN_INJECTOR | Freq: Every day | SUBCUTANEOUS | Status: DC

## 2015-03-10 NOTE — Telephone Encounter (Signed)
That is fine 

## 2015-03-10 NOTE — Progress Notes (Signed)
Subjective:    Patient ID: Renee Lam, female    DOB: August 05, 1960, 54 y.o.   MRN: [id number]  HPI  Lab Results  Component Value Date   HGBA1C 13.1* 01/15/2014   Wt Readings from Last 3 Encounters:  03/10/15 166 lb (75.297 kg)  02/01/15 169 lb (76.658 kg)  05/16/14 164 lb 14.4 oz (74.61 kg)   54 year old patient who is seen today for follow-up of diabetes.  She has not been seen here in over one year.  Hemoglobin A1c was 13 point 1.  At that time.  She recently had a health screen with a hemoglobin A1c of 9.5.  She has been referred for diabetic teaching and nutrition counseling and presently is being followed at a bariatric clinic.  Recent laboratory studies have included a cholesterol of 242.  Creatinine 0.6 She has been on Invokanna in the past, but this has been self discontinued due to fear of side effects.  Today she does not recall what side effects she was fearful of.  She is on low intensity metformin and has not been able to tolerate higher dosages She is on basal insulin 80 units with fasting blood sugars generally in the 160-170 range.  Low fasting blood sugar approximately 1:30 She checks blood sugars usually twice daily fasting and before lunch or dinner.  She has had one episode of symptomatic but unconfirmed hypoglycemia prior to lunch when she failed to have a midmorning snack.  She states blood sugars prior to her evening meal.  Are generally in the 120 range  No recent eye exam, but one has been scheduled.  She is also scheduled for a mammogram and gynecologic visit next month.  She states her job now requires considerable travel and is having a difficult time with her meals and diet  Past Medical History  Diagnosis Date  . Asthma   . Diabetes mellitus   . Arthritis   . Chicken pox   . GERD (gastroesophageal reflux disease)   . Allergy   . Hypertension   . Hyperlipidemia   . Migraine   . Viral meningitis   . Pericarditis   . Finger infection 2008    isolation    Social History   Social History  . Marital Status: Divorced    Spouse Name: N/A  . Number of Children: N/A  . Years of Education: N/A   Occupational History  . Not on file.   Social History Main Topics  . Smoking status: Never Smoker   . Smokeless tobacco: Never Used  . Alcohol Use: No     Comment: once a year  . Drug Use: No  . Sexual Activity: Not on file   Other Topics Concern  . Not on file   Social History Narrative    Past Surgical History  Procedure Laterality Date  . Breast surgery  2002    breast bx  . Cesarean section      1997, 2000  . Knee arthroscopy  2010    right  . Cardiac catheterization  2012    Family History  Problem Relation Age of Onset  . Arthritis Other   . Cancer Other     colon,ovary/uterus,breast,prostate  . Hyperlipidemia Other   . Heart disease Other   . Hypertension Other   . Depression Other   . Colon cancer Neg Hx   . Stomach cancer Neg Hx   . Colon polyps Mother   . Diabetes Mother   . Heart  failure Mother   . Diabetes Father   . Heart disease Father   . Arthritis Father   . Hypertension Father   . Heart failure Father     Allergies  Allergen Reactions  . Food     Strawberries, mangoes, pineapple, eggplant, zucchini  . Penicillins Hives  . Zithromax [Azithromycin] Hives    z-max; states can take Z pack    Current Outpatient Prescriptions on File Prior to Visit  Medication Sig Dispense Refill  . albuterol (PROVENTIL HFA;VENTOLIN HFA) 108 (90 BASE) MCG/ACT inhaler Inhale 2 puffs into the lungs every 4 (four) hours as needed. For shortness of breath/wheezing 1 Inhaler 5  . aspirin 81 MG tablet Take 81 mg by mouth daily.     . Aspirin-Acetaminophen-Caffeine (EXCEDRIN EXTRA STRENGTH PO) Take by mouth as needed.    Marland Kitchen atorvastatin (LIPITOR) 80 MG tablet Take 0.5 tablets (40 mg total) by mouth at bedtime. 90 tablet 4  . Blood Glucose Monitoring Suppl (FREESTYLE INSULINX SYSTEM) W/DEVICE KIT by Does not  apply route.    . diphenhydrAMINE (BENADRYL) 25 MG tablet Take 25 mg by mouth every 6 (six) hours as needed.    Marland Kitchen EPINEPHrine (EPI-PEN) 0.3 mg/0.3 mL DEVI Inject 0.3 mg into the muscle daily as needed. For anaphylaxis    . glucose blood (FREESTYLE TEST STRIPS) test strip Test 4 times daily. 400 each 5  . insulin aspart (NOVOLOG FLEXPEN) 100 UNIT/ML FlexPen 30 units prior to each meal. If blood sugar is greater than 160 add an additional 10 units prior to each meal (Patient taking differently: 15 Units. 30 units prior to each meal. If blood sugar is greater than 160 add an additional 10 units prior to each meal) 15 pen 3  . Insulin Detemir (LEVEMIR) 100 UNIT/ML Pen Inject 80 Units into the skin at bedtime. (Patient taking differently: Inject 45 Units into the skin at bedtime. ) 45 mL 5  . Insulin Pen Needle 32G X 4 MM MISC Test 4 times daily. 400 each 5  . metformin (FORTAMET) 500 MG (OSM) 24 hr tablet Take 1 tablet (500 mg total) by mouth daily with breakfast. 90 tablet 3  . naproxen sodium (ANAPROX) 220 MG tablet Take 220 mg by mouth as needed.    . Canagliflozin (INVOKANA) 300 MG TABS Take 1 tablet (300 mg total) by mouth daily. (Patient not taking: Reported on 03/10/2015) 30 tablet 6   No current facility-administered medications on file prior to visit.    BP 130/90 mmHg  Pulse 102  Temp(Src) 98.5 F (36.9 C) (Oral)  Resp 20  Ht _0  (1.473 m)  Wt 166 lb (75.297 kg)  BMI 34.70 kg/m2  SpO2 98%     Review of Systems  Constitutional: Negative.   HENT: Negative for congestion, dental problem, hearing loss, rhinorrhea, sinus pressure, sore throat and tinnitus.   Eyes: Negative for pain, discharge and visual disturbance.  Respiratory: Negative for cough and shortness of breath.   Cardiovascular: Negative for chest pain, palpitations and leg swelling.  Gastrointestinal: Negative for nausea, vomiting, abdominal pain, diarrhea, constipation, blood in stool and abdominal distention.    Genitourinary: Negative for dysuria, urgency, frequency, hematuria, flank pain, vaginal bleeding, vaginal discharge, difficulty urinating, vaginal pain and pelvic pain.  Musculoskeletal: Negative for joint swelling, arthralgias and gait problem.  Skin: Negative for rash.  Neurological: Negative for dizziness, syncope, speech difficulty, weakness, numbness and headaches.  Hematological: Negative for adenopathy.  Psychiatric/Behavioral: Negative for behavioral problems, dysphoric mood and agitation.  The patient is not nervous/anxious.        Objective:   Physical Exam  Constitutional: She is oriented to person, place, and time. She appears well-developed and well-nourished.  Repeat blood pressure 110/76 Pulse still elevated 100  HENT:  Head: Normocephalic.  Right Ear: External ear normal.  Left Ear: External ear normal.  Mouth/Throat: Oropharynx is clear and moist.  Eyes: Conjunctivae and EOM are normal. Pupils are equal, round, and reactive to light.  Neck: Normal range of motion. Neck supple. No thyromegaly present.  Cardiovascular: Normal rate, regular rhythm, normal heart sounds and intact distal pulses.   Pulmonary/Chest: Effort normal and breath sounds normal.  Abdominal: Soft. Bowel sounds are normal. She exhibits no mass. There is no tenderness.  Musculoskeletal: Normal range of motion.  Lymphadenopathy:    She has no cervical adenopathy.  Neurological: She is alert and oriented to person, place, and time.  Skin: Skin is warm and dry. No rash noted.  Psychiatric: She has a normal mood and affect. Her behavior is normal.  Vitals reviewed.         Assessment & Plan:   Diabetes.  Poor control with recent hemoglobin A1c 9.5.  We'll increase basal insulin from 80 to 90 units.  Endocrinology referral.  Encouraged.  Resume Invokanna Dyslipidemia.  Will increase atorvastatin to 80 units daily History of asthma

## 2015-03-10 NOTE — Progress Notes (Signed)
Pre visit review using our clinic review tool, if applicable. No additional management support is needed unless otherwise documented below in the visit note. 

## 2015-03-10 NOTE — Telephone Encounter (Signed)
I misunderstood the pt, Pt states she works at [employer] and needs a future order for the a1c. Pt will have it done prior to appt in Dec

## 2015-03-10 NOTE — Telephone Encounter (Signed)
Pt wants to come in prior ro her 3 mo fup in dec for an A1C. Is that ok?

## 2015-03-10 NOTE — Patient Instructions (Addendum)
Resume Invokanna Increase Levemir to 90 units at bedtime Continue present mealtime insulin  Endocrinology referral    It is important that you exercise regularly, at least 20 minutes 3 to 4 times per week.  If you develop chest pain or shortness of breath seek  medical attention.   Please check your hemoglobin A1c every 3 months  Limit your sodium (Salt) intake  Please check your blood pressure on a regular basis.  If it is consistently greater than 150/90, please make an office appointment.  Increase atorvastatin to 80 mg daily  Please see your eye doctor yearly to check for diabetic eye damage

## 2015-03-12 NOTE — Telephone Encounter (Signed)
Left message on voicemail will mail lab order to home address. Any questions please call office.

## 2015-03-15 ENCOUNTER — Encounter: Payer: Self-pay | Admitting: Internal Medicine

## 2015-03-17 ENCOUNTER — Ambulatory Visit (INDEPENDENT_AMBULATORY_CARE_PROVIDER_SITE_OTHER): Payer: 59 | Admitting: Endocrinology

## 2015-03-17 ENCOUNTER — Encounter: Payer: Self-pay | Admitting: Endocrinology

## 2015-03-17 VITALS — BP 134/84 | HR 97 | Temp 98.7°F | Ht <= 58 in | Wt 166.0 lb

## 2015-03-17 DIAGNOSIS — E109 Type 1 diabetes mellitus without complications: Secondary | ICD-10-CM

## 2015-03-17 MED ORDER — INSULIN ASPART 100 UNIT/ML FLEXPEN: PEN_INJECTOR | SUBCUTANEOUS | Status: DC

## 2015-03-17 NOTE — Patient Instructions (Addendum)
good diet and exercise significantly improve the control of your diabetes.  please let me know if you wish to be referred to a dietician, or for weight loss surgery.  high blood sugar is very risky to your health.  you should see an eye doctor and dentist every year.  It is very important to get all recommended vaccinations.  controlling your blood pressure and cholesterol drastically reduces the damage diabetes does to your body.  Those who smoke should quit.  please discuss these with your doctor.   check your blood sugar twice a day.  vary the time of day when you check, between before the 3 meals, and at bedtime.  also check if you have symptoms of your blood sugar being too high or too low.  please keep a record of the readings and bring it to your next appointment here.  You can write it on any piece of paper.  please call us sooner if your blood sugar goes below 70, or if you have a lot of readings over 200.   Please consider having weight loss surgery.  It is good for your health.  Here is some information about it.  If you decide to consider further, please call the phone number in the papers, and register for a free informational meeting.   For now: It is ok with me to stay on the diabetes pills, and: Please continue the same levemir, and: Change the novolog to 3 times a day (just before each meal) 04-13-39 units.   Please come back for a follow-up appointment in 1 month.  Please call next week, to tell us how the blood sugar is doing.

## 2015-03-17 NOTE — Progress Notes (Signed)
Subjective:    Patient ID: Renee Lam, female    DOB: 04-29-1961, 54 y.o.   MRN: [id number]  HPI pt states DM was dx'ed in 2000 (she had GDM in 1996 and 1999); she has mild if any neuropathy of the lower extremities; she is unaware of any associated chronic complications; he has been on insulin since 2010; pt says her diet and exercise are both poor; she has never had pancreatitis, severe hypoglycemia or DKA.  She says cbg's are frequently low at lunch.  Otherwise, it varies from 160-180.  Past Medical History  Diagnosis Date  . Asthma   . Diabetes mellitus   . Arthritis   . Chicken pox   . GERD (gastroesophageal reflux disease)   . Allergy   . Hypertension   . Hyperlipidemia   . Migraine   . Viral meningitis   . Pericarditis   . Finger infection 2008    isolation    Past Surgical History  Procedure Laterality Date  . Breast surgery  2002    breast bx  . Cesarean section      1997, 2000  . Knee arthroscopy  2010    right  . Cardiac catheterization  2012    Social History   Social History  . Marital Status: Divorced    Spouse Name: N/A  . Number of Children: N/A  . Years of Education: N/A   Occupational History  . Not on file.   Social History Main Topics  . Smoking status: Never Smoker   . Smokeless tobacco: Never Used  . Alcohol Use: No     Comment: once a year  . Drug Use: No  . Sexual Activity: Not on file   Other Topics Concern  . Not on file   Social History Narrative    Current Outpatient Prescriptions on File Prior to Visit  Medication Sig Dispense Refill  . albuterol (PROVENTIL HFA;VENTOLIN HFA) 108 (90 BASE) MCG/ACT inhaler Inhale 2 puffs into the lungs every 4 (four) hours as needed. For shortness of breath/wheezing 1 Inhaler 5  . aspirin 81 MG tablet Take 81 mg by mouth daily.     . Aspirin-Acetaminophen-Caffeine (EXCEDRIN EXTRA STRENGTH PO) Take by mouth as needed.    Marland Kitchen atorvastatin (LIPITOR) 80 MG tablet Take 1 tablet (80 mg  total) by mouth at bedtime. 90 tablet 4  . Blood Glucose Monitoring Suppl (FREESTYLE INSULINX SYSTEM) W/DEVICE KIT by Does not apply route.    . Canagliflozin (INVOKANA) 300 MG TABS Take 1 tablet (300 mg total) by mouth daily. 30 tablet 6  . diphenhydrAMINE (BENADRYL) 25 MG tablet Take 25 mg by mouth every 6 (six) hours as needed.    Marland Kitchen EPINEPHrine (EPI-PEN) 0.3 mg/0.3 mL DEVI Inject 0.3 mg into the muscle daily as needed. For anaphylaxis    . glucose blood (FREESTYLE TEST STRIPS) test strip Test 4 times daily. 400 each 5  . Insulin Detemir (LEVEMIR) 100 UNIT/ML Pen Inject 90 Units into the skin at bedtime. 45 mL 5  . Insulin Pen Needle 32G X 4 MM MISC Test 4 times daily. 400 each 5  . metformin (FORTAMET) 500 MG (OSM) 24 hr tablet Take 1 tablet (500 mg total) by mouth daily with breakfast. 90 tablet 3  . naproxen sodium (ANAPROX) 220 MG tablet Take 220 mg by mouth as needed.     No current facility-administered medications on file prior to visit.    Allergies  Allergen Reactions  . Food  Strawberries, mangoes, pineapple, eggplant, zucchini  . Penicillins Hives  . Zithromax [Azithromycin] Hives    z-max; states can take Z pack    Family History  Problem Relation Age of Onset  . Arthritis Other   . Cancer Other     colon,ovary/uterus,breast,prostate  . Hyperlipidemia Other   . Heart disease Other   . Hypertension Other   . Depression Other   . Colon cancer Neg Hx   . Stomach cancer Neg Hx   . Colon polyps Mother   . Diabetes Mother   . Heart failure Mother   . Diabetes Father   . Heart disease Father   . Arthritis Father   . Hypertension Father   . Heart failure Father     BP 134/84 mmHg  Pulse 97  Temp(Src) 98.7 F (37.1 C) (Oral)  Ht 4' 10" (1.473 m)  Wt 166 lb (75.297 kg)  BMI 34.70 kg/m2  SpO2 96%  Review of Systems denies weight loss, blurry vision, headache, chest pain, sob, n/v, urinary frequency, muscle cramps, excessive diaphoresis, depression, cold  intolerance, rhinorrhea, and easy bruising.      Objective:   Physical Exam VS: see vs page GEN: no distress HEAD: head: no deformity eyes: no periorbital swelling, no proptosis external nose and ears are normal mouth: no lesion seen NECK: supple, thyroid is not enlarged CHEST WALL: no deformity LUNGS:  Clear to auscultation CV: reg rate and rhythm, no murmur ABD: abdomen is soft, nontender.  no hepatosplenomegaly.  not distended.  no hernia MUSCULOSKELETAL: muscle bulk and strength are grossly normal.  no obvious joint swelling.  gait is normal and steady EXTEMITIES: no deformity.  no ulcer on the feet.  feet are of normal color and temp.  no edema PULSES: dorsalis pedis intact bilat.  no carotid bruit NEURO:  cn 2-12 grossly intact.   readily moves all 4's.  sensation is intact to touch on the feet SKIN:  Normal texture and temperature.  No rash or suspicious lesion is visible.   NODES:  None palpable at the neck PSYCH: alert, well-oriented.  Does not appear anxious nor depressed.   Lab Results  Component Value Date   HGBA1C 9.5* 03/10/2015   i personally reviewed electrocardiogram tracing (07/24/14): Indication: DM Impression: normal    Assessment & Plan:  DM: new to me: Based on the pattern of her cbg's, she needs some adjustment in her insulin.   Obesity: this complicates the rx of DM.    Patient is advised the following: Patient Instructions  good diet and exercise significantly improve the control of your diabetes.  please let me know if you wish to be referred to a dietician, or for weight loss surgery.  high blood sugar is very risky to your health.  you should see an eye doctor and dentist every year.  It is very important to get all recommended vaccinations.  controlling your blood pressure and cholesterol drastically reduces the damage diabetes does to your body.  Those who smoke should quit.  please discuss these with your doctor.   check your blood sugar twice a  day.  vary the time of day when you check, between before the 3 meals, and at bedtime.  also check if you have symptoms of your blood sugar being too high or too low.  please keep a record of the readings and bring it to your next appointment here.  You can write it on any piece of paper.  please call us sooner  if your blood sugar goes below 70, or if you have a lot of readings over 200.   Please consider having weight loss surgery.  It is good for your health.  Here is some information about it.  If you decide to consider further, please call the phone number in the papers, and register for a free informational meeting.   For now: It is ok with me to stay on the diabetes pills, and: Please continue the same levemir, and: Change the novolog to 3 times a day (just before each meal) 04-13-39 units.   Please come back for a follow-up appointment in 1 month.  Please call next week, to tell us how the blood sugar is doing.     

## 2015-03-20 ENCOUNTER — Encounter (HOSPITAL_COMMUNITY): Payer: Self-pay | Admitting: Emergency Medicine

## 2015-03-20 ENCOUNTER — Emergency Department (HOSPITAL_COMMUNITY): Payer: 59

## 2015-03-20 ENCOUNTER — Emergency Department (HOSPITAL_COMMUNITY)
Admission: EM | Admit: 2015-03-20 | Discharge: 2015-03-20 | Disposition: A | Discharge status: Home / Self Care | Payer: 59 | Attending: Emergency Medicine | Admitting: Emergency Medicine

## 2015-03-20 DIAGNOSIS — Z8719 Personal history of other diseases of the digestive system: Secondary | ICD-10-CM | POA: Diagnosis not present

## 2015-03-20 DIAGNOSIS — Z872 Personal history of diseases of the skin and subcutaneous tissue: Secondary | ICD-10-CM | POA: Diagnosis not present

## 2015-03-20 DIAGNOSIS — Z9889 Other specified postprocedural states: Secondary | ICD-10-CM | POA: Diagnosis not present

## 2015-03-20 DIAGNOSIS — S8991XA Unspecified injury of right lower leg, initial encounter: Secondary | ICD-10-CM | POA: Diagnosis not present

## 2015-03-20 DIAGNOSIS — J45909 Unspecified asthma, uncomplicated: Secondary | ICD-10-CM | POA: Diagnosis not present

## 2015-03-20 DIAGNOSIS — M1990 Unspecified osteoarthritis, unspecified site: Secondary | ICD-10-CM | POA: Diagnosis not present

## 2015-03-20 DIAGNOSIS — Z8619 Personal history of other infectious and parasitic diseases: Secondary | ICD-10-CM | POA: Diagnosis not present

## 2015-03-20 DIAGNOSIS — Z79899 Other long term (current) drug therapy: Secondary | ICD-10-CM | POA: Insufficient documentation

## 2015-03-20 DIAGNOSIS — M25561 Pain in right knee: Secondary | ICD-10-CM

## 2015-03-20 DIAGNOSIS — I10 Essential (primary) hypertension: Secondary | ICD-10-CM | POA: Insufficient documentation

## 2015-03-20 DIAGNOSIS — Y92410 Unspecified street and highway as the place of occurrence of the external cause: Secondary | ICD-10-CM | POA: Insufficient documentation

## 2015-03-20 DIAGNOSIS — Z794 Long term (current) use of insulin: Secondary | ICD-10-CM | POA: Diagnosis not present

## 2015-03-20 DIAGNOSIS — Z880 Allergy status to penicillin: Secondary | ICD-10-CM | POA: Diagnosis not present

## 2015-03-20 DIAGNOSIS — S0990XA Unspecified injury of head, initial encounter: Secondary | ICD-10-CM | POA: Diagnosis present

## 2015-03-20 DIAGNOSIS — Y998 Other external cause status: Secondary | ICD-10-CM | POA: Insufficient documentation

## 2015-03-20 DIAGNOSIS — S42301A Unspecified fracture of shaft of humerus, right arm, initial encounter for closed fracture: Secondary | ICD-10-CM

## 2015-03-20 DIAGNOSIS — Y9389 Activity, other specified: Secondary | ICD-10-CM | POA: Diagnosis not present

## 2015-03-20 DIAGNOSIS — Z7982 Long term (current) use of aspirin: Secondary | ICD-10-CM | POA: Diagnosis not present

## 2015-03-20 DIAGNOSIS — E785 Hyperlipidemia, unspecified: Secondary | ICD-10-CM | POA: Diagnosis not present

## 2015-03-20 DIAGNOSIS — S0001XA Abrasion of scalp, initial encounter: Secondary | ICD-10-CM

## 2015-03-20 DIAGNOSIS — E119 Type 2 diabetes mellitus without complications: Secondary | ICD-10-CM | POA: Diagnosis not present

## 2015-03-20 LAB — BASIC METABOLIC PANEL
Anion gap: 7 (ref 5–15)
BUN: 10 mg/dL (ref 6–20)
CO2: 23 mmol/L (ref 22–32)
Calcium: 8 mg/dL — ABNORMAL LOW (ref 8.9–10.3)
Chloride: 111 mmol/L (ref 101–111)
Creatinine, Ser: 0.56 mg/dL (ref 0.44–1.00)
GFR calc Af Amer: >60 mL/min (ref >60)
GFR calc non Af Amer: >60 mL/min (ref >60)
Glucose, Bld: 190 mg/dL — ABNORMAL HIGH (ref 65–99)
Potassium: 3.3 mmol/L — ABNORMAL LOW (ref 3.5–5.1)
Sodium: 141 mmol/L (ref 135–145)

## 2015-03-20 LAB — CBC
HCT: 29 % — ABNORMAL LOW (ref 36.0–46.0)
Hemoglobin: 9.6 g/dL — ABNORMAL LOW (ref 12.0–15.0)
MCH: 29 pg (ref 26.0–34.0)
MCHC: 33.1 g/dL (ref 30.0–36.0)
MCV: 87.6 fL (ref 78.0–100.0)
Platelets: 304 10*3/uL (ref 150–400)
RBC: 3.31 MIL/uL — ABNORMAL LOW (ref 3.87–5.11)
RDW: 13.1 % (ref 11.5–15.5)
WBC: 13.3 10*3/uL — ABNORMAL HIGH (ref 4.0–10.5)

## 2015-03-20 MED ORDER — MORPHINE SULFATE (PF) 4 MG/ML IV SOLN
4.0000 mg | Freq: Once | INTRAVENOUS | Status: AC
  Administered 2015-03-20: 4 mg via INTRAVENOUS
  Filled 2015-03-20: qty 1

## 2015-03-20 MED ORDER — FENTANYL CITRATE (PF) 100 MCG/2ML IJ SOLN
50.0000 ug | INTRAMUSCULAR | Status: AC
  Administered 2015-03-20: 50 ug via INTRAVENOUS
  Filled 2015-03-20: qty 2

## 2015-03-20 MED ORDER — LORAZEPAM 2 MG/ML IJ SOLN
1.0000 mg | Freq: Once | INTRAMUSCULAR | Status: AC
  Administered 2015-03-20: 1 mg via INTRAVENOUS
  Filled 2015-03-20: qty 1

## 2015-03-20 MED ORDER — MORPHINE SULFATE (PF) 4 MG/ML IV SOLN
4.0000 mg | INTRAVENOUS | Status: AC
  Administered 2015-03-20: 4 mg via INTRAVENOUS
  Filled 2015-03-20: qty 1

## 2015-03-20 MED ORDER — OXYCODONE HCL 5 MG PO TABS: 5.0000 mg | ORAL_TABLET | ORAL | Status: DC | PRN

## 2015-03-20 MED ORDER — SODIUM CHLORIDE 0.9 % IV BOLUS (SEPSIS)
1000.0000 mL | Freq: Once | INTRAVENOUS | Status: AC
  Administered 2015-03-20: 1000 mL via INTRAVENOUS

## 2015-03-20 NOTE — ED Notes (Signed)
Bed: [facility] Expected date:  Expected time:  Means of arrival:  Comments: EMS

## 2015-03-20 NOTE — ED Notes (Signed)
Pt's BP trending downward. MD [facility] made aware. I L NS started and obtained order for fentanyl. Will continue to monitor. Once BP stabilizes this RN will administer more morphine and ativan.

## 2015-03-20 NOTE — ED Notes (Signed)
Pt transported to CT ?
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
## 2015-03-20 NOTE — Discharge Instructions (Signed)
Humerus Fracture Treated With Immobilization °The humerus is the large bone in your upper arm. You have a broken (fractured) humerus. These fractures are easily diagnosed with X-rays. °TREATMENT  °Simple fractures which will heal without disability are treated with simple immobilization. Immobilization means you will wear a cast, splint, or sling. You have a fracture which will do well with immobilization. The fracture will heal well simply by being held in a good position until it is stable enough to begin range of motion exercises. Do not take part in activities which would further injure your arm.  °HOME CARE INSTRUCTIONS  °· Put ice on the injured area. °¨ Put ice in a plastic bag. °¨ Place a towel between your skin and the bag. °¨ Leave the ice on for 15-20 minutes, 03-04 times a day. °· If you have a cast: °¨ Do not scratch the skin under the cast using sharp or pointed objects. °¨ Check the skin around the cast every day. You may put lotion on any red or sore areas. °¨ Keep your cast dry and clean. °· If you have a splint: °¨ Wear the splint as directed. °¨ Keep your splint dry and clean. °¨ You may loosen the elastic around the splint if your fingers become numb, tingle, or turn cold or blue. °· If you have a sling: °¨ Wear the sling as directed. °· Do not put pressure on any part of your cast or splint until it is fully hardened. °· Your cast or splint can be protected during bathing with a plastic bag. Do not lower the cast or splint into water. °· Only take over-the-counter or prescription medicines for pain, discomfort, or fever as directed by your caregiver. °· Do range of motion exercises as instructed by your caregiver. °· Follow up as directed by your caregiver. This is very important in order to avoid permanent injury or disability and chronic pain. °SEEK IMMEDIATE MEDICAL CARE IF:  °· Your skin or nails in the injured arm turn blue or gray. °· Your arm feels cold or numb. °· You develop severe pain  in the injured arm. °· You are having problems with the medicines you were given. °MAKE SURE YOU:  °· Understand these instructions. °· Will watch your condition. °· Will get help right away if you are not doing well or get worse. °  °This information is not intended to replace advice given to you by your health care provider. Make sure you discuss any questions you have with your health care provider. °  °Document Released: 09/06/2000 Document Revised: 06/21/2014 Document Reviewed: 10/23/2014 °Elsevier Interactive Patient Education ©2016 [employer] ° °
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
## 2015-03-21 ENCOUNTER — Encounter (HOSPITAL_BASED_OUTPATIENT_CLINIC_OR_DEPARTMENT_OTHER): Payer: Self-pay | Admitting: *Deleted
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
## 2015-03-21 NOTE — H&P (Signed)
PREOPERATIVE H&P  Chief Complaint: RIGHT DISTAL HUMEROUS FRACTURE  HPI: Renee Lam is a 54 y.o. female who presents for preoperative history and physical with a diagnosis of RIGHT DISTAL HUMEROUS FRACTURE. Symptoms are rated as moderate to severe, and have been worsening.  This is significantly impairing activities of daily living.  She has elected for surgical management.   Past Medical History  Diagnosis Date  . Asthma   . Diabetes mellitus   . Arthritis   . Chicken pox   . GERD (gastroesophageal reflux disease)   . Allergy   . Hypertension   . Hyperlipidemia   . Migraine   . Viral meningitis   . Pericarditis   . Finger infection 2008    isolation   Past Surgical History  Procedure Laterality Date  . Breast surgery  2002    breast bx  . Cesarean section      1997, 2000  . Knee arthroscopy  2010    right  . Cardiac catheterization  2012   Social History   Social History  . Marital Status: Divorced    Spouse Name: N/A  . Number of Children: N/A  . Years of Education: N/A   Social History Main Topics  . Smoking status: Never Smoker   . Smokeless tobacco: Never Used  . Alcohol Use: No     Comment: once a year  . Drug Use: No  . Sexual Activity: Not on file   Other Topics Concern  . Not on file   Social History Narrative   Family History  Problem Relation Age of Onset  . Arthritis Other   . Cancer Other     colon,ovary/uterus,breast,prostate  . Hyperlipidemia Other   . Heart disease Other   . Hypertension Other   . Depression Other   . Colon cancer Neg Hx   . Stomach cancer Neg Hx   . Colon polyps Mother   . Diabetes Mother   . Heart failure Mother   . Diabetes Father   . Heart disease Father   . Arthritis Father   . Hypertension Father   . Heart failure Father    Allergies  Allergen Reactions  . Food     Strawberries, mangoes, pineapple, eggplant, zucchini  . Penicillins Hives    Has patient had a PCN reaction causing immediate  rash, facial/tongue/throat swelling, SOB or lightheadedness with hypotension: NoNo Has patient had a PCN reaction causing severe rash involving mucus membranes or skin necrosis: NoNo Has patient had a PCN reaction that required hospitalization NoNo Has patient had a PCN reaction occurring within the last 10 years: NoNo If all of the above answers are "NO", then may proceed with Cephalosporin   . Zithromax [Azithromycin] Hives    z-max; states can take Z pack   Prior to Admission medications   Medication Sig Start Date End Date Taking? Authorizing Provider  albuterol (PROVENTIL HFA;VENTOLIN HFA) 108 (90 BASE) MCG/ACT inhaler Inhale 2 puffs into the lungs every 4 (four) hours as needed. For shortness of breath/wheezing 02/01/14 05/11/15  Marletta Lor, MD  aspirin 81 MG tablet Take 81 mg by mouth daily.     Historical Provider, MD  Aspirin-Acetaminophen-Caffeine (EXCEDRIN EXTRA STRENGTH PO) Take by mouth as needed.    Historical Provider, MD  atorvastatin (LIPITOR) 80 MG tablet Take 1 tablet (80 mg total) by mouth at bedtime. 03/10/15 05/26/16  Marletta Lor, MD  Blood Glucose Monitoring Suppl ([facility]) W/DEVICE KIT by Does not apply  route.    Historical Provider, MD  Canagliflozin (INVOKANA) 300 MG TABS Take 1 tablet (300 mg total) by mouth daily. 02/01/14   Marletta Lor, MD  diphenhydrAMINE (BENADRYL) 25 MG tablet Take 25 mg by mouth every 6 (six) hours as needed.    Historical Provider, MD  EPINEPHrine (EPI-PEN) 0.3 mg/0.3 mL DEVI Inject 0.3 mg into the muscle daily as needed. For anaphylaxis    Historical Provider, MD  glucose blood (FREESTYLE TEST STRIPS) test strip Test 4 times daily. 02/01/14   Marletta Lor, MD  insulin aspart (NOVOLOG FLEXPEN) 100 UNIT/ML FlexPen 3 times a day (just before each meal) 04-13-39 units, and pen needles 4/day Patient taking differently: Inject 30 Units into the skin 3 (three) times daily with meals.  03/17/15   Renato Shin, MD  Insulin Detemir (LEVEMIR) 100 UNIT/ML Pen Inject 90 Units into the skin at bedtime. 03/10/15   Marletta Lor, MD  Insulin Pen Needle 32G X 4 MM MISC Test 4 times daily. 02/01/14   Marletta Lor, MD  metformin (FORTAMET) 500 MG (OSM) 24 hr tablet Take 1 tablet (500 mg total) by mouth daily with breakfast. 06/21/13   Marletta Lor, MD  naproxen sodium (ANAPROX) 220 MG tablet Take 220 mg by mouth as needed.    Historical Provider, MD  oxyCODONE (ROXICODONE) 5 MG immediate release tablet Take 1 tablet (5 mg total) by mouth every 3 (three) hours as needed for severe pain. 03/20/15   Jola Schmidt, MD     Positive ROS: All other systems have been reviewed and were otherwise negative with the exception of those mentioned in the HPI and as above.  Physical Exam: General: Alert, no acute distress Cardiovascular: No pedal edema Respiratory: No cyanosis, no use of accessory musculature GI: No organomegaly, abdomen is soft and non-tender Skin: No lesions in the area of chief complaint Neurologic: Sensation intact distally Psychiatric: Patient is competent for consent with normal mood and affect Lymphatic: No axillary or cervical lymphadenopathy  MUSCULOSKELETAL:  Right arm has moderate swelling.  Very tender to palpation over fracture site.  ROM limited due to pain.  Sensation intact with 2+ distal pulses.  Assessment: RIGHT DISTAL HUMEROUS FRACTURE  Plan: Plan for Procedure(s): OPEN REDUCTION INTERNAL FIXATION (ORIF) DISTAL HUMERUS FRACTURE  The risks benefits and alternatives were discussed with the patient including but not limited to the risks of nonoperative treatment, versus surgical intervention including infection, bleeding, nerve injury,  blood clots, cardiopulmonary complications, morbidity, mortality, among others, and they were willing to proceed.   Gae Dry, PA-C  03/21/2015 11:59 AM

## 2015-03-24 ENCOUNTER — Encounter (HOSPITAL_BASED_OUTPATIENT_CLINIC_OR_DEPARTMENT_OTHER)
Admission: RE | Admit: 2015-03-24 | Discharge: 2015-03-24 | Disposition: A | Discharge status: Home / Self Care | Payer: 59 | Source: Ambulatory Visit | Attending: Orthopedic Surgery | Admitting: Orthopedic Surgery

## 2015-03-24 DIAGNOSIS — X58XXXA Exposure to other specified factors, initial encounter: Secondary | ICD-10-CM | POA: Diagnosis not present

## 2015-03-24 DIAGNOSIS — Z880 Allergy status to penicillin: Secondary | ICD-10-CM | POA: Diagnosis not present

## 2015-03-24 DIAGNOSIS — I10 Essential (primary) hypertension: Secondary | ICD-10-CM | POA: Diagnosis not present

## 2015-03-24 DIAGNOSIS — Z794 Long term (current) use of insulin: Secondary | ICD-10-CM | POA: Diagnosis not present

## 2015-03-24 DIAGNOSIS — E785 Hyperlipidemia, unspecified: Secondary | ICD-10-CM | POA: Diagnosis not present

## 2015-03-24 DIAGNOSIS — Z6834 Body mass index (BMI) 34.0-34.9, adult: Secondary | ICD-10-CM | POA: Diagnosis not present

## 2015-03-24 DIAGNOSIS — S42401A Unspecified fracture of lower end of right humerus, initial encounter for closed fracture: Secondary | ICD-10-CM | POA: Diagnosis present

## 2015-03-24 DIAGNOSIS — E119 Type 2 diabetes mellitus without complications: Secondary | ICD-10-CM | POA: Diagnosis not present

## 2015-03-24 DIAGNOSIS — S42411A Displaced simple supracondylar fracture without intercondylar fracture of right humerus, initial encounter for closed fracture: Secondary | ICD-10-CM | POA: Diagnosis not present

## 2015-03-24 DIAGNOSIS — Z7982 Long term (current) use of aspirin: Secondary | ICD-10-CM | POA: Diagnosis not present

## 2015-03-24 LAB — BASIC METABOLIC PANEL
Anion gap: 11 (ref 5–15)
BUN: 7 mg/dL (ref 6–20)
CO2: 28 mmol/L (ref 22–32)
Calcium: 9 mg/dL (ref 8.9–10.3)
Chloride: 99 mmol/L — ABNORMAL LOW (ref 101–111)
Creatinine, Ser: 0.61 mg/dL (ref 0.44–1.00)
GFR calc Af Amer: >60 mL/min (ref >60)
GFR calc non Af Amer: >60 mL/min (ref >60)
Glucose, Bld: 245 mg/dL — ABNORMAL HIGH (ref 65–99)
Potassium: 3.4 mmol/L — ABNORMAL LOW (ref 3.5–5.1)
Sodium: 138 mmol/L (ref 135–145)

## 2015-03-25 ENCOUNTER — Ambulatory Visit (HOSPITAL_BASED_OUTPATIENT_CLINIC_OR_DEPARTMENT_OTHER)
Admission: RE | Admit: 2015-03-25 | Discharge: 2015-03-26 | Disposition: A | Discharge status: Home / Self Care | Payer: 59 | Source: Ambulatory Visit | Attending: Orthopedic Surgery | Admitting: Orthopedic Surgery

## 2015-03-25 ENCOUNTER — Ambulatory Visit (HOSPITAL_BASED_OUTPATIENT_CLINIC_OR_DEPARTMENT_OTHER): Payer: 59 | Admitting: Certified Registered"

## 2015-03-25 ENCOUNTER — Encounter (HOSPITAL_BASED_OUTPATIENT_CLINIC_OR_DEPARTMENT_OTHER)
Admission: RE | Disposition: A | Discharge status: Home / Self Care | Payer: Self-pay | Source: Ambulatory Visit | Attending: Orthopedic Surgery

## 2015-03-25 ENCOUNTER — Ambulatory Visit (HOSPITAL_COMMUNITY): Payer: 59

## 2015-03-25 ENCOUNTER — Encounter (HOSPITAL_BASED_OUTPATIENT_CLINIC_OR_DEPARTMENT_OTHER): Payer: Self-pay | Admitting: Certified Registered"

## 2015-03-25 DIAGNOSIS — E119 Type 2 diabetes mellitus without complications: Secondary | ICD-10-CM | POA: Insufficient documentation

## 2015-03-25 DIAGNOSIS — Z7982 Long term (current) use of aspirin: Secondary | ICD-10-CM | POA: Insufficient documentation

## 2015-03-25 DIAGNOSIS — S42411A Displaced simple supracondylar fracture without intercondylar fracture of right humerus, initial encounter for closed fracture: Secondary | ICD-10-CM | POA: Insufficient documentation

## 2015-03-25 DIAGNOSIS — Z6834 Body mass index (BMI) 34.0-34.9, adult: Secondary | ICD-10-CM | POA: Insufficient documentation

## 2015-03-25 DIAGNOSIS — I10 Essential (primary) hypertension: Secondary | ICD-10-CM | POA: Insufficient documentation

## 2015-03-25 DIAGNOSIS — Z794 Long term (current) use of insulin: Secondary | ICD-10-CM | POA: Insufficient documentation

## 2015-03-25 DIAGNOSIS — Z880 Allergy status to penicillin: Secondary | ICD-10-CM | POA: Insufficient documentation

## 2015-03-25 DIAGNOSIS — S42309A Unspecified fracture of shaft of humerus, unspecified arm, initial encounter for closed fracture: Secondary | ICD-10-CM | POA: Diagnosis present

## 2015-03-25 DIAGNOSIS — E785 Hyperlipidemia, unspecified: Secondary | ICD-10-CM | POA: Insufficient documentation

## 2015-03-25 DIAGNOSIS — S42301A Unspecified fracture of shaft of humerus, right arm, initial encounter for closed fracture: Secondary | ICD-10-CM

## 2015-03-25 DIAGNOSIS — X58XXXA Exposure to other specified factors, initial encounter: Secondary | ICD-10-CM | POA: Insufficient documentation

## 2015-03-25 HISTORY — PX: ORIF HUMERUS FRACTURE: SHX2126

## 2015-03-25 LAB — GLUCOSE, CAPILLARY
Glucose-Capillary: 132 mg/dL — ABNORMAL HIGH (ref 65–99)
Glucose-Capillary: 158 mg/dL — ABNORMAL HIGH (ref 65–99)
Glucose-Capillary: 235 mg/dL — ABNORMAL HIGH (ref 65–99)

## 2015-03-25 SURGERY — OPEN REDUCTION INTERNAL FIXATION (ORIF) DISTAL HUMERUS FRACTURE
Anesthesia: Regional | Site: Arm Upper | Laterality: Right

## 2015-03-25 MED ORDER — MIDAZOLAM HCL 2 MG/2ML IJ SOLN
INTRAMUSCULAR | Status: AC
  Filled 2015-03-25: qty 2

## 2015-03-25 MED ORDER — INSULIN ASPART 100 UNIT/ML FLEXPEN
30.0000 [IU] | PEN_INJECTOR | Freq: Three times a day (TID) | SUBCUTANEOUS | Status: DC
  Administered 2015-03-25: 30 [IU] via SUBCUTANEOUS

## 2015-03-25 MED ORDER — OXYCODONE-ACETAMINOPHEN 5-325 MG PO TABS: 1.0000 | ORAL_TABLET | ORAL | Status: DC | PRN

## 2015-03-25 MED ORDER — ONDANSETRON HCL 4 MG PO TABS: 4.0000 mg | ORAL_TABLET | Freq: Three times a day (TID) | ORAL | Status: DC | PRN

## 2015-03-25 MED ORDER — METFORMIN HCL ER 500 MG PO TB24: 500.0000 mg | ORAL_TABLET | Freq: Every day | ORAL | Status: DC

## 2015-03-25 MED ORDER — ATORVASTATIN CALCIUM 80 MG PO TABS: 80.0000 mg | ORAL_TABLET | Freq: Every day | ORAL | Status: DC

## 2015-03-25 MED ORDER — LIDOCAINE HCL (CARDIAC) 20 MG/ML IV SOLN
INTRAVENOUS | Status: AC
  Filled 2015-03-25: qty 5

## 2015-03-25 MED ORDER — INSULIN DETEMIR 100 UNIT/ML FLEXPEN
90.0000 [IU] | PEN_INJECTOR | Freq: Every day | SUBCUTANEOUS | Status: DC
  Administered 2015-03-25: 90 [IU] via SUBCUTANEOUS

## 2015-03-25 MED ORDER — HYDROMORPHONE HCL 1 MG/ML IJ SOLN
0.2500 mg | INTRAMUSCULAR | Status: DC | PRN
  Administered 2015-03-25 (×4): 0.5 mg via INTRAVENOUS

## 2015-03-25 MED ORDER — BUPIVACAINE-EPINEPHRINE (PF) 0.5% -1:200000 IJ SOLN
INTRAMUSCULAR | Status: DC | PRN
  Administered 2015-03-25: 25 mL via PERINEURAL

## 2015-03-25 MED ORDER — FENTANYL CITRATE (PF) 100 MCG/2ML IJ SOLN
INTRAMUSCULAR | Status: AC
  Filled 2015-03-25: qty 4

## 2015-03-25 MED ORDER — DEXAMETHASONE SODIUM PHOSPHATE 4 MG/ML IJ SOLN
INTRAMUSCULAR | Status: DC | PRN
  Administered 2015-03-25: 10 mg via INTRAVENOUS

## 2015-03-25 MED ORDER — CEFAZOLIN SODIUM-DEXTROSE 2-3 GM-% IV SOLR
2.0000 g | INTRAVENOUS | Status: AC
  Administered 2015-03-25: 2 g via INTRAVENOUS

## 2015-03-25 MED ORDER — MIDAZOLAM HCL 2 MG/2ML IJ SOLN
1.0000 mg | INTRAMUSCULAR | Status: DC | PRN
  Administered 2015-03-25: 2 mg via INTRAVENOUS

## 2015-03-25 MED ORDER — DOCUSATE SODIUM 100 MG PO CAPS
100.0000 mg | ORAL_CAPSULE | Freq: Two times a day (BID) | ORAL | Status: DC
Start: 2015-03-25 — End: 2015-03-26
  Administered 2015-03-25: 100 mg via ORAL
  Filled 2015-03-25: qty 1

## 2015-03-25 MED ORDER — ONDANSETRON HCL 4 MG/2ML IJ SOLN
4.0000 mg | Freq: Four times a day (QID) | INTRAMUSCULAR | Status: DC | PRN
  Administered 2015-03-26: 4 mg via INTRAVENOUS
  Filled 2015-03-25: qty 2

## 2015-03-25 MED ORDER — CEFAZOLIN SODIUM-DEXTROSE 2-3 GM-% IV SOLR
INTRAVENOUS | Status: AC
  Filled 2015-03-25: qty 50

## 2015-03-25 MED ORDER — SCOPOLAMINE 1 MG/3DAYS TD PT72: 1.0000 | MEDICATED_PATCH | Freq: Once | TRANSDERMAL | Status: DC | PRN

## 2015-03-25 MED ORDER — ACETAMINOPHEN 650 MG RE SUPP: 650.0000 mg | Freq: Four times a day (QID) | RECTAL | Status: DC | PRN

## 2015-03-25 MED ORDER — HYDROMORPHONE HCL 1 MG/ML IJ SOLN
0.2500 mg | INTRAMUSCULAR | Status: DC | PRN
  Administered 2015-03-25: 0.5 mg via INTRAVENOUS
  Filled 2015-03-25: qty 1

## 2015-03-25 MED ORDER — ONDANSETRON HCL 4 MG/2ML IJ SOLN
INTRAMUSCULAR | Status: DC | PRN
  Administered 2015-03-25: 4 mg via INTRAVENOUS

## 2015-03-25 MED ORDER — HYDROMORPHONE HCL 1 MG/ML IJ SOLN
INTRAMUSCULAR | Status: AC
  Filled 2015-03-25: qty 1

## 2015-03-25 MED ORDER — METOCLOPRAMIDE HCL 5 MG PO TABS: 5.0000 mg | ORAL_TABLET | Freq: Three times a day (TID) | ORAL | Status: DC | PRN

## 2015-03-25 MED ORDER — ASPIRIN 81 MG PO TABS: 81.0000 mg | ORAL_TABLET | Freq: Every day | ORAL | Status: DC

## 2015-03-25 MED ORDER — GLYCOPYRROLATE 0.2 MG/ML IJ SOLN
0.2000 mg | Freq: Once | INTRAMUSCULAR | Status: DC | PRN
Start: 2015-03-25 — End: 2015-03-25

## 2015-03-25 MED ORDER — OXYCODONE HCL 5 MG PO TABS
5.0000 mg | ORAL_TABLET | ORAL | Status: DC | PRN
  Administered 2015-03-25: 5 mg via ORAL
  Filled 2015-03-25: qty 1

## 2015-03-25 MED ORDER — ACETAMINOPHEN 500 MG PO TABS
ORAL_TABLET | ORAL | Status: AC
  Filled 2015-03-25: qty 2

## 2015-03-25 MED ORDER — ALBUTEROL SULFATE HFA 108 (90 BASE) MCG/ACT IN AERS: 2.0000 | INHALATION_SPRAY | RESPIRATORY_TRACT | Status: DC | PRN

## 2015-03-25 MED ORDER — SODIUM CHLORIDE 0.45 % IV SOLN
INTRAVENOUS | Status: DC
  Administered 2015-03-25: 15:00:00 via INTRAVENOUS

## 2015-03-25 MED ORDER — ACETAMINOPHEN 325 MG PO TABS: 650.0000 mg | ORAL_TABLET | Freq: Four times a day (QID) | ORAL | Status: DC | PRN

## 2015-03-25 MED ORDER — ACETAMINOPHEN 500 MG PO TABS
1000.0000 mg | ORAL_TABLET | Freq: Once | ORAL | Status: AC
  Administered 2015-03-25: 1000 mg via ORAL

## 2015-03-25 MED ORDER — DOCUSATE SODIUM 100 MG PO CAPS: 100.0000 mg | ORAL_CAPSULE | Freq: Two times a day (BID) | ORAL | Status: DC

## 2015-03-25 MED ORDER — GLYCOPYRROLATE 0.2 MG/ML IJ SOLN: 0.2000 mg | Freq: Once | INTRAMUSCULAR | Status: DC | PRN

## 2015-03-25 MED ORDER — OXYCODONE HCL 5 MG PO TABS: 5.0000 mg | ORAL_TABLET | Freq: Once | ORAL | Status: DC | PRN

## 2015-03-25 MED ORDER — CHLORHEXIDINE GLUCONATE 4 % EX LIQD: 60.0000 mL | Freq: Once | CUTANEOUS | Status: DC

## 2015-03-25 MED ORDER — OXYCODONE HCL 5 MG/5ML PO SOLN: 5.0000 mg | Freq: Once | ORAL | Status: DC | PRN

## 2015-03-25 MED ORDER — ONDANSETRON HCL 4 MG/2ML IJ SOLN
INTRAMUSCULAR | Status: AC
  Filled 2015-03-25: qty 2

## 2015-03-25 MED ORDER — PROMETHAZINE HCL 25 MG/ML IJ SOLN: 6.2500 mg | INTRAMUSCULAR | Status: DC | PRN

## 2015-03-25 MED ORDER — POTASSIUM CHLORIDE IN NACL 20-0.45 MEQ/L-% IV SOLN: INTRAVENOUS | Status: DC

## 2015-03-25 MED ORDER — ONDANSETRON HCL 4 MG PO TABS
4.0000 mg | ORAL_TABLET | Freq: Four times a day (QID) | ORAL | Status: DC | PRN
Start: 2015-03-25 — End: 2015-03-26

## 2015-03-25 MED ORDER — ARTIFICIAL TEARS OP OINT
TOPICAL_OINTMENT | OPHTHALMIC | Status: AC
  Filled 2015-03-25: qty 3.5

## 2015-03-25 MED ORDER — HYDROMORPHONE HCL 1 MG/ML IJ SOLN
1.0000 mg | INTRAMUSCULAR | Status: DC | PRN
  Administered 2015-03-26: 1 mg via INTRAVENOUS
  Filled 2015-03-25: qty 1

## 2015-03-25 MED ORDER — CEFAZOLIN SODIUM-DEXTROSE 2-3 GM-% IV SOLR
2.0000 g | Freq: Four times a day (QID) | INTRAVENOUS | Status: AC
  Administered 2015-03-25 – 2015-03-26 (×3): 2 g via INTRAVENOUS
  Filled 2015-03-25 (×3): qty 50

## 2015-03-25 MED ORDER — METOCLOPRAMIDE HCL 5 MG/ML IJ SOLN: 5.0000 mg | Freq: Three times a day (TID) | INTRAMUSCULAR | Status: DC | PRN

## 2015-03-25 MED ORDER — LACTATED RINGERS IV SOLN
INTRAVENOUS | Status: DC
  Administered 2015-03-25 (×3): via INTRAVENOUS

## 2015-03-25 MED ORDER — FENTANYL CITRATE (PF) 100 MCG/2ML IJ SOLN
INTRAMUSCULAR | Status: AC
  Filled 2015-03-25: qty 2

## 2015-03-25 MED ORDER — DEXAMETHASONE SODIUM PHOSPHATE 10 MG/ML IJ SOLN
INTRAMUSCULAR | Status: AC
  Filled 2015-03-25: qty 1

## 2015-03-25 MED ORDER — FENTANYL CITRATE (PF) 100 MCG/2ML IJ SOLN
50.0000 ug | INTRAMUSCULAR | Status: DC | PRN
  Administered 2015-03-25 (×2): 100 ug via INTRAVENOUS

## 2015-03-25 MED ORDER — LIDOCAINE HCL (CARDIAC) 20 MG/ML IV SOLN
INTRAVENOUS | Status: DC | PRN
  Administered 2015-03-25: 80 mg via INTRAVENOUS

## 2015-03-25 MED ORDER — PROPOFOL 10 MG/ML IV BOLUS
INTRAVENOUS | Status: DC | PRN
Start: 2015-03-25 — End: 2015-03-25
  Administered 2015-03-25: 200 mg via INTRAVENOUS

## 2015-03-25 MED ORDER — SUCCINYLCHOLINE CHLORIDE 20 MG/ML IJ SOLN
INTRAMUSCULAR | Status: DC | PRN
  Administered 2015-03-25: 100 mg via INTRAVENOUS

## 2015-03-25 MED ORDER — METHYLPREDNISOLONE SODIUM SUCC 125 MG IJ SOLR
INTRAMUSCULAR | Status: AC
  Filled 2015-03-25: qty 4

## 2015-03-25 MED ORDER — METHOCARBAMOL 500 MG PO TABS: 500.0000 mg | ORAL_TABLET | Freq: Four times a day (QID) | ORAL | Status: DC

## 2015-03-25 SURGICAL SUPPLY — 84 items
2.5 DRILL BIT ×2 IMPLANT
2.8 DRILL BIT ×2 IMPLANT
BANDAGE ELASTIC 4 VELCRO ST LF (GAUZE/BANDAGES/DRESSINGS) ×4 IMPLANT
BIT DRILL 2.5X110 QC LCP DISP (BIT) ×2 IMPLANT
BIT DRILL QC 3.5X110 (BIT) ×2 IMPLANT
BLADE SURG 15 STRL LF DISP TIS (BLADE) ×1 IMPLANT
BLADE SURG 15 STRL SS (BLADE) ×1
BNDG COHESIVE 4X5 TAN STRL (GAUZE/BANDAGES/DRESSINGS) ×2 IMPLANT
BNDG ESMARK 4X9 LF (GAUZE/BANDAGES/DRESSINGS) ×2 IMPLANT
CANISTER SUCT 1200ML W/VALVE (MISCELLANEOUS) ×2 IMPLANT
CHLORAPREP W/TINT 26ML (MISCELLANEOUS) ×2 IMPLANT
CLSR STERI-STRIP ANTIMIC 1/2X4 (GAUZE/BANDAGES/DRESSINGS) ×2 IMPLANT
COVER BACK TABLE 60X90IN (DRAPES) ×2 IMPLANT
CUFF TOURNIQUET SINGLE 18IN (TOURNIQUET CUFF) IMPLANT
DECANTER SPIKE VIAL GLASS SM (MISCELLANEOUS) IMPLANT
DRAPE EXTREMITY T 121X128X90 (DRAPE) ×2 IMPLANT
DRAPE INCISE IOBAN 66X45 STRL (DRAPES) ×2 IMPLANT
DRAPE U 20/CS (DRAPES) ×2 IMPLANT
DRAPE U-SHAPE 47X51 STRL (DRAPES) ×2 IMPLANT
DRSG PAD ABDOMINAL 8X10 ST (GAUZE/BANDAGES/DRESSINGS) ×2 IMPLANT
ELECT REM PT RETURN 9FT ADLT (ELECTROSURGICAL) ×2
ELECTRODE REM PT RTRN 9FT ADLT (ELECTROSURGICAL) ×1 IMPLANT
GAUZE SPONGE 4X4 12PLY STRL (GAUZE/BANDAGES/DRESSINGS) ×2 IMPLANT
GAUZE XEROFORM 1X8 LF (GAUZE/BANDAGES/DRESSINGS) IMPLANT
GLOVE BIO SURGEON STRL SZ 6.5 (GLOVE) ×2 IMPLANT
GLOVE BIO SURGEON STRL SZ7 (GLOVE) ×2 IMPLANT
GLOVE BIO SURGEON STRL SZ7.5 (GLOVE) ×2 IMPLANT
GLOVE BIO SURGEON STRL SZ8 (GLOVE) ×2 IMPLANT
GLOVE BIOGEL PI IND STRL 7.0 (GLOVE) ×3 IMPLANT
GLOVE BIOGEL PI IND STRL 8 (GLOVE) ×2 IMPLANT
GLOVE BIOGEL PI INDICATOR 7.0 (GLOVE) ×3
GLOVE BIOGEL PI INDICATOR 8 (GLOVE) ×2
GLOVE ORTHO TXT STRL SZ7.5 (GLOVE) IMPLANT
GOWN STRL REUS W/ TWL LRG LVL3 (GOWN DISPOSABLE) ×2 IMPLANT
GOWN STRL REUS W/ TWL XL LVL3 (GOWN DISPOSABLE) ×1 IMPLANT
GOWN STRL REUS W/TWL LRG LVL3 (GOWN DISPOSABLE) ×2
GOWN STRL REUS W/TWL XL LVL3 (GOWN DISPOSABLE) ×3 IMPLANT
NEEDLE 1/2 CIR CATGUT .05X1.09 (NEEDLE) IMPLANT
NEEDLE HYPO 25X1 1.5 SAFETY (NEEDLE) IMPLANT
NS IRRIG 1000ML POUR BTL (IV SOLUTION) ×2 IMPLANT
PACK BASIN DAY SURGERY FS (CUSTOM PROCEDURE TRAY) ×2 IMPLANT
PAD CAST 4YDX4 CTTN HI CHSV (CAST SUPPLIES) ×2 IMPLANT
PADDING CAST ABS 4INX4YD NS (CAST SUPPLIES)
PADDING CAST ABS COTTON 4X4 ST (CAST SUPPLIES) IMPLANT
PADDING CAST COTTON 4X4 STRL (CAST SUPPLIES) ×2
PENCIL BUTTON HOLSTER BLD 10FT (ELECTRODE) ×2 IMPLANT
PLATE HUMERUS 3.5 LCP DISTAL (Plate) ×2 IMPLANT
SCREW CORTEX 3.5 22MM (Screw) ×1 IMPLANT
SCREW CORTEX 3.5 24MM (Screw) ×2 IMPLANT
SCREW CORTEX 3.5 26MM (Screw) ×2 IMPLANT
SCREW CORTEX 3.5 28MM (Screw) ×1 IMPLANT
SCREW CORTEX 3.5 30MM (Screw) ×1 IMPLANT
SCREW LOCK CORT ST 3.5X22 (Screw) ×1 IMPLANT
SCREW LOCK CORT ST 3.5X24 (Screw) ×2 IMPLANT
SCREW LOCK CORT ST 3.5X26 (Screw) ×2 IMPLANT
SCREW LOCK CORT ST 3.5X28 (Screw) ×1 IMPLANT
SCREW LOCK CORT ST 3.5X30 (Screw) ×1 IMPLANT
SCREW LOCK T15 FT 20X3.5XST (Screw) ×2 IMPLANT
SCREW LOCK T15 FT 24X3.5X2.9X (Screw) ×1 IMPLANT
SCREW LOCKING 3.5X20 (Screw) ×2 IMPLANT
SCREW LOCKING 3.5X24 (Screw) ×1 IMPLANT
SLEEVE SCD COMPRESS KNEE MED (MISCELLANEOUS) ×2 IMPLANT
SLING ARM LRG ADULT FOAM STRAP (SOFTGOODS) IMPLANT
SLING ARM MED ADULT FOAM STRAP (SOFTGOODS) IMPLANT
SPLINT FAST PLASTER 5X30 (CAST SUPPLIES)
SPLINT PLASTER CAST FAST 5X30 (CAST SUPPLIES) IMPLANT
SPONGE LAP 18X18 X RAY DECT (DISPOSABLE) ×2 IMPLANT
STOCKINETTE IMPERVIOUS LG (DRAPES) ×2 IMPLANT
SUCTION FRAZIER TIP 10 FR DISP (SUCTIONS) ×2 IMPLANT
SUT FIBERWIRE #2 38 T-5 BLUE (SUTURE)
SUT MNCRL AB 4-0 PS2 18 (SUTURE) ×2 IMPLANT
SUT MON AB 2-0 CT1 36 (SUTURE) ×6 IMPLANT
SUT VIC AB 0 CT1 27 (SUTURE)
SUT VIC AB 0 CT1 27XBRD ANBCTR (SUTURE) IMPLANT
SUT VIC AB 2-0 SH 27 (SUTURE)
SUT VIC AB 2-0 SH 27XBRD (SUTURE) IMPLANT
SUT VICRYL 3-0 CR8 SH (SUTURE) ×2 IMPLANT
SUTURE FIBERWR #2 38 T-5 BLUE (SUTURE) IMPLANT
SYR BULB 3OZ (MISCELLANEOUS) ×2 IMPLANT
TOWEL OR 17X24 6PK STRL BLUE (TOWEL DISPOSABLE) ×2 IMPLANT
TOWEL OR NON WOVEN STRL DISP B (DISPOSABLE) ×4 IMPLANT
TUBE CONNECTING 20X1/4 (TUBING) ×2 IMPLANT
UNDERPAD 30X30 (UNDERPADS AND DIAPERS) ×2 IMPLANT
YANKAUER SUCT BULB TIP NO VENT (SUCTIONS) ×2 IMPLANT

## 2015-03-25 NOTE — Anesthesia Preprocedure Evaluation (Addendum)
Anesthesia Evaluation  Patient identified by MRN, date of birth, ID band Patient awake    Reviewed: Allergy & Precautions, NPO status , Patient's Chart, lab work & pertinent test results  Airway Mallampati: I  TM Distance: >3 FB Neck ROM: Full    Dental  (+) Teeth Intact, Dental Advisory Given   Pulmonary asthma ,    breath sounds clear to auscultation       Cardiovascular hypertension, Pt. on medications  Rhythm:Regular Rate:Normal     Neuro/Psych    GI/Hepatic GERD  Medicated and Controlled,  Endo/Other  diabetes, Well Controlled, Type 2, Insulin Dependent, Oral Hypoglycemic AgentsMorbid obesity  Renal/GU      Musculoskeletal   Abdominal   Peds  Hematology   Anesthesia Other Findings   Reproductive/Obstetrics                            Anesthesia Physical Anesthesia Plan  ASA: I  Anesthesia Plan: General and Regional   Post-op Pain Management:    Induction: Intravenous  Airway Management Planned: Oral ETT  Additional Equipment:   Intra-op Plan:   Post-operative Plan: Extubation in OR  Informed Consent: I have reviewed the patients History and Physical, chart, labs and discussed the procedure including the risks, benefits and alternatives for the proposed anesthesia with the patient or authorized representative who has indicated his/her understanding and acceptance.   Dental advisory given  Plan Discussed with: CRNA, Anesthesiologist and Surgeon  Anesthesia Plan Comments:        Anesthesia Quick Evaluation

## 2015-03-25 NOTE — Discharge Instructions (Signed)
Keep splint clean and dry till follow up  Non-weight bearing in the arm, sling for comfort

## 2015-03-25 NOTE — Interval H&P Note (Signed)
History and Physical Interval Note:  03/25/2015 8:20 AM  Renee Lam  has presented today for surgery, with the diagnosis of RIGHT DISTAL HUMEROUS FRACTURE  The various methods of treatment have been discussed with the patient and family. After consideration of risks, benefits and other options for treatment, the patient has consented to  Procedure(s): OPEN REDUCTION INTERNAL FIXATION (ORIF) DISTAL HUMERUS FRACTURE (Right) as a surgical intervention .  The patient's history has been reviewed, patient examined, no change in status, stable for surgery.  I have reviewed the patient's chart and labs.  Questions were answered to the patient's satisfaction.     Haelie Clapp D

## 2015-03-25 NOTE — Anesthesia Procedure Notes (Addendum)
Anesthesia Regional Block:  Supraclavicular block  Pre-Anesthetic Checklist: ,, timeout performed, Correct Patient, Correct Site, Correct Laterality, Correct Procedure, Correct Position, site marked, Risks and benefits discussed,  Surgical consent,  Pre-op evaluation,  At surgeon's request and post-op pain management  Laterality: Right and Upper  Prep: chloraprep       Needles:  Injection technique: Single-shot  Needle Type: Echogenic Stimulator Needle     Needle Length: 5cm 5 cm Needle Gauge: 21 and 21 G    Additional Needles:  Procedures: ultrasound guided (picture in chart) Supraclavicular block Narrative:  Start time: 03/25/2015 8:56 AM End time: 03/25/2015 9:03 AM Injection made incrementally with aspirations every 5 mL.  Performed by: Personally  Anesthesiologist: CREWS, DAVID   Procedure Name: Intubation Date/Time: 03/25/2015 10:53 AM Performed by: Baxter Flattery Pre-anesthesia Checklist: Patient identified, Emergency Drugs available, Suction available and Patient being monitored Patient Re-evaluated:Patient Re-evaluated prior to inductionOxygen Delivery Method: Circle System Utilized Preoxygenation: Pre-oxygenation with 100% oxygen Intubation Type: IV induction Ventilation: Mask ventilation without difficulty Grade View: Grade II Tube type: Oral Tube size: 7.0 mm Number of attempts: 1 Airway Equipment and Method: Stylet and Oral airway Placement Confirmation: ETT inserted through vocal cords under direct vision,  positive ETCO2 and breath sounds checked- equal and bilateral Secured at: 22 cm Tube secured with: Tape Dental Injury: Teeth and Oropharynx as per pre-operative assessment

## 2015-03-25 NOTE — Interval H&P Note (Signed)
History and Physical Interval Note:  03/25/2015 10:31 AM  Sunday Corn  has presented today for surgery, with the diagnosis of RIGHT DISTAL HUMEROUS FRACTURE  The various methods of treatment have been discussed with the patient and family. After consideration of risks, benefits and other options for treatment, the patient has consented to  Procedure(s): OPEN REDUCTION INTERNAL FIXATION (ORIF) DISTAL HUMERUS FRACTURE (Right) as a surgical intervention .  The patient's history has been reviewed, patient examined, no change in status, stable for surgery.  I have reviewed the patient's chart and labs.  Questions were answered to the patient's satisfaction.     Renee Lam

## 2015-03-25 NOTE — Transfer of Care (Signed)
Immediate Anesthesia Transfer of Care Note  Patient: Renee Lam  Procedure(s) Performed: Procedure(s): OPEN REDUCTION INTERNAL FIXATION (ORIF) DISTAL HUMERUS FRACTURE (Right)  Patient Location: PACU  Anesthesia Type:GA combined with regional for post-op pain  Level of Consciousness: awake, alert  and confused  Airway & Oxygen Therapy: Patient Spontanous Breathing and Patient connected to face mask oxygen  Post-op Assessment: Report given to RN, Post -op Vital signs reviewed and stable and Patient moving all extremities  Post vital signs: Reviewed and stable  Last Vitals:  Filed Vitals:   03/25/15 0915  BP:   Pulse: 93  Temp:   Resp: 21    Complications: No apparent anesthesia complications

## 2015-03-25 NOTE — Progress Notes (Signed)
Spoke with DR Alain Marion cancelled port x ray.

## 2015-03-25 NOTE — Anesthesia Postprocedure Evaluation (Signed)
  Anesthesia Post-op Note  Patient: Renee Lam  Procedure(s) Performed: Procedure(s): OPEN REDUCTION INTERNAL FIXATION (ORIF) DISTAL HUMERUS FRACTURE (Right)  Patient Location: PACU  Anesthesia Type: General, Regional   Level of Consciousness: awake, alert  and oriented  Airway and Oxygen Therapy: Patient Spontanous Breathing  Post-op Pain: mild  Post-op Assessment: Post-op Vital signs reviewed  Post-op Vital Signs: Reviewed  Last Vitals:  Filed Vitals:   03/25/15 1600  BP: 156/81  Pulse: 85  Temp: 37.1 C  Resp: 16    Complications: No apparent anesthesia complications

## 2015-03-25 NOTE — Progress Notes (Signed)
Assisted Dr. Crews with right, ultrasound guided, supraclavicular block. Side rails up, monitors on throughout procedure. See vital signs in flow sheet. Tolerated Procedure well. 

## 2015-03-26 DIAGNOSIS — S42411A Displaced simple supracondylar fracture without intercondylar fracture of right humerus, initial encounter for closed fracture: Secondary | ICD-10-CM | POA: Diagnosis not present

## 2015-03-26 LAB — GLUCOSE, CAPILLARY: Glucose-Capillary: 141 mg/dL — ABNORMAL HIGH (ref 65–99)

## 2015-03-27 ENCOUNTER — Encounter (HOSPITAL_BASED_OUTPATIENT_CLINIC_OR_DEPARTMENT_OTHER): Payer: Self-pay | Admitting: Orthopedic Surgery

## 2015-03-27 NOTE — Op Note (Signed)
03/25/2015 - 03/26/2015  12:28 PM  PATIENT:  Renee Lam    PRE-OPERATIVE DIAGNOSIS:  RIGHT DISTAL HUMEROUS FRACTURE  POST-OPERATIVE DIAGNOSIS:  Same  PROCEDURE:  OPEN REDUCTION INTERNAL FIXATION (ORIF) DISTAL HUMERUS FRACTURE  SURGEON:  Klarisa Barman, Ernesta Amble, MD  ASSISTANT: Lovett Calender, PA-C, She was present and scrubbed throughout the case, critical for completion in a timely fashion, and for retraction, instrumentation, and closure.   ANESTHESIA:   gen  PREOPERATIVE INDICATIONS:  Renee Lam is a  54 y.o. female with a diagnosis of RIGHT DISTAL HUMEROUS FRACTURE who failed conservative measures and elected for surgical management.    The risks benefits and alternatives were discussed with the patient preoperatively including but not limited to the risks of infection, bleeding, nerve injury, cardiopulmonary complications, the need for revision surgery, among others, and the patient was willing to proceed.  OPERATIVE IMPLANTS: synthese elbow plate  OPERATIVE FINDINGS: stable reduction  BLOOD LOSS: 427  COMPLICATIONS: none  TOURNIQUET TIME: none  OPERATIVE PROCEDURE:  Patient was identified in the preoperative holding area and site was marked by me She was transported to the operating theater and placed on the table in supine position taking care to pad all bony prominences. After a preincinduction time out anesthesia was induced. The right upper extremity was prepped and draped in normal sterile fashion and a pre-incision timeout was performed. She received ancef for preoperative antibiotics.   An positioner in the lateral position placing a ax roll and making sure to pad all bony prominences. Her arm was draped over the sure foot.  The right upper extremity was then prepped and draped in normal sterile fashion.  I then made a roughly 10 cm incision over her posterior arm starting at her olecranon tip working up. I dissected down to her triceps fascia and then split  her triceps muscle in line with the fibers. Identified her proximal supracondylar humerus fracture. I cleaned all soft tissue out of the fracture site.  I then reduced the fracture placing a clamp across it I placed 2 lag screws with excellent purchase. I removed the clamps and the fracture had good stability.  Then elevated subperiosteally up the bone making sure to elevate the radial nerve off of the back of the humerus and slid a posterior lateral Synthes locking plate up the arm.  Carrying it distally because the plate to be slightly prominent side and potentially cheated it slightly proximal.  I placed a screw proximally and had a good bite. I then took multiple x-rays I placed locking screws distally followed by 4 good shaft screws proximally again and confirmed plate was on the bone with no soft tissue trapped beneath it.  I then thoroughly irrigated her incision and I closed her skin with a absorbable stitch.  Sterile dressings were applied she was placed in a long-arm splint taking the PACU in stable condition.  POST OPERATIVE PLAN: NWB RUE, ambulate for dvt px.     This note was generated using a template and dragon dictation system. In light of that, I have reviewed the note and all aspects of it are applicable to this case. Any dictation errors are due to the computerized dictation system.

## 2015-04-03 ENCOUNTER — Encounter (HOSPITAL_BASED_OUTPATIENT_CLINIC_OR_DEPARTMENT_OTHER): Payer: Self-pay | Admitting: Orthopedic Surgery

## 2015-04-08 ENCOUNTER — Telehealth: Payer: Self-pay | Admitting: Endocrinology

## 2015-04-08 ENCOUNTER — Telehealth: Payer: Self-pay | Admitting: Internal Medicine
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
## 2015-04-08 NOTE — Telephone Encounter (Signed)
Please increase the novolog to 3 times a day (just before each meal) 15-35-45 units. Ret as scheduled
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
## 2015-04-08 NOTE — Telephone Encounter (Signed)
Pt already has an appt

## 2015-04-08 NOTE — Telephone Encounter (Signed)
Pt is not due to come back to see Dr. Raliegh Ip till end of December per last note. She does not need to see him for her arm as long as she is being followed by Ortho.

## 2015-04-08 NOTE — Telephone Encounter (Signed)
See note below and please advise, Thanks! 

## 2015-04-08 NOTE — Telephone Encounter (Signed)
Patient advised of note below and voiced understanding.  

## 2015-04-18 ENCOUNTER — Ambulatory Visit (INDEPENDENT_AMBULATORY_CARE_PROVIDER_SITE_OTHER): Payer: 59 | Admitting: Endocrinology

## 2015-04-18 ENCOUNTER — Encounter: Payer: Self-pay | Admitting: Endocrinology

## 2015-04-18 VITALS — BP 142/94 | HR 88 | Temp 98.8°F | Wt 165.0 lb

## 2015-04-18 DIAGNOSIS — E1042 Type 1 diabetes mellitus with diabetic polyneuropathy: Secondary | ICD-10-CM

## 2015-04-18 MED ORDER — INSULIN ASPART 100 UNIT/ML FLEXPEN: PEN_INJECTOR | SUBCUTANEOUS | Status: DC
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
## 2015-04-18 NOTE — Patient Instructions (Addendum)
check your blood sugar twice a day.  vary the time of day when you check, between before the 3 meals, and at bedtime.  also check if you have symptoms of your blood sugar being too high or too low.  please keep a record of the readings and bring it to your next appointment here.  You can write it on any piece of paper.  please call us sooner if your blood sugar goes below 70, or if you have a lot of readings over 200.   Please continue the same diabetes pills, and: Please continue the same levemir, and: increase the novolog to 3 times a day (just before each meal) 25-45-55 units.   Please come back for a follow-up appointment in 1 month.   Please call next week, to tell us how the blood sugar is doing.

## 2015-05-19 ENCOUNTER — Ambulatory Visit (INDEPENDENT_AMBULATORY_CARE_PROVIDER_SITE_OTHER): Payer: 59 | Admitting: Endocrinology

## 2015-05-19 ENCOUNTER — Encounter: Payer: Self-pay | Admitting: Endocrinology

## 2015-05-19 VITALS — BP 120/76 | HR 103 | Temp 98.9°F | Ht <= 58 in | Wt 169.0 lb

## 2015-05-19 DIAGNOSIS — E109 Type 1 diabetes mellitus without complications: Secondary | ICD-10-CM

## 2015-05-19 DIAGNOSIS — E1042 Type 1 diabetes mellitus with diabetic polyneuropathy: Secondary | ICD-10-CM

## 2015-05-19 MED ORDER — GLUCOSE BLOOD VI STRP: ORAL_STRIP | Status: DC

## 2015-05-19 MED ORDER — INSULIN ASPART 100 UNIT/ML FLEXPEN: PEN_INJECTOR | SUBCUTANEOUS | Status: DC

## 2015-05-19 MED ORDER — INSULIN DETEMIR 100 UNIT/ML FLEXPEN: 70.0000 [IU] | PEN_INJECTOR | Freq: Every day | SUBCUTANEOUS | Status: DC

## 2015-05-19 MED ORDER — INSULIN PEN NEEDLE 32G X 4 MM MISC: 1.0000 | Freq: Four times a day (QID) | Status: DC

## 2015-05-19 NOTE — Progress Notes (Signed)
Subjective:    Patient ID: Renee Lam, female    DOB: 09-Jul-1960, 54 y.o.   MRN: [id number]  HPI Pt returns for f/u of diabetes mellitus: DM type: Insulin-requiring type 2 Dx'ed: [id number] Complications: polyneuropathy Therapy: insulin since 2010 GDM: 1996 and 1999 DKA: never Severe hypoglycemia: never Pancreatitis: never Other: she takes multiple daily injections.   Interval history: no cbg record, but states cbg's vary from 85-200.  It is in general higher as the day goes on, but she does not check at hs.  She has occasional mild hypoglycemia in the middle of the night. Past Medical History  Diagnosis Date  . Asthma   . Diabetes mellitus   . Arthritis   . Chicken pox   . GERD (gastroesophageal reflux disease)   . Allergy   . Hypertension   . Hyperlipidemia   . Migraine   . Viral meningitis   . Pericarditis   . Finger infection 2008    isolation    Past Surgical History  Procedure Laterality Date  . Breast surgery  2002    breast bx  . Cesarean section      1997, 2000  . Knee arthroscopy  2010    right  . Cardiac catheterization  2012  . Orif humerus fracture Right 03/25/2015    Procedure: OPEN REDUCTION INTERNAL FIXATION (ORIF) DISTAL HUMERUS FRACTURE;  Surgeon: Renette Butters, MD;  Location: [facility];  Service: Orthopedics;  Laterality: Right;    Social History   Social History  . Marital Status: Divorced    Spouse Name: N/A  . Number of Children: N/A  . Years of Education: N/A   Occupational History  . Not on file.   Social History Main Topics  . Smoking status: Never Smoker   . Smokeless tobacco: Never Used  . Alcohol Use: Yes     Comment: once a year  . Drug Use: No  . Sexual Activity: Yes    Birth Control/ Protection: Post-menopausal   Other Topics Concern  . Not on file   Social History Narrative    Current Outpatient Prescriptions on File Prior to Visit  Medication Sig Dispense Refill  . aspirin 81 MG tablet Take  81 mg by mouth daily.     Marland Kitchen atorvastatin (LIPITOR) 80 MG tablet Take 1 tablet (80 mg total) by mouth at bedtime. 90 tablet 4  . Canagliflozin (INVOKANA) 300 MG TABS Take 1 tablet (300 mg total) by mouth daily. 30 tablet 6  . diphenhydrAMINE (BENADRYL) 25 MG tablet Take 25 mg by mouth every 6 (six) hours as needed.    . docusate sodium (COLACE) 100 MG capsule Take 1 capsule (100 mg total) by mouth 2 (two) times daily. 10 capsule 0  . EPINEPHrine (EPI-PEN) 0.3 mg/0.3 mL DEVI Inject 0.3 mg into the muscle daily as needed. For anaphylaxis    . metformin (FORTAMET) 500 MG (OSM) 24 hr tablet Take 1 tablet (500 mg total) by mouth daily with breakfast. 90 tablet 3  . albuterol (PROVENTIL HFA;VENTOLIN HFA) 108 (90 BASE) MCG/ACT inhaler Inhale 2 puffs into the lungs every 4 (four) hours as needed. For shortness of breath/wheezing 1 Inhaler 5   No current facility-administered medications on file prior to visit.    Family History  Problem Relation Age of Onset  . Arthritis Other   . Cancer Other     colon,ovary/uterus,breast,prostate  . Hyperlipidemia Other   . Heart disease Other   . Hypertension Other   .  Depression Other   . Colon cancer Neg Hx   . Stomach cancer Neg Hx   . Colon polyps Mother   . Diabetes Mother   . Heart failure Mother   . Diabetes Father   . Heart disease Father   . Arthritis Father   . Hypertension Father   . Heart failure Father     BP 120/76 mmHg  Pulse 103  Temp(Src) 98.9 F (37.2 C) (Oral)  Ht 4\' 10"  (1.473 m)  Wt 169 lb (76.658 kg)  BMI 35.33 kg/m2  SpO2 92%  Review of Systems Denies LOC    Objective:   Physical Exam VITAL SIGNS:  See vs page GENERAL: no distress. Pulses: dorsalis pedis intact bilat.   MSK: no deformity of the feet CV: no leg edema Skin:  no ulcer on the feet.  normal color and temp on the feet.   Neuro: sensation is intact to touch on the feet.       Assessment & Plan:  DM: Based on the pattern of her cbg's, she needs some  adjustment in her therapy.    Patient is advised the following: Patient Instructions  check your blood sugar twice a day.  vary the time of day when you check, between before the 3 meals, and at bedtime.  also check if you have symptoms of your blood sugar being too high or too low.  please keep a record of the readings and bring it to your next appointment here.  You can write it on any piece of paper.  please call us sooner if your blood sugar goes below 70, or if you have a lot of readings over 200.   Please continue the same diabetes pills, and: Please reduce the levemir to 70 units at bedtime, and: increase the novolog to 3 times a day (just before each meal) 35-55-55 units.   Please come back for a follow-up appointment in 3 months  Here is a paper, to get the a1c done at [facility].

## 2015-05-19 NOTE — Patient Instructions (Addendum)
check your blood sugar twice a day.  vary the time of day when you check, between before the 3 meals, and at bedtime.  also check if you have symptoms of your blood sugar being too high or too low.  please keep a record of the readings and bring it to your next appointment here.  You can write it on any piece of paper.  please call us sooner if your blood sugar goes below 70, or if you have a lot of readings over 200.   Please continue the same diabetes pills, and: Please reduce the levemir to 70 units at bedtime, and: increase the novolog to 3 times a day (just before each meal) 35-55-55 units.   Please come back for a follow-up appointment in 3 months  Here is a paper, to get the a1c done at [facility].

## 2015-06-03 ENCOUNTER — Ambulatory Visit (INDEPENDENT_AMBULATORY_CARE_PROVIDER_SITE_OTHER): Payer: 59 | Admitting: Internal Medicine

## 2015-06-03 ENCOUNTER — Encounter: Payer: Self-pay | Admitting: Internal Medicine

## 2015-06-03 VITALS — BP 136/88 | HR 80 | Temp 98.7°F | Resp 20 | Ht <= 58 in | Wt 173.0 lb

## 2015-06-03 DIAGNOSIS — J45909 Unspecified asthma, uncomplicated: Secondary | ICD-10-CM | POA: Diagnosis not present

## 2015-06-03 DIAGNOSIS — J4520 Mild intermittent asthma, uncomplicated: Secondary | ICD-10-CM | POA: Diagnosis not present

## 2015-06-03 DIAGNOSIS — J209 Acute bronchitis, unspecified: Secondary | ICD-10-CM | POA: Diagnosis not present

## 2015-06-03 MED ORDER — ALBUTEROL SULFATE HFA 108 (90 BASE) MCG/ACT IN AERS: 2.0000 | INHALATION_SPRAY | RESPIRATORY_TRACT | Status: DC | PRN

## 2015-06-03 MED ORDER — ATORVASTATIN CALCIUM 80 MG PO TABS: 80.0000 mg | ORAL_TABLET | Freq: Every day | ORAL | Status: DC

## 2015-06-03 MED ORDER — EPINEPHRINE 0.3 MG/0.3ML IJ SOAJ: 0.3000 mg | Freq: Once | INTRAMUSCULAR | Status: DC

## 2015-06-03 NOTE — Patient Instructions (Addendum)
Limit your sodium (Salt) intake  Please check your blood pressure on a regular basis.  If it is consistently greater than 150/90, please make an office appointment.  Return in 6 months for follow-up    Please check your hemoglobin A1c every 3 months   Please see your eye doctor yearly to check for diabetic eye damage
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
## 2015-06-18 ENCOUNTER — Telehealth: Payer: Self-pay | Admitting: Endocrinology

## 2015-06-18 MED ORDER — INSULIN GLARGINE 100 UNIT/ML SOLOSTAR PEN: 40.0000 [IU] | PEN_INJECTOR | Freq: Every day | SUBCUTANEOUS | Status: DC

## 2015-06-18 NOTE — Telephone Encounter (Signed)
I contacted the pt and advised of note below. Pt verbalized understanding.  

## 2015-06-18 NOTE — Telephone Encounter (Signed)
Ok, i have sent a prescription to your pharmacy, to change to lantus. It is not a unit-for-unit transfer, so we'll start at 40 units at bedtime. i'll see you next time.

## 2015-06-18 NOTE — Telephone Encounter (Signed)
See note below and please advise, Thanks! 

## 2015-06-18 NOTE — Telephone Encounter (Signed)
Patient called stating that her insurance will no longer cover her Rx   Rx: Levemir  Pharmacy: [facility]  Please advise patient of another alternative    Thank you

## 2015-06-23 ENCOUNTER — Telehealth: Payer: Self-pay | Admitting: Endocrinology

## 2015-06-23 NOTE — Telephone Encounter (Signed)
Pt advised of note below and voiced understanding.  

## 2015-06-23 NOTE — Telephone Encounter (Signed)
See note below and please advise, Thanks! 

## 2015-06-23 NOTE — Telephone Encounter (Signed)
Good i'll see you next time.

## 2015-06-23 NOTE — Telephone Encounter (Signed)
Patient stated that her insulin was changed to Lantus, just want to let you know  That her numbers are under 100dred, and she is happy with that.

## 2015-06-25 ENCOUNTER — Telehealth: Payer: Self-pay | Admitting: Endocrinology
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
## 2015-06-25 NOTE — Telephone Encounter (Signed)
See note below and please advise, Thanks! 

## 2015-06-25 NOTE — Telephone Encounter (Signed)
I discussed with on call nurse last night: For supper (06/24/15): take just 30 units of novolog at supper today only From now on, take just 30 units of novolog if exercise is upcoming.

## 2015-06-25 NOTE — Telephone Encounter (Signed)
Noted  

## 2015-07-16 ENCOUNTER — Other Ambulatory Visit: Payer: Self-pay | Admitting: Obstetrics and Gynecology

## 2015-07-17 LAB — CYTOLOGY - PAP

## 2015-07-21 ENCOUNTER — Ambulatory Visit (INDEPENDENT_AMBULATORY_CARE_PROVIDER_SITE_OTHER): Payer: 59 | Admitting: Ophthalmology

## 2015-07-21 DIAGNOSIS — H43813 Vitreous degeneration, bilateral: Secondary | ICD-10-CM

## 2015-07-21 DIAGNOSIS — I10 Essential (primary) hypertension: Secondary | ICD-10-CM

## 2015-07-21 DIAGNOSIS — H35033 Hypertensive retinopathy, bilateral: Secondary | ICD-10-CM | POA: Diagnosis not present

## 2015-07-21 DIAGNOSIS — E11319 Type 2 diabetes mellitus with unspecified diabetic retinopathy without macular edema: Secondary | ICD-10-CM | POA: Diagnosis not present

## 2015-07-21 DIAGNOSIS — H353121 Nonexudative age-related macular degeneration, left eye, early dry stage: Secondary | ICD-10-CM

## 2015-07-21 DIAGNOSIS — E113293 Type 2 diabetes mellitus with mild nonproliferative diabetic retinopathy without macular edema, bilateral: Secondary | ICD-10-CM | POA: Diagnosis not present

## 2015-07-21 LAB — HM DIABETES EYE EXAM

## 2015-07-23 ENCOUNTER — Encounter: Payer: Self-pay | Admitting: Internal Medicine

## 2015-07-24 DIAGNOSIS — E11319 Type 2 diabetes mellitus with unspecified diabetic retinopathy without macular edema: Secondary | ICD-10-CM | POA: Insufficient documentation

## 2015-08-18 ENCOUNTER — Ambulatory Visit: Payer: 59 | Admitting: Endocrinology

## 2015-08-27 ENCOUNTER — Other Ambulatory Visit: Payer: Self-pay | Admitting: *Deleted

## 2015-08-27 DIAGNOSIS — E1042 Type 1 diabetes mellitus with diabetic polyneuropathy: Secondary | ICD-10-CM

## 2015-08-27 MED ORDER — METFORMIN HCL ER (OSM) 500 MG PO TB24: 500.0000 mg | ORAL_TABLET | Freq: Every day | ORAL | Status: DC

## 2015-08-27 MED ORDER — INSULIN ASPART 100 UNIT/ML FLEXPEN: PEN_INJECTOR | SUBCUTANEOUS | Status: DC

## 2015-09-05 ENCOUNTER — Ambulatory Visit: Payer: 59 | Admitting: Internal Medicine

## 2015-09-09 ENCOUNTER — Ambulatory Visit: Payer: 59 | Admitting: Physician Assistant

## 2015-09-10 ENCOUNTER — Ambulatory Visit: Payer: 59 | Admitting: Internal Medicine

## 2015-09-17 ENCOUNTER — Telehealth: Payer: Self-pay | Admitting: Endocrinology

## 2015-09-17 MED ORDER — GLUCOSE BLOOD VI STRP: ORAL_STRIP | Status: DC

## 2015-09-17 NOTE — Telephone Encounter (Signed)
Patient stated that she went to check b/s and her glucose Freestyle meter is broken. Do we have one she can come and pick up. Please advise

## 2015-09-17 NOTE — Telephone Encounter (Signed)
Test strips sent to the [facility]. Pt notified via voiecmail

## 2015-09-17 NOTE — Telephone Encounter (Signed)
Pt advised we do have a freestyle meter for her to come pick up. Meter placed up front for the pt to pick up.

## 2015-09-17 NOTE — Telephone Encounter (Signed)
Patient stated that the test strips for this meter are different, she need  freestyle insulinx test srips [facility] [address]        She need some to tide her over until she get her prescription at least one box

## 2015-09-19 ENCOUNTER — Telehealth: Payer: Self-pay | Admitting: Endocrinology

## 2015-09-19 NOTE — Telephone Encounter (Signed)
Patient sees me for DM.  She is not disabled for this condition. Form is returned to basket. Med records can be obtained from med rec dept.

## 2015-09-22 ENCOUNTER — Encounter: Payer: Self-pay | Admitting: *Deleted

## 2015-09-22 NOTE — Telephone Encounter (Signed)
Renee Lam, We have searched for the forms. They must have already been sent to medical records.

## 2015-09-22 NOTE — Telephone Encounter (Signed)
Labs need to [facility] sent [facility] on [address]

## 2015-09-22 NOTE — Telephone Encounter (Signed)
Renee Lam, Could you look in Dr. Cordelia Pen scan basket and retreive the forms and place them on my desk. I will contact the pt tomorrow and advise of the note below when I return to the office, Thanks!

## 2015-09-22 NOTE — Telephone Encounter (Signed)
Printed lab orders and faxed them to [phone].

## 2015-09-22 NOTE — Telephone Encounter (Signed)
Larene Beach Could you print the lab orders from 05/19/2015 that Dr. Loanne Drilling placed on call the [facility] on [address] and get the fax and then send them? Thanks!

## 2015-09-22 NOTE — Telephone Encounter (Signed)
Ok. I will look into this once I return to the office on Tuesday.

## 2015-09-22 NOTE — Telephone Encounter (Signed)
Megan, please read message below.

## 2015-09-23 ENCOUNTER — Telehealth: Payer: Self-pay

## 2015-09-23 NOTE — Telephone Encounter (Signed)
Insurance company has been notified the disability paper work cannot be completed from a diabetes stand point. Insurance advised to contact medical records for any information,

## 2015-09-25 ENCOUNTER — Other Ambulatory Visit: Payer: Self-pay | Admitting: Endocrinology

## 2015-09-30 ENCOUNTER — Ambulatory Visit (INDEPENDENT_AMBULATORY_CARE_PROVIDER_SITE_OTHER): Payer: 59 | Admitting: Endocrinology

## 2015-09-30 ENCOUNTER — Encounter: Payer: Self-pay | Admitting: Endocrinology

## 2015-09-30 VITALS — BP 122/88 | HR 88 | Temp 98.6°F | Ht <= 58 in | Wt 177.0 lb

## 2015-09-30 DIAGNOSIS — E1042 Type 1 diabetes mellitus with diabetic polyneuropathy: Secondary | ICD-10-CM | POA: Diagnosis not present

## 2015-09-30 DIAGNOSIS — Z794 Long term (current) use of insulin: Secondary | ICD-10-CM | POA: Diagnosis not present

## 2015-09-30 DIAGNOSIS — E11319 Type 2 diabetes mellitus with unspecified diabetic retinopathy without macular edema: Secondary | ICD-10-CM

## 2015-09-30 LAB — HGB A1C W/O EAG: Hgb A1c MFr Bld: 7.5 % — ABNORMAL HIGH (ref 4.8–5.6)

## 2015-09-30 LAB — MICROALBUMIN / CREATININE URINE RATIO
Creatinine, Urine: 110.8 mg/dL
MICROALB/CREAT RATIO: 6 mg/g creat (ref 0.0–30.0)
Microalbumin, Urine: 6.7 ug/mL

## 2015-09-30 MED ORDER — INSULIN ASPART 100 UNIT/ML FLEXPEN: PEN_INJECTOR | SUBCUTANEOUS | Status: DC

## 2015-09-30 MED ORDER — INSULIN GLARGINE 100 UNIT/ML SOLOSTAR PEN: 50.0000 [IU] | PEN_INJECTOR | Freq: Every day | SUBCUTANEOUS | Status: DC

## 2015-09-30 NOTE — Patient Instructions (Addendum)
check your blood sugar twice a day.  vary the time of day when you check, between before the 3 meals, and at bedtime.  also check if you have symptoms of your blood sugar being too high or too low.  please keep a record of the readings and bring it to your next appointment here.  You can write it on any piece of paper.  please call us sooner if your blood sugar goes below 70, or if you have a lot of readings over 200.   If you get a better price on "Basaglar" than lantus, you should change--they are similar.   increase the novolog to 3 times a day (just before each meal) 40-55-55 units.   Please continue the same lantus.   Please come back for a follow-up appointment in 4 months.

## 2015-09-30 NOTE — Progress Notes (Signed)
Subjective:    Patient ID: Renee Lam, female    DOB: 10/15/1960, 55 y.o.   MRN: [id number]  HPI Pt returns for f/u of diabetes mellitus: DM type: Insulin-requiring type 2 Dx'ed: [id number] Complications: polyneuropathy and retinopathy.  Therapy: insulin since 2010 GDM: 1996 and 1999 DKA: never Severe hypoglycemia: never Pancreatitis: never Other: she takes multiple daily injections.   Interval history: She takes lantus 50 units qhs, and novolog 3 times a day (just before each meal) 35-55-55 units.  She seldom has hypoglycemia, and these episodes are mild.  no cbg record, but states cbg's are in the low-100's.  There is no trend throughout the day, except it is highest at lunch.  She is disabled, and exercise is poor.   Past Medical History  Diagnosis Date  . Asthma   . Diabetes mellitus   . Arthritis   . Chicken pox   . GERD (gastroesophageal reflux disease)   . Allergy   . Hypertension   . Hyperlipidemia   . Migraine   . Viral meningitis   . Pericarditis   . Finger infection 2008    isolation    Past Surgical History  Procedure Laterality Date  . Breast surgery  2002    breast bx  . Cesarean section      1997, 2000  . Knee arthroscopy  2010    right  . Cardiac catheterization  2012  . Orif humerus fracture Right 03/25/2015    Procedure: OPEN REDUCTION INTERNAL FIXATION (ORIF) DISTAL HUMERUS FRACTURE;  Surgeon: Renette Butters, MD;  Location: [facility];  Service: Orthopedics;  Laterality: Right;    Social History   Social History  . Marital Status: Divorced    Spouse Name: N/A  . Number of Children: N/A  . Years of Education: N/A   Occupational History  . Not on file.   Social History Main Topics  . Smoking status: Never Smoker   . Smokeless tobacco: Never Used  . Alcohol Use: Yes     Comment: once a year  . Drug Use: No  . Sexual Activity: Yes    Birth Control/ Protection: Post-menopausal   Other Topics Concern  . Not on file    Social History Narrative    Current Outpatient Prescriptions on File Prior to Visit  Medication Sig Dispense Refill  . albuterol (PROVENTIL HFA;VENTOLIN HFA) 108 (90 BASE) MCG/ACT inhaler Inhale 2 puffs into the lungs every 4 (four) hours as needed. For shortness of breath/wheezing 3 Inhaler 3  . aspirin 81 MG tablet Take 81 mg by mouth daily.     Marland Kitchen atorvastatin (LIPITOR) 80 MG tablet Take 1 tablet (80 mg total) by mouth at bedtime. 90 tablet 3  . Canagliflozin (INVOKANA) 300 MG TABS Take 1 tablet (300 mg total) by mouth daily. 30 tablet 6  . diphenhydrAMINE (BENADRYL) 25 MG tablet Take 25 mg by mouth every 6 (six) hours as needed.    . docusate sodium (COLACE) 100 MG capsule Take 1 capsule (100 mg total) by mouth 2 (two) times daily. 10 capsule 0  . EPINEPHrine 0.3 mg/0.3 mL IJ SOAJ injection Inject 0.3 mLs (0.3 mg total) into the muscle once. 1 Device 1  . glucose blood (FREESTYLE INSULINX TEST) test strip Use to check blood sugar 4 times per day. 120 each 2  . Insulin Pen Needle 32G X 4 MM MISC 1 Device by Other route 4 (four) times daily. 400 each 5  . Lancets (FREESTYLE) lancets     .  metformin (FORTAMET) 500 MG (OSM) 24 hr tablet Take 1 tablet (500 mg total) by mouth daily with breakfast. 90 tablet 0   No current facility-administered medications on file prior to visit.    Allergies  Allergen Reactions  . Food     Strawberries, mangoes, pineapple, eggplant, zucchini  . Penicillins Hives    Family History  Problem Relation Age of Onset  . Arthritis Other   . Cancer Other     colon,ovary/uterus,breast,prostate  . Hyperlipidemia Other   . Heart disease Other   . Hypertension Other   . Depression Other   . Colon cancer Neg Hx   . Stomach cancer Neg Hx   . Colon polyps Mother   . Diabetes Mother   . Heart failure Mother   . Diabetes Father   . Heart disease Father   . Arthritis Father   . Hypertension Father   . Heart failure Father     BP 122/88 mmHg  Pulse 88   Temp(Src) 98.6 F (37 C) (Oral)  Ht 4\' 10"  (1.473 m)  Wt 177 lb (80.287 kg)  BMI 37.00 kg/m2  SpO2 94%  Review of Systems Denies LOC    Objective:   Physical Exam VITAL SIGNS:  See vs page GENERAL: no distress Pulses: dorsalis pedis intact bilat.   MSK: no deformity of the feet CV: no leg edema Skin:  no ulcer on the feet.  normal color and temp on the feet. Neuro: sensation is intact to touch on the feet.     A1c=7.4%    Assessment & Plan:  DM: she needs increased rx, if it can be done with a regimen that avoids or minimizes hypoglycemia.   Patient is advised the following: Patient Instructions  check your blood sugar twice a day.  vary the time of day when you check, between before the 3 meals, and at bedtime.  also check if you have symptoms of your blood sugar being too high or too low.  please keep a record of the readings and bring it to your next appointment here.  You can write it on any piece of paper.  please call us sooner if your blood sugar goes below 70, or if you have a lot of readings over 200.   If you get a better price on "Basaglar" than lantus, you should change--they are similar.   increase the novolog to 3 times a day (just before each meal) 40-55-55 units.   Please continue the same lantus.   Please come back for a follow-up appointment in 4 months.

## 2015-10-01 DIAGNOSIS — E119 Type 2 diabetes mellitus without complications: Secondary | ICD-10-CM | POA: Insufficient documentation

## 2015-10-03 ENCOUNTER — Telehealth: Payer: Self-pay | Admitting: *Deleted

## 2015-10-03 NOTE — Telephone Encounter (Signed)
Spoke to pt, asked her if she is back at work yet since surgery? Pt said no. Told her the papers that Dr.K got to fill out should be filled out by your surgeon. Pt verbalized understanding and stated he does not have to fill them out. They were filled out by her surgeon and she checked with [employer] today and they have the papers. Told her okay then you do not need to see Dr.K till scheduled appt in June I will cancel appt. Pt verbalized understanding.

## 2015-10-03 NOTE — Telephone Encounter (Signed)
Left message on voicemail to call office.  

## 2015-10-15 ENCOUNTER — Other Ambulatory Visit: Payer: Self-pay | Admitting: Endocrinology

## 2015-10-17 ENCOUNTER — Ambulatory Visit: Payer: 59 | Admitting: Internal Medicine

## 2015-10-22 ENCOUNTER — Telehealth: Payer: Self-pay | Admitting: Internal Medicine

## 2015-10-22 NOTE — Telephone Encounter (Signed)
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  Pt has a fup on 6/23, but this is not a cpe. Ok to work pt in within this time frame, or ok to change the 6/23 appt to a cpe?

## 2015-10-23 NOTE — Telephone Encounter (Signed)
Renee Lam, if you can change 6/23 appt to CPE please do, if not work pt in. Thanks

## 2015-10-24 NOTE — Telephone Encounter (Signed)
Pt has been scheduled.  °

## 2015-10-27 DIAGNOSIS — G5601 Carpal tunnel syndrome, right upper limb: Secondary | ICD-10-CM | POA: Diagnosis not present

## 2015-10-31 DIAGNOSIS — G5601 Carpal tunnel syndrome, right upper limb: Secondary | ICD-10-CM | POA: Diagnosis not present

## 2015-11-14 ENCOUNTER — Ambulatory Visit: Payer: 59 | Admitting: Interventional Cardiology

## 2015-11-17 ENCOUNTER — Ambulatory Visit: Payer: Self-pay | Admitting: Interventional Cardiology

## 2015-11-25 ENCOUNTER — Encounter: Payer: Self-pay | Admitting: Internal Medicine

## 2015-11-25 ENCOUNTER — Ambulatory Visit (INDEPENDENT_AMBULATORY_CARE_PROVIDER_SITE_OTHER): Payer: BLUE CROSS/BLUE SHIELD | Admitting: Internal Medicine

## 2015-11-25 VITALS — BP 122/80 | HR 102 | Temp 98.9°F | Ht <= 58 in | Wt 180.0 lb

## 2015-11-25 DIAGNOSIS — E109 Type 1 diabetes mellitus without complications: Secondary | ICD-10-CM

## 2015-11-25 DIAGNOSIS — E1042 Type 1 diabetes mellitus with diabetic polyneuropathy: Secondary | ICD-10-CM

## 2015-11-25 DIAGNOSIS — J209 Acute bronchitis, unspecified: Secondary | ICD-10-CM | POA: Diagnosis not present

## 2015-11-25 DIAGNOSIS — J45909 Unspecified asthma, uncomplicated: Secondary | ICD-10-CM

## 2015-11-25 DIAGNOSIS — Z0000 Encounter for general adult medical examination without abnormal findings: Secondary | ICD-10-CM | POA: Diagnosis not present

## 2015-11-25 MED ORDER — GLUCOSE BLOOD VI STRP: ORAL_STRIP | Status: DC

## 2015-11-25 MED ORDER — INSULIN GLARGINE 100 UNIT/ML SOLOSTAR PEN: 50.0000 [IU] | PEN_INJECTOR | Freq: Every day | SUBCUTANEOUS | Status: DC

## 2015-11-25 MED ORDER — METFORMIN HCL ER (OSM) 500 MG PO TB24: ORAL_TABLET | ORAL | Status: DC

## 2015-11-25 MED ORDER — ATORVASTATIN CALCIUM 80 MG PO TABS: 80.0000 mg | ORAL_TABLET | Freq: Every day | ORAL | Status: DC

## 2015-11-25 MED ORDER — FREESTYLE LANCETS MISC: Status: DC

## 2015-11-25 MED ORDER — INSULIN PEN NEEDLE 32G X 4 MM MISC
1.0000 | Freq: Four times a day (QID) | Status: DC
Start: 2015-11-25 — End: 2016-04-29

## 2015-11-25 MED ORDER — ALBUTEROL SULFATE HFA 108 (90 BASE) MCG/ACT IN AERS
2.0000 | INHALATION_SPRAY | RESPIRATORY_TRACT | Status: DC | PRN
Start: 2015-11-25 — End: 2016-06-01

## 2015-11-25 MED ORDER — EPINEPHRINE 0.3 MG/0.3ML IJ SOAJ: 0.3000 mg | Freq: Once | INTRAMUSCULAR | Status: DC

## 2015-11-25 MED ORDER — INSULIN ASPART 100 UNIT/ML FLEXPEN: PEN_INJECTOR | SUBCUTANEOUS | Status: DC

## 2015-11-25 NOTE — Patient Instructions (Signed)
Follow-up endocrinology  Resume statin therapy  Return in 6 months for follow-up

## 2015-11-25 NOTE — Progress Notes (Signed)
Subjective:    Patient ID: Renee Lam, female    DOB: 09-04-1960, 55 y.o.   MRN: [id number]  HPI  55 year old patient who is seen today for a health maintenance exam. She has started nursing school and requires confirmation of her immunization status. She had a traumatic right humeral fracture in October of last year, treated by a persistent right radial neuropathy.  She has had recent follow-up nerve conduction studies.  She also states she has a right carpal tunnel syndrome.  Her right arm weakness has improved but she is no longer able to type and apparently she is disabled due to this injury. She is followed by endocrine for type 1 diabetes.  She has had a recent eye examination. She is scheduled to see cardiology in the fall.  She was hospitalized briefly in 2012 with chest pain.  Heart catheterization was normal at that time She has dyslipidemia but has been off statin therapy. She has a history of asthma, which has been stable  Colonoscopy 2014 Heart catheterization 2012  Past Medical History  Diagnosis Date  . Asthma   . Diabetes mellitus   . Arthritis   . Chicken pox   . GERD (gastroesophageal reflux disease)   . Allergy   . Hypertension   . Hyperlipidemia   . Migraine   . Viral meningitis   . Pericarditis   . Finger infection 2008    isolation     Social History   Social History  . Marital Status: Divorced    Spouse Name: N/A  . Number of Children: N/A  . Years of Education: N/A   Occupational History  . Not on file.   Social History Main Topics  . Smoking status: Never Smoker   . Smokeless tobacco: Never Used  . Alcohol Use: Yes     Comment: once a year  . Drug Use: No  . Sexual Activity: Yes    Birth Control/ Protection: Post-menopausal   Other Topics Concern  . Not on file   Social History Narrative    Past Surgical History  Procedure Laterality Date  . Breast surgery  2002    breast bx  . Cesarean section      1997, 2000  . Knee  arthroscopy  2010    right  . Cardiac catheterization  2012  . Orif humerus fracture Right 03/25/2015    Procedure: OPEN REDUCTION INTERNAL FIXATION (ORIF) DISTAL HUMERUS FRACTURE;  Surgeon: Renette Butters, MD;  Location: [place];  Service: Orthopedics;  Laterality: Right;    Family History  Problem Relation Age of Onset  . Arthritis Other   . Cancer Other     colon,ovary/uterus,breast,prostate  . Hyperlipidemia Other   . Heart disease Other   . Hypertension Other   . Depression Other   . Colon cancer Neg Hx   . Stomach cancer Neg Hx   . Colon polyps Mother   . Diabetes Mother   . Heart failure Mother   . Diabetes Father   . Heart disease Father   . Arthritis Father   . Hypertension Father   . Heart failure Father        Current Outpatient Prescriptions on File Prior to Visit  Medication Sig Dispense Refill  . aspirin 81 MG tablet Take 81 mg by mouth daily.     . Canagliflozin (INVOKANA) 300 MG TABS Take 1 tablet (300 mg total) by mouth daily. 30 tablet 6  . diphenhydrAMINE (BENADRYL) 25  MG tablet Take 25 mg by mouth every 6 (six) hours as needed.    . docusate sodium (COLACE) 100 MG capsule Take 1 capsule (100 mg total) by mouth 2 (two) times daily. 10 capsule 0  . insulin aspart (NOVOLOG FLEXPEN) 100 UNIT/ML FlexPen 3 times a day (just before each meal), 40-55-55 units 45 pen 0  . Insulin Glargine (LANTUS SOLOSTAR) 100 UNIT/ML Solostar Pen Inject 50 Units into the skin at bedtime. 15 pen PRN  . metformin (FORTAMET) 500 MG (OSM) 24 hr tablet Take 1 tablet by mouth  daily with breakfast 90 tablet 1   No current facility-administered medications on file prior to visit.    BP 122/80 mmHg  Pulse 102  Temp(Src) 98.9 F (37.2 C) (Oral)  Ht 4\' 10"  (1.473 m)  Wt 180 lb (81.647 kg)  BMI 37.63 kg/m2  SpO2 97%     Review of Systems  HENT: Negative for congestion, dental problem, hearing loss, rhinorrhea, sinus pressure, sore throat and tinnitus.     Eyes: Negative for pain, discharge and visual disturbance.  Respiratory: Negative for cough and shortness of breath.   Cardiovascular: Negative for chest pain, palpitations and leg swelling.  Gastrointestinal: Negative for nausea, vomiting, abdominal pain, diarrhea, constipation, blood in stool and abdominal distention.  Genitourinary: Negative for dysuria, urgency, frequency, hematuria, flank pain, vaginal bleeding, vaginal discharge, difficulty urinating, vaginal pain and pelvic pain.  Musculoskeletal: Negative for joint swelling, arthralgias and gait problem.  Skin: Negative for rash.  Neurological: Positive for weakness (Right arm weakness). Negative for dizziness, syncope, speech difficulty, numbness and headaches.  Hematological: Negative for adenopathy.  Psychiatric/Behavioral: Negative for behavioral problems, dysphoric mood and agitation. The patient is not nervous/anxious.        Objective:   Physical Exam  Constitutional: She is oriented to person, place, and time. She appears well-developed and well-nourished.  Overweight No distress normal.  Blood pressure   HENT:  Head: Normocephalic.  Right Ear: External ear normal.  Left Ear: External ear normal.  Mouth/Throat: Oropharynx is clear and moist.  Eyes: Conjunctivae and EOM are normal. Pupils are equal, round, and reactive to light.  Neck: Normal range of motion. Neck supple. No thyromegaly present.  Cardiovascular: Normal rate, regular rhythm, normal heart sounds and intact distal pulses.   Pulmonary/Chest: Effort normal and breath sounds normal.  Abdominal: Soft. Bowel sounds are normal. She exhibits no mass. There is no tenderness.  Musculoskeletal: Normal range of motion.  Lymphadenopathy:    She has no cervical adenopathy.  Neurological: She is alert and oriented to person, place, and time.  Skin: Skin is warm and dry. No rash noted.  Psychiatric: She has a normal mood and affect. Her behavior is normal.           Assessment & Plan:   Preventive health exam Type 1 diabetes.  Follow-up endocrine Dyslipidemia.  Resume statin therapy History mild diabetic retinopathy.  Follow-up ophthalmology History of right radial neuropathy, posttraumatic History of asthma  Will obtain the serology needed to confirm her immunization status Recheck 6 months or as needed All medications updated   [name], MD

## 2015-11-25 NOTE — Progress Notes (Signed)
Pre visit review using our clinic review tool, if applicable. No additional management support is needed unless otherwise documented below in the visit note. 

## 2015-12-01 ENCOUNTER — Telehealth: Payer: Self-pay | Admitting: Internal Medicine

## 2015-12-01 NOTE — Telephone Encounter (Signed)
following up on paperwork requested from appointment last Tuesday 6/13.  Pt states she had titers done and waiting on results to complete paperwork.

## 2015-12-01 NOTE — Telephone Encounter (Signed)
Pt called back, told her labs are all back except TB test and it should be back Friday. 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 Pt verbalized understanding and asked if she could come in tomorrow around 4:00 pm to have first injection. Told pt that is fine will put you on the schedule for nurse visit. Pt verbalized understanding. Appt scheduled.

## 2015-12-01 NOTE — Telephone Encounter (Signed)
Left message on voicemail to call office.  

## 2015-12-02 ENCOUNTER — Ambulatory Visit (INDEPENDENT_AMBULATORY_CARE_PROVIDER_SITE_OTHER): Payer: BLUE CROSS/BLUE SHIELD | Admitting: *Deleted

## 2015-12-02 DIAGNOSIS — Z23 Encounter for immunization: Secondary | ICD-10-CM

## 2015-12-04 ENCOUNTER — Telehealth: Payer: Self-pay | Admitting: *Deleted

## 2015-12-04 NOTE — Telephone Encounter (Signed)
Left detailed message on voicemail that paperwork is complete. Will be at the front desk for pickup.

## 2015-12-05 ENCOUNTER — Ambulatory Visit: Payer: 59 | Admitting: Internal Medicine

## 2015-12-09 ENCOUNTER — Encounter: Payer: Self-pay | Admitting: Internal Medicine

## 2015-12-09 ENCOUNTER — Ambulatory Visit (INDEPENDENT_AMBULATORY_CARE_PROVIDER_SITE_OTHER): Payer: BLUE CROSS/BLUE SHIELD | Admitting: Internal Medicine

## 2015-12-09 VITALS — BP 140/86 | HR 84 | Temp 98.3°F | Resp 20 | Ht <= 58 in | Wt 174.0 lb

## 2015-12-09 DIAGNOSIS — L918 Other hypertrophic disorders of the skin: Secondary | ICD-10-CM

## 2015-12-09 DIAGNOSIS — E7800 Pure hypercholesterolemia, unspecified: Secondary | ICD-10-CM

## 2015-12-09 NOTE — Progress Notes (Signed)
Pre visit review using our clinic review tool, if applicable. No additional management support is needed unless otherwise documented below in the visit note. 

## 2015-12-09 NOTE — Progress Notes (Signed)
   Subjective:    Patient ID: Renee Lam, female    DOB: 04-13-1961, 55 y.o.   MRN: [id number]  HPI  55 year old patient who was seen recently for a preventive health examination.  She is followed by nephrology for her diabetes.  She was seen today for possible skin tag removal.  She did have a lesion beneath her left breast area.  This became quite irritated recently and apparently largely resolved.  Lab Results  Component Value Date   HGBA1C 7.5* 09/25/2015     Review of Systems  Skin: Positive for wound.       Objective:   Physical Exam  Constitutional: She appears well-developed and well-nourished. No distress.  Skin:  Skin tag largely resolved with a tiny 1 mm residual lesion          Assessment & Plan:   Small skin tag left anterior chest wall beneath breast.  No treatment necessary Diabetes.  Follow-up endocrinology  [name], MD

## 2015-12-09 NOTE — Patient Instructions (Signed)
Call or return to clinic prn if these symptoms worsen or fail to improve as anticipated.

## 2015-12-19 DIAGNOSIS — G5601 Carpal tunnel syndrome, right upper limb: Secondary | ICD-10-CM | POA: Diagnosis not present

## 2015-12-19 DIAGNOSIS — G5602 Carpal tunnel syndrome, left upper limb: Secondary | ICD-10-CM | POA: Diagnosis not present

## 2015-12-23 ENCOUNTER — Telehealth: Payer: Self-pay | Admitting: *Deleted

## 2015-12-23 DIAGNOSIS — M25611 Stiffness of right shoulder, not elsewhere classified: Secondary | ICD-10-CM | POA: Diagnosis not present

## 2015-12-23 DIAGNOSIS — M25511 Pain in right shoulder: Secondary | ICD-10-CM | POA: Diagnosis not present

## 2015-12-23 DIAGNOSIS — S42421D Displaced comminuted supracondylar fracture without intercondylar fracture of right humerus, subsequent encounter for fracture with routine healing: Secondary | ICD-10-CM | POA: Diagnosis not present

## 2015-12-23 NOTE — Telephone Encounter (Signed)
Left message on voicemail to call office. Received fax that Freestyle Test strips not covered. Pt needs to check insurance to see what brand is covered.

## 2015-12-25 NOTE — Telephone Encounter (Signed)
Pt called back, told her received fax that Freestyle test strips are not covered under your insurance. Told her she needs to contact her insurance and find out what meter and test strips are covered and let us know so we can send correct meter and test strips to the pharmacy. Pt verbalized understanding.

## 2015-12-30 DIAGNOSIS — M25511 Pain in right shoulder: Secondary | ICD-10-CM | POA: Diagnosis not present

## 2015-12-30 DIAGNOSIS — M25611 Stiffness of right shoulder, not elsewhere classified: Secondary | ICD-10-CM | POA: Diagnosis not present

## 2015-12-30 DIAGNOSIS — S42421D Displaced comminuted supracondylar fracture without intercondylar fracture of right humerus, subsequent encounter for fracture with routine healing: Secondary | ICD-10-CM | POA: Diagnosis not present

## 2015-12-31 NOTE — Telephone Encounter (Signed)
Pt needs new  glucometer contour next and testing strips. Pt testing  4 times a day [facility]

## 2016-01-01 ENCOUNTER — Ambulatory Visit (INDEPENDENT_AMBULATORY_CARE_PROVIDER_SITE_OTHER): Payer: BLUE CROSS/BLUE SHIELD | Admitting: *Deleted

## 2016-01-01 ENCOUNTER — Ambulatory Visit: Payer: BLUE CROSS/BLUE SHIELD | Admitting: *Deleted

## 2016-01-01 DIAGNOSIS — Z23 Encounter for immunization: Secondary | ICD-10-CM | POA: Diagnosis not present

## 2016-01-01 MED ORDER — BAYER CONTOUR NEXT MONITOR W/DEVICE KIT
PACK | Status: DC
Start: 2016-01-01 — End: 2016-04-08

## 2016-01-01 MED ORDER — GLUCOSE BLOOD VI STRP: ORAL_STRIP | Status: DC

## 2016-01-01 NOTE — Telephone Encounter (Signed)
New Rx sent to pharmacy

## 2016-01-02 DIAGNOSIS — M25611 Stiffness of right shoulder, not elsewhere classified: Secondary | ICD-10-CM | POA: Diagnosis not present

## 2016-01-02 DIAGNOSIS — M25511 Pain in right shoulder: Secondary | ICD-10-CM | POA: Diagnosis not present

## 2016-01-02 DIAGNOSIS — S42421D Displaced comminuted supracondylar fracture without intercondylar fracture of right humerus, subsequent encounter for fracture with routine healing: Secondary | ICD-10-CM | POA: Diagnosis not present

## 2016-01-14 ENCOUNTER — Encounter (HOSPITAL_COMMUNITY): Payer: Self-pay | Admitting: Emergency Medicine

## 2016-01-14 ENCOUNTER — Emergency Department (HOSPITAL_COMMUNITY)
Admission: EM | Admit: 2016-01-14 | Discharge: 2016-01-14 | Disposition: A | Discharge status: Home / Self Care | Payer: BLUE CROSS/BLUE SHIELD | Attending: Emergency Medicine | Admitting: Emergency Medicine

## 2016-01-14 DIAGNOSIS — Z79899 Other long term (current) drug therapy: Secondary | ICD-10-CM | POA: Insufficient documentation

## 2016-01-14 DIAGNOSIS — J45909 Unspecified asthma, uncomplicated: Secondary | ICD-10-CM | POA: Insufficient documentation

## 2016-01-14 DIAGNOSIS — M25511 Pain in right shoulder: Secondary | ICD-10-CM | POA: Diagnosis not present

## 2016-01-14 DIAGNOSIS — I10 Essential (primary) hypertension: Secondary | ICD-10-CM | POA: Diagnosis not present

## 2016-01-14 DIAGNOSIS — J069 Acute upper respiratory infection, unspecified: Secondary | ICD-10-CM | POA: Diagnosis not present

## 2016-01-14 DIAGNOSIS — Z7984 Long term (current) use of oral hypoglycemic drugs: Secondary | ICD-10-CM | POA: Diagnosis not present

## 2016-01-14 DIAGNOSIS — R05 Cough: Secondary | ICD-10-CM | POA: Diagnosis not present

## 2016-01-14 DIAGNOSIS — E11319 Type 2 diabetes mellitus with unspecified diabetic retinopathy without macular edema: Secondary | ICD-10-CM | POA: Insufficient documentation

## 2016-01-14 DIAGNOSIS — Z794 Long term (current) use of insulin: Secondary | ICD-10-CM | POA: Diagnosis not present

## 2016-01-14 DIAGNOSIS — Z7982 Long term (current) use of aspirin: Secondary | ICD-10-CM | POA: Insufficient documentation

## 2016-01-14 LAB — RAPID STREP SCREEN (MED CTR MEBANE ONLY): Streptococcus, Group A Screen (Direct): NEGATIVE

## 2016-01-14 NOTE — Discharge Instructions (Signed)
You have been seen today for upper respiratory infection symptoms. Your strep test was negative. Your symptoms are consistent with a viral illness. Viruses do not require antibiotics. Treatment is symptomatic care. Drink plenty of fluids and get plenty of rest. You should be drinking at least a liter of water an hour to stay hydrated. Ibuprofen, Naproxen, or Tylenol for pain or fever. Plain Mucinex may help relieve congestion. Warm liquids or Chloraseptic spray may help soothe the sore throat. Follow up with PCP as needed. Return to ED should symptoms worsen.

## 2016-01-14 NOTE — ED Triage Notes (Signed)
Pt. reports sore throat with mild swelling onset his morning , denies fever or chills, respirations unlabored .

## 2016-01-14 NOTE — ED Provider Notes (Signed)
[facility] DEPT Provider Note   CSN: [id number] Arrival date & time: 01/14/16  5188  First Provider Contact:  First MD Initiated Contact with Patient 01/14/16 [phone]        History   Chief Complaint Chief Complaint  Patient presents with  . Sore Throat    HPI Renee Lam is a 55 y.o. female.  HPI   Renee Lam is a 55 y.o. female, with a history of Asthma and DM, presenting to the ED with a nonproductive cough and sore throat for the last three hours. Pain is mild to moderate, sharp, nonradiating. Patient also complains of bilateral ear congestion. Patient states that she isn't clinicals and needs a work excuse. Patient has not tried anything for her symptoms. Patient denies fever/chills, nausea/vomiting, difficulty breathing or swallowing, or any other complaints.   Past Medical History:  Diagnosis Date  . Allergy   . Arthritis   . Asthma   . Chicken pox   . Diabetes mellitus   . Finger infection 2008   isolation  . GERD (gastroesophageal reflux disease)   . Hyperlipidemia   . Hypertension   . Migraine   . Pericarditis   . Viral meningitis     Patient Active Problem List   Diagnosis Date Noted  . Diabetes ([place]) 10/01/2015  . Diabetic retinopathy ([place]) 07/24/2015  . Fracture, humerus 03/25/2015  . Acute bronchitis with asthma 05/31/2013  . Asthma 06/18/2011  . Hypercholesterolemia 06/18/2011    Past Surgical History:  Procedure Laterality Date  . BREAST SURGERY  2002   breast bx  . CARDIAC CATHETERIZATION  2012  . CESAREAN SECTION     1997, 2000  . KNEE ARTHROSCOPY  2010   right  . ORIF HUMERUS FRACTURE Right 03/25/2015   Procedure: OPEN REDUCTION INTERNAL FIXATION (ORIF) DISTAL HUMERUS FRACTURE;  Surgeon: Renette Butters, MD;  Location: [place];  Service: Orthopedics;  Laterality: Right;    OB History    No data available       Home Medications    Prior to Admission medications   Medication Sig Start Date End  Date Taking? Authorizing Provider  albuterol (PROVENTIL HFA;VENTOLIN HFA) 108 (90 Base) MCG/ACT inhaler Inhale 2 puffs into the lungs every 4 (four) hours as needed. For shortness of breath/wheezing 11/25/15 03/03/17  Marletta Lor, MD  aspirin 81 MG tablet Take 81 mg by mouth daily.     Historical Provider, MD  atorvastatin (LIPITOR) 80 MG tablet Take 1 tablet (80 mg total) by mouth at bedtime. 11/25/15 02/10/17  Marletta Lor, MD  Blood Glucose Monitoring Suppl (BAYER CONTOUR NEXT MONITOR) w/Device KIT Use as directed 01/01/16   Marletta Lor, MD  Canagliflozin [facility]) 300 MG TABS Take 1 tablet (300 mg total) by mouth daily. 02/01/14   Marletta Lor, MD  diphenhydrAMINE (BENADRYL) 25 MG tablet Take 25 mg by mouth every 6 (six) hours as needed.    Historical Provider, MD  docusate sodium (COLACE) 100 MG capsule Take 1 capsule (100 mg total) by mouth 2 (two) times daily. 03/25/15   Brittney Claiborne Billings, PA-C  EPINEPHrine 0.3 mg/0.3 mL IJ SOAJ injection Inject 0.3 mLs (0.3 mg total) into the muscle once. 11/25/15   Marletta Lor, MD  glucose blood (BAYER CONTOUR NEXT TEST) test strip Use to test four times a day as instructed. 01/01/16   Marletta Lor, MD  glucose blood (FREESTYLE INSULINX TEST) test strip USE TO CHECK BLOOD SUGAR FOUR TIMES  A DAY AND PRN.. 11/25/15   Marletta Lor, MD  insulin aspart (NOVOLOG FLEXPEN) 100 UNIT/ML FlexPen 3 times a day (just before each meal), 40-55-55 units 11/25/15   Marletta Lor, MD  Insulin Glargine (LANTUS SOLOSTAR) 100 UNIT/ML Solostar Pen Inject 50 Units into the skin at bedtime. 11/25/15   Marletta Lor, MD  Insulin Pen Needle 32G X 4 MM MISC 1 Device by Other route 4 (four) times daily. 11/25/15   Marletta Lor, MD  Lancets (FREESTYLE) lancets USE TO CHECK BLOOD SUGAR FOUR TIMES A DAY AND PRN 11/25/15   Marletta Lor, MD  metformin (FORTAMET) 500 MG (OSM) 24 hr tablet Take 1 tablet by mouth  daily with  breakfast 11/25/15   Marletta Lor, MD    Family History Family History  Problem Relation Age of Onset  . Colon polyps Mother   . Diabetes Mother   . Heart failure Mother   . Diabetes Father   . Heart disease Father   . Arthritis Father   . Hypertension Father   . Heart failure Father   . Arthritis Other   . Cancer Other     colon,ovary/uterus,breast,prostate  . Hyperlipidemia Other   . Heart disease Other   . Hypertension Other   . Depression Other   . Colon cancer Neg Hx   . Stomach cancer Neg Hx     Social History Social History  Substance Use Topics  . Smoking status: Never Smoker  . Smokeless tobacco: Never Used  . Alcohol use Yes     Comment: once a year     Allergies   Food; Penicillins; and Zithromax [azithromycin]   Review of Systems Review of Systems  Constitutional: Negative for chills and fever.  HENT: Positive for congestion and sore throat. Negative for drooling, facial swelling, trouble swallowing and voice change.   Respiratory: Positive for cough. Negative for shortness of breath.   Cardiovascular: Negative for chest pain.  Gastrointestinal: Negative for abdominal pain, nausea and vomiting.  Neurological: Negative for dizziness, light-headedness and headaches.  All other systems reviewed and are negative.    Physical Exam Updated Vital Signs BP 133/81 (BP Location: Right Arm)   Pulse 80   Temp 98 F (36.7 C) (Oral)   Resp 18   Ht 4' 10"  (1.473 m)   Wt 79.4 kg   SpO2 98%   BMI 36.58 kg/m   Physical Exam  Constitutional: She appears well-developed and well-nourished. No distress.  HENT:  Head: Normocephalic and atraumatic.  Right Ear: External ear and ear canal normal.  Left Ear: Tympanic membrane, external ear and ear canal normal.  Mouth/Throat: Uvula is midline and mucous membranes are normal. Posterior oropharyngeal edema and posterior oropharyngeal erythema present. No oropharyngeal exudate or tonsillar abscesses.  Very  mild right ear effusion.  Eyes: Conjunctivae are normal.  Neck: Neck supple.  Cardiovascular: Normal rate, regular rhythm, normal heart sounds and intact distal pulses.   Pulmonary/Chest: Effort normal and breath sounds normal. No respiratory distress.  Abdominal: Soft. There is no tenderness. There is no guarding.  Musculoskeletal: She exhibits no edema or tenderness.  Lymphadenopathy:    She has no cervical adenopathy.  Neurological: She is alert.  Skin: Skin is warm and dry. She is not diaphoretic.  Psychiatric: She has a normal mood and affect. Her behavior is normal.  Nursing note and vitals reviewed.    ED Treatments / Results  Labs (all labs ordered are listed, but only abnormal  results are displayed) Labs Reviewed  RAPID STREP SCREEN (NOT AT [facility])  CULTURE, GROUP A STREP [facility])    EKG  EKG Interpretation None       Radiology No results found.  Procedures Procedures (including critical care time)  Medications Ordered in ED Medications - No data to display   Initial Impression / Assessment and Plan / ED Course  I have reviewed the triage vital signs and the nursing notes.  Pertinent labs & imaging results that were available during my care of the patient were reviewed by me and considered in my medical decision making (see chart for details).  Clinical Course    Renee Lam presents with sore throat, cough, and congestion for the last 3 hours.  Patient's symptoms are consistent with a viral upper respiratory infection. Symptomatic care recommended. The patient was given instructions for home care as well as return precautions. Patient voices understanding of these instructions, accepts the plan, and is comfortable with discharge.  Vitals:   01/14/16 0406 01/14/16 0528  BP: 131/84 133/81  Pulse: 83 80  Resp: 16 18  Temp: 98.5 F (36.9 C) 98 F (36.7 C)  TempSrc: Oral Oral  SpO2: 99% 98%  Weight: 79.4 kg   Height: 4' 10"  (1.473 m)       Final Clinical Impressions(s) / ED Diagnoses   Final diagnoses:  URI (upper respiratory infection)    New Prescriptions Discharge Medication List as of 01/14/2016  5:24 AM       Lorayne Bender, PA-C 01/14/16 2128    Varney Biles, MD 01/17/16 4967

## 2016-01-17 LAB — CULTURE, GROUP A STREP (THRC)

## 2016-01-30 ENCOUNTER — Ambulatory Visit: Payer: 59 | Admitting: Endocrinology

## 2016-02-18 ENCOUNTER — Encounter: Payer: Self-pay | Admitting: Interventional Cardiology

## 2016-03-04 ENCOUNTER — Encounter: Payer: Self-pay | Admitting: Interventional Cardiology

## 2016-03-04 ENCOUNTER — Ambulatory Visit (INDEPENDENT_AMBULATORY_CARE_PROVIDER_SITE_OTHER): Payer: BLUE CROSS/BLUE SHIELD | Admitting: Interventional Cardiology

## 2016-03-04 ENCOUNTER — Encounter (INDEPENDENT_AMBULATORY_CARE_PROVIDER_SITE_OTHER): Payer: Self-pay

## 2016-03-04 VITALS — BP 146/60 | HR 95 | Ht <= 58 in | Wt 181.4 lb

## 2016-03-04 DIAGNOSIS — E7800 Pure hypercholesterolemia, unspecified: Secondary | ICD-10-CM

## 2016-03-04 DIAGNOSIS — J4520 Mild intermittent asthma, uncomplicated: Secondary | ICD-10-CM

## 2016-03-04 DIAGNOSIS — R0683 Snoring: Secondary | ICD-10-CM

## 2016-03-04 DIAGNOSIS — R002 Palpitations: Secondary | ICD-10-CM
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
## 2016-03-04 NOTE — Patient Instructions (Signed)
Medication Instructions:  Your physician recommends that you continue on your current medications as directed. Please refer to the Current Medication list given to you today.   Labwork: None ordered  Testing/Procedures: Your physician has recommended that you have a sleep study. This test records several body functions during sleep, including: brain activity, eye movement, oxygen and carbon dioxide blood levels, heart rate and rhythm, breathing rate and rhythm, the flow of air through your mouth and nose, snoring, body muscle movements, and chest and belly movement. Renee Lam will call you to scheduled)  Follow-Up: Your physician recommends that you schedule a follow-up appointment as needed   Any Other Special Instructions Will Be Listed Below (If Applicable).     If you need a refill on your cardiac medications before your next appointment, please call your pharmacy.

## 2016-03-05 ENCOUNTER — Ambulatory Visit (INDEPENDENT_AMBULATORY_CARE_PROVIDER_SITE_OTHER): Payer: BLUE CROSS/BLUE SHIELD | Admitting: Neurology

## 2016-03-05 ENCOUNTER — Ambulatory Visit: Payer: 59 | Admitting: Neurology

## 2016-03-05 DIAGNOSIS — Z0289 Encounter for other administrative examinations: Secondary | ICD-10-CM

## 2016-03-05 DIAGNOSIS — G5602 Carpal tunnel syndrome, left upper limb: Secondary | ICD-10-CM

## 2016-03-05 NOTE — Progress Notes (Signed)
EMG nerve conduction studies is under the procedure section
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
## 2016-03-12 ENCOUNTER — Telehealth: Payer: Self-pay | Admitting: Interventional Cardiology

## 2016-03-12 ENCOUNTER — Telehealth: Payer: Self-pay | Admitting: Neurology

## 2016-03-12 DIAGNOSIS — M25511 Pain in right shoulder: Secondary | ICD-10-CM | POA: Diagnosis not present

## 2016-03-12 NOTE — Telephone Encounter (Signed)
Pt states someone was mailing her last OV note to her late last week.  Advised pt our mail can take a week or more to get to her.  Advised if she didn't receive soon just to let us know.  Pt also states that a sleep study was ordered but she hasn't heard anything yet.  Advised pt it can take a little bit before we are able to schedule those d/t insurance.  Advised I will send message to [facility], our sleep CMA and have her follow up with her.  Pt verbalized understanding and was appreciative for assistance.

## 2016-03-12 NOTE — Telephone Encounter (Addendum)
Faxed NCV/EMG to [facility]

## 2016-03-12 NOTE — Telephone Encounter (Signed)
New message   Pt verbalized that she is calling to speak to the rn and that she needs a copy of the last ov report

## 2016-03-15 NOTE — Telephone Encounter (Signed)
Left detailed message on cell phone voicemail (per DPR on file).  Let patient know that her information would be sent over to the [facility] since her insurance does not require a prior-authorization.  I let her know that they will schedule her on the first available and they will mail her a packet of information for her study.  I let her know if she needed anything she could call back.

## 2016-03-24 DIAGNOSIS — M25511 Pain in right shoulder: Secondary | ICD-10-CM | POA: Diagnosis not present

## 2016-04-06 DIAGNOSIS — Z23 Encounter for immunization: Secondary | ICD-10-CM | POA: Diagnosis not present

## 2016-04-07 ENCOUNTER — Encounter (HOSPITAL_COMMUNITY): Payer: Self-pay | Admitting: Emergency Medicine

## 2016-04-07 ENCOUNTER — Ambulatory Visit (HOSPITAL_COMMUNITY)
Admission: EM | Admit: 2016-04-07 | Discharge: 2016-04-07 | Disposition: A | Discharge status: Home / Self Care | Payer: BLUE CROSS/BLUE SHIELD | Source: Home / Self Care | Attending: Emergency Medicine | Admitting: Emergency Medicine

## 2016-04-07 ENCOUNTER — Observation Stay (HOSPITAL_COMMUNITY)
Admission: EM | Admit: 2016-04-07 | Discharge: 2016-04-08 | Disposition: A | Discharge status: Home / Self Care | Payer: BLUE CROSS/BLUE SHIELD | Attending: Internal Medicine | Admitting: Internal Medicine

## 2016-04-07 ENCOUNTER — Emergency Department (HOSPITAL_COMMUNITY): Payer: BLUE CROSS/BLUE SHIELD

## 2016-04-07 DIAGNOSIS — R079 Chest pain, unspecified: Secondary | ICD-10-CM

## 2016-04-07 DIAGNOSIS — I10 Essential (primary) hypertension: Secondary | ICD-10-CM | POA: Insufficient documentation

## 2016-04-07 DIAGNOSIS — E7800 Pure hypercholesterolemia, unspecified: Secondary | ICD-10-CM | POA: Diagnosis present

## 2016-04-07 DIAGNOSIS — Z794 Long term (current) use of insulin: Secondary | ICD-10-CM | POA: Diagnosis not present

## 2016-04-07 DIAGNOSIS — E11319 Type 2 diabetes mellitus with unspecified diabetic retinopathy without macular edema: Secondary | ICD-10-CM | POA: Diagnosis not present

## 2016-04-07 DIAGNOSIS — J45909 Unspecified asthma, uncomplicated: Secondary | ICD-10-CM | POA: Insufficient documentation

## 2016-04-07 DIAGNOSIS — Z79899 Other long term (current) drug therapy: Secondary | ICD-10-CM | POA: Diagnosis not present

## 2016-04-07 DIAGNOSIS — R0600 Dyspnea, unspecified: Secondary | ICD-10-CM

## 2016-04-07 DIAGNOSIS — R072 Precordial pain: Principal | ICD-10-CM | POA: Insufficient documentation

## 2016-04-07 DIAGNOSIS — R0789 Other chest pain: Secondary | ICD-10-CM | POA: Diagnosis not present

## 2016-04-07 DIAGNOSIS — E119 Type 2 diabetes mellitus without complications: Secondary | ICD-10-CM

## 2016-04-07 LAB — BASIC METABOLIC PANEL
Anion gap: 11 (ref 5–15)
BUN: 9 mg/dL (ref 6–20)
CO2: 24 mmol/L (ref 22–32)
Calcium: 9.2 mg/dL (ref 8.9–10.3)
Chloride: 101 mmol/L (ref 101–111)
Creatinine, Ser: 0.56 mg/dL (ref 0.44–1.00)
GFR calc Af Amer: >60 mL/min (ref >60)
GFR calc non Af Amer: >60 mL/min (ref >60)
Glucose, Bld: 204 mg/dL — ABNORMAL HIGH (ref 65–99)
Potassium: 3.7 mmol/L (ref 3.5–5.1)
Sodium: 136 mmol/L (ref 135–145)

## 2016-04-07 LAB — CBC
HCT: 41.7 % (ref 36.0–46.0)
Hemoglobin: 14.3 g/dL (ref 12.0–15.0)
MCH: 28.9 pg (ref 26.0–34.0)
MCHC: 34.3 g/dL (ref 30.0–36.0)
MCV: 84.4 fL (ref 78.0–100.0)
Platelets: 432 10*3/uL — ABNORMAL HIGH (ref 150–400)
RBC: 4.94 MIL/uL (ref 3.87–5.11)
RDW: 13.2 % (ref 11.5–15.5)
WBC: 9.4 10*3/uL (ref 4.0–10.5)

## 2016-04-07 LAB — I-STAT TROPONIN, ED: Troponin i, poc: 0 ng/mL (ref 0.00–0.08)

## 2016-04-07 MED ORDER — ASPIRIN 81 MG PO CHEW
CHEWABLE_TABLET | ORAL | Status: AC
  Filled 2016-04-07: qty 4

## 2016-04-07 MED ORDER — ASPIRIN 81 MG PO CHEW
324.0000 mg | CHEWABLE_TABLET | Freq: Once | ORAL | Status: AC
  Administered 2016-04-07: 324 mg via ORAL

## 2016-04-07 MED ORDER — ASPIRIN 81 MG PO CHEW
CHEWABLE_TABLET | ORAL | Status: AC
  Filled 2016-04-07: qty 1

## 2016-04-07 NOTE — ED Notes (Signed)
Patient reports chest pain that started today.  Pain is constant, "like someone sitting on chest".  Patient has had episodes of nausea, no vomiting.  Patient often has hot flashes and today has been no different.

## 2016-04-07 NOTE — ED Provider Notes (Signed)
[facility] DEPT Provider Note   CSN: [id number] Arrival date & time: 04/07/16  1930  By signing my name below, I, Gwenlyn Fudge, attest that this documentation has been prepared under the direction and in the presence of Ripley Fraise, MD. Electronically Signed: Gwenlyn Fudge, ED Scribe. 04/07/16. 12:02 AM.   History   Chief Complaint Chief Complaint  Patient presents with  . Chest Pain  . Shortness of Breath   Pt has taken 1 breathing treatment PTA. Pt was seen at [facility] and was sent to ED to make sure it was not heart-related. She received Aspirin in UC.  Pt has been feeling chest pressure and SOB all day. Chest pressure feels like someone is sitting on her chest. Chest pressure radiates to back. She thought it was her asthma, but lacked the usual cough she has. Pressure is exacerbated with exertion. Shortness of breath is exacerbated with exertion. Pt reports associated nausea. Denies fevers, vomiting, coughing up blood, leg swelling, abdominal pain, cough. No hx of PE/DVT or stroke. Pt reports having recurrent heart palpitations after waking from a bad dream a few weeks ago. Has seen her Cardiologist about this recurring problem.  Normal cardiac cath in 2012 (Permed due to failed stress test). Has hx of Pericarditis, HLD and DM. Father had CABG x3 before 75. Both parents had hx of CHF.   The history is provided by the patient. No language interpreter was used.  Chest Pain   This is a new problem. The current episode started 12 to 24 hours ago. The problem occurs constantly. The problem has not changed since onset.The pain is present in the substernal region. The pain is at a severity of 4/10. The quality of the pain is described as pressure-like. The pain radiates to the upper back. The symptoms are aggravated by exertion. Associated symptoms include nausea and shortness of breath. Pertinent negatives include no abdominal pain, no cough, no fever, no headaches and no vomiting.  Her past  medical history is significant for diabetes and hyperlipidemia.  Pertinent negatives for past medical history include no DVT and no PE.  Her family medical history is significant for CAD, diabetes and hyperlipidemia.  Procedure history is positive for cardiac catheterization.  Shortness of Breath  This is a new problem. The current episode started 12 to 24 hours ago. The problem has not changed since onset.Associated symptoms include chest pain. Pertinent negatives include no fever, no headaches, no cough, no vomiting, no abdominal pain and no leg swelling. Associated medical issues include asthma. Associated medical issues do not include PE or DVT.    Past Medical History:  Diagnosis Date  . Allergy   . Arthritis   . Asthma   . Chicken pox   . Diabetes mellitus   . Finger infection 2008   isolation  . GERD (gastroesophageal reflux disease)   . Hyperlipidemia   . Hypertension   . Migraine   . Pericarditis   . Viral meningitis     Patient Active Problem List   Diagnosis Date Noted  . Morbid obesity ([place]) 03/04/2016  . Diabetes ([place]) 10/01/2015  . Diabetic retinopathy ([facility] [facility]) 07/24/2015  . Fracture, humerus 03/25/2015  . Acute bronchitis with asthma 05/31/2013  . Asthma 06/18/2011  . Hypercholesterolemia 06/18/2011    Past Surgical History:  Procedure Laterality Date  . BREAST SURGERY  2002   breast bx  . CARDIAC CATHETERIZATION  2012  . CESAREAN SECTION     1997, 2000  . KNEE ARTHROSCOPY  2010  right  . ORIF HUMERUS FRACTURE Right 03/25/2015   Procedure: OPEN REDUCTION INTERNAL FIXATION (ORIF) DISTAL HUMERUS FRACTURE;  Surgeon: Renette Butters, MD;  Location: [facility];  Service: Orthopedics;  Laterality: Right;    OB History    No data available       Home Medications    Prior to Admission medications   Medication Sig Start Date End Date Taking? Authorizing Provider  albuterol (PROVENTIL HFA;VENTOLIN HFA) 108 (90 Base) MCG/ACT inhaler Inhale  2 puffs into the lungs every 4 (four) hours as needed. For shortness of breath/wheezing 11/25/15 03/03/17 Yes Marletta Lor, MD  atorvastatin (LIPITOR) 80 MG tablet Take 1 tablet (80 mg total) by mouth at bedtime. 11/25/15 02/10/17 Yes Marletta Lor, MD  Canagliflozin (INVOKANA) 300 MG TABS Take 1 tablet (300 mg total) by mouth daily. 02/01/14  Yes Marletta Lor, MD  diphenhydrAMINE (BENADRYL) 25 MG tablet Take 25 mg by mouth every 6 (six) hours as needed for allergies.    Yes Historical Provider, MD  insulin aspart (NOVOLOG FLEXPEN) 100 UNIT/ML FlexPen Inject 40 units into the skin daily before breakfast. Inject 55 units into the skin daily before lunch and dinner.   Yes Historical Provider, MD  Insulin Glargine (LANTUS SOLOSTAR) 100 UNIT/ML Solostar Pen Inject 50 Units into the skin at bedtime. 11/25/15  Yes Marletta Lor, MD  Blood Glucose Monitoring Suppl (BAYER CONTOUR NEXT MONITOR) w/Device KIT Use as directed 01/01/16   Marletta Lor, MD  EPINEPHrine 0.3 mg/0.3 mL IJ SOAJ injection Inject 0.3 mLs (0.3 mg total) into the muscle once. 11/25/15   Marletta Lor, MD  glucose blood (BAYER CONTOUR NEXT TEST) test strip Use to test four times a day as instructed. 01/01/16   Marletta Lor, MD  glucose blood (FREESTYLE INSULINX TEST) test strip USE TO CHECK BLOOD SUGAR FOUR TIMES A DAY AND PRN.. 11/25/15   Marletta Lor, MD  Insulin Pen Needle 32G X 4 MM MISC 1 Device by Other route 4 (four) times daily. 11/25/15   Marletta Lor, MD  Lancets (FREESTYLE) lancets USE TO CHECK BLOOD SUGAR FOUR TIMES A DAY AND PRN 11/25/15   Marletta Lor, MD    Family History Family History  Problem Relation Age of Onset  . Colon polyps Mother   . Diabetes Mother   . Heart failure Mother   . Diabetes Father   . Heart disease Father   . Arthritis Father   . Hypertension Father   . Heart failure Father   . Arthritis Other   . Cancer Other      colon,ovary/uterus,breast,prostate  . Hyperlipidemia Other   . Heart disease Other   . Hypertension Other   . Depression Other   . Colon cancer Neg Hx   . Stomach cancer Neg Hx     Social History Social History  Substance Use Topics  . Smoking status: Never Smoker  . Smokeless tobacco: Never Used  . Alcohol use Yes     Comment: once a year    Allergies   Food; Penicillins; and Zithromax [azithromycin]  Review of Systems Review of Systems  Constitutional: Negative for fever.  Respiratory: Positive for shortness of breath. Negative for cough.   Cardiovascular: Positive for chest pain. Negative for leg swelling.  Gastrointestinal: Positive for nausea. Negative for abdominal pain and vomiting.  Neurological: Negative for headaches.  All other systems reviewed and are negative.  Physical Exam Updated Vital Signs BP 144/77 (  BP Location: Left Arm)   Pulse 79   Temp 98.3 F (36.8 C) (Oral)   Resp 19   SpO2 96%   Physical Exam  Nursing note and vitals reviewed. CONSTITUTIONAL: Well developed/well nourished HEAD: Normocephalic/atraumatic EYES: EOMI/PERRL ENMT: Mucous membranes moist NECK: supple no meningeal signs SPINE/BACK:entire spine nontender CV: S1/S2 noted, no murmurs/rubs/gallops noted LUNGS: Lungs are clear to auscultation bilaterally, no apparent distress ABDOMEN: soft, nontender, no rebound or guarding, bowel sounds noted throughout abdomen GU:no cva tenderness NEURO: Pt is awake/alert/appropriate, moves all extremitiesx4.  No facial droop.   EXTREMITIES: pulses normal/equal, full ROM SKIN: warm, color normal PSYCH: no abnormalities of mood noted, alert and oriented to situation   ED Treatments / Results  DIAGNOSTIC STUDIES: Oxygen Saturation is 96% on RA, adequate by my interpretation.    COORDINATION OF CARE: 12:03 AM Discussed treatment plan with pt at bedside which includes Nitroglycerin and Tylenol and pt agreed to plan.  Labs (all labs ordered  are listed, but only abnormal results are displayed) Labs Reviewed  BASIC METABOLIC PANEL - Abnormal; Notable for the following:       Result Value   Glucose, Bld 204 (*)    All other components within normal limits  CBC - Abnormal; Notable for the following:    Platelets 432 (*)    All other components within normal limits  I-STAT TROPOININ, ED  I-STAT TROPOININ, ED    EKG  EKG Interpretation  Date/Time:  Thursday April 08 2016 00:23:52 EDT Ventricular Rate:  78 PR Interval:    QRS Duration: 87 QT Interval:  377 QTC Calculation: 430 R Axis:   78 Text Interpretation:  Sinus rhythm LAE, consider biatrial enlargement Nonspecific repol abnormality, inferior leads No significant change since last tracing Confirmed by Christy Gentles  MD, [place] ([id number]) on 04/08/2016 12:27:25 AM       Radiology Dg Chest 2 View  Result Date: 04/07/2016 CLINICAL DATA:  Central chest pain radiating to back.  Dyspnea. EXAM: CHEST  2 VIEW COMPARISON:  03/20/2015 FINDINGS: The heart size and mediastinal contours are within normal limits. Both lungs are clear. Slightly low lung volumes with crowding of interstitial lung markings similar to prior study. There is minimal scarring at the left lung base. The The visualized skeletal structures are unremarkable. IMPRESSION: No active cardiopulmonary disease. Electronically Signed   By: Ashley Royalty M.D.   On: 04/07/2016 20:54    Procedures Procedures (including critical care time)  Medications Ordered in ED Medications  nitroGLYCERIN (NITROSTAT) SL tablet 0.4 mg (0.4 mg Sublingual Given 04/08/16 0044)  acetaminophen (TYLENOL) tablet 650 mg (650 mg Oral Given 04/08/16 0030)     Initial Impression / Assessment and Plan / ED Course  I have reviewed the triage vital signs and the nursing notes.  Pertinent labs & imaging results that were available during my care of the patient were reviewed by me and considered in my medical decision making (see chart for  details).  Clinical Course   I personally performed the services described in this documentation, which was scribed in my presence. The recorded information has been reviewed and is accurate.     Pt with HEART score >3 She had exertional CP that is now improved with NTG Even though she had negative cath 5 yrs ago, her risk factors/history are very suggestive of CAD Pt agreeable D/w dr Hal Hope for admit   Final Clinical Impressions(s) / ED Diagnoses   Final diagnoses:  Chest pain, rule out acute myocardial infarction  New Prescriptions New Prescriptions   No medications on file     Ripley Fraise, MD 04/08/16 [phone]

## 2016-04-07 NOTE — Discharge Instructions (Signed)
Go to the ER right now. We need to make sure that this is not your heart. Let them know if your chest pain changes, gets worse, or for any other concerns.

## 2016-04-07 NOTE — ED Triage Notes (Signed)
Pt presents to ED with complaints of chest pain radiating to her back and SOB. Pt states she associated her chest pain with asthma and has taken one breathing treatment prior to arrival.

## 2016-04-07 NOTE — ED Provider Notes (Signed)
HPI  SUBJECTIVE:  Renee Lam is a 55 y.o. female who presents with substernal chest pain described as pressure "as if something is sitting on my chest". She states it radiates to her back. Does not radiate up her neck or down her arm. She states that is constant, started around 10:00 this morning. She reports shortness of breath. She reports an episode of dizziness described as lightheadedness lasting for approximately 2 minutes earlier today. She reports nausea. Symptoms are worse with exertion, better with rest and leaning forward. She tried 2 puffs of her inhaler which did not help. She denies diaphoresis, wheezing. She is not been around any of her usual asthma triggers. No upper respiratory like symptoms. No vomiting. No fevers, coughing. No palpitations, presyncope, syncope. No abdominal pain. No lower extremity edema, calf pain, swelling. No surgery in the past 4 weeks, hemoptysis, exogenous estrogen. No unintentional weight loss, PND, orthopnea, nocturia. No belching, water brash or burning chest pain. She states that she had symptoms like this before and this was attributed to her asthma, but she states that she had coughing with that chest tightness. She is not not having coughing today. She has a past medical history of asthma, GERD, diabetes type 2. States that her glucose has been running within normal limits for her. Also a history of hypercholesterolemia, pericarditis. No history of DVT, PE, cancer, CHF. She is not a smoker. She has a history of abnormal stress test, status post a cardiac catheterization in 2012, which she states was "fine". Family history significant for father with MI's starting in his 40s. PMD: Dr. Krukowski. Cardiology. Dr. Henry Smith.    Past Medical History:  Diagnosis Date  . Allergy   . Arthritis   . Asthma   . Chicken pox   . Diabetes mellitus   . Finger infection 2008   isolation  . GERD (gastroesophageal reflux disease)   . Hyperlipidemia   .  Hypertension   . Migraine   . Pericarditis   . Viral meningitis     Past Surgical History:  Procedure Laterality Date  . BREAST SURGERY  2002   breast bx  . CARDIAC CATHETERIZATION  2012  . CESAREAN SECTION     1997, 2000  . KNEE ARTHROSCOPY  2010   right  . ORIF HUMERUS FRACTURE Right 03/25/2015   Procedure: OPEN REDUCTION INTERNAL FIXATION (ORIF) DISTAL HUMERUS FRACTURE;  Surgeon: Timothy D Murphy, MD;  Location: [facility];  Service: Orthopedics;  Laterality: Right;    Family History  Problem Relation Age of Onset  . Colon polyps Mother   . Diabetes Mother   . Heart failure Mother   . Diabetes Father   . Heart disease Father   . Arthritis Father   . Hypertension Father   . Heart failure Father   . Arthritis Other   . Cancer Other     colon,ovary/uterus,breast,prostate  . Hyperlipidemia Other   . Heart disease Other   . Hypertension Other   . Depression Other   . Colon cancer Neg Hx   . Stomach cancer Neg Hx     Social History  Substance Use Topics  . Smoking status: Never Smoker  . Smokeless tobacco: Never Used  . Alcohol use Yes     Comment: once a year    No current facility-administered medications for this encounter.   Current Outpatient Prescriptions:  .  albuterol (PROVENTIL HFA;VENTOLIN HFA) 108 (90 Base) MCG/ACT inhaler, Inhale 2 puffs into the lungs   every 4 (four) hours as needed. For shortness of breath/wheezing, Disp: 1 Inhaler, Rfl: 12 .  atorvastatin (LIPITOR) 80 MG tablet, Take 1 tablet (80 mg total) by mouth at bedtime., Disp: 90 tablet, Rfl: 3 .  Blood Glucose Monitoring Suppl (BAYER CONTOUR NEXT MONITOR) w/Device KIT, Use as directed, Disp: 1 kit, Rfl: 0 .  Canagliflozin (INVOKANA) 300 MG TABS, Take 1 tablet (300 mg total) by mouth daily., Disp: 30 tablet, Rfl: 6 .  diphenhydrAMINE (BENADRYL) 25 MG tablet, Take 25 mg by mouth every 6 (six) hours as needed for allergies. , Disp: , Rfl:  .  EPINEPHrine 0.3 mg/0.3 mL IJ SOAJ  injection, Inject 0.3 mLs (0.3 mg total) into the muscle once., Disp: 1 Device, Rfl: 1 .  glucose blood (BAYER CONTOUR NEXT TEST) test strip, Use to test four times a day as instructed., Disp: 100 each, Rfl: 12 .  glucose blood (FREESTYLE INSULINX TEST) test strip, USE TO CHECK BLOOD SUGAR FOUR TIMES A DAY AND PRN.., Disp: 120 each, Rfl: 12 .  insulin aspart (NOVOLOG FLEXPEN) 100 UNIT/ML FlexPen, Inject 40 units into the skin daily before breakfast. Inject 55 units into the skin daily before lunch and dinner., Disp: , Rfl:  .  Insulin Glargine (LANTUS SOLOSTAR) 100 UNIT/ML Solostar Pen, Inject 50 Units into the skin at bedtime., Disp: 20 pen, Rfl: 3 .  Insulin Pen Needle 32G X 4 MM MISC, 1 Device by Other route 4 (four) times daily., Disp: 400 each, Rfl: 12 .  Lancets (FREESTYLE) lancets, USE TO CHECK BLOOD SUGAR FOUR TIMES A DAY AND PRN, Disp: 100 each, Rfl: 12  Allergies: PCN   ROS  As noted in HPI.   Physical Exam  BP 139/74 (BP Location: Left Arm)   Pulse 85   Temp 98.5 F (36.9 C) (Oral)   Resp 16   SpO2 98%   Constitutional: Well developed, well nourished, no acute distress Eyes: PERRL, EOMI, conjunctiva normal bilaterally HENT: Normocephalic, atraumatic,mucus membranes moist Respiratory: Clear to auscultation bilaterally, no rales, no wheezing, no rhonchi or no chest wall tenderness Cardiovascular: Normal rate and rhythm, no murmurs, no gallops, no rubs GI: Soft, nondistended, normal bowel sounds, nontender, no rebound, no guarding skin: No rash, skin intact Musculoskeletal: Calves symmetric, nontender No edema, no tenderness, no deformities Neurologic: Alert & oriented x 3, CN II-XII grossly intact, no motor deficits, sensation grossly intact Psychiatric: Speech and behavior appropriate   ED Course   Medications  aspirin chewable tablet 324 mg (324 mg Oral Given 04/07/16 1920)    Orders Placed This Encounter  Procedures  . EKG 12-Lead    Standing Status:    Standing    Number of Occurrences:   1  . ED EKG    Standing Status:   Standing    Number of Occurrences:   1    Order Specific Question:   Reason for Exam    Answer:   Chest Pain   No results found for this or any previous visit (from the past 24 hour(s)). No results found.  ED Clinical Impression  Chest pain, unspecified type  Dyspnea, unspecified type   ED Assessment/Plan  EKG: Normal sinus rhythm, rate 87. Normal axis. Normal intervals. No ventricular hypertrophy. No ST elevation. T-wave inversion in 3, seen on previous EKG from 03/2015.  Presentation concerning for cardiac cause of her symptoms. She has multiple risk factors including diabetes, hypercholesterolemia, and a significant family history. This may be GERD or asthma, however, she denies any   GERD symptoms and the shortness of breath and chest pressure did not respond to inhaler, which it has done before. She has no ischemic changes on her EKG. Her vitals are normal. Gave 324 mg of aspirin. Feel that the patient is stable to go to the ED for a comprehensive of workup via shuttle   discussed rationale for transfer with patient. She agrees with plan.   Meds ordered this encounter  Medications  . aspirin chewable tablet 324 mg    *This clinic note was created using [employer]. Therefore, there may be occasional mistakes despite careful proofreading.  ?    Melynda Ripple, MD 04/07/16 1925

## 2016-04-07 NOTE — ED Notes (Signed)
ekg handed to dr Alphonzo Cruise

## 2016-04-07 NOTE — ED Triage Notes (Signed)
The patient presented to the [facility] with a complaint of chest pain and pressure as well as pain when breathing that started today. The patient stated that she was in class when the pain started. The patient stated that its a constant pain that doesn't change.

## 2016-04-08 ENCOUNTER — Encounter (HOSPITAL_COMMUNITY): Payer: Self-pay | Admitting: *Deleted

## 2016-04-08 ENCOUNTER — Observation Stay (HOSPITAL_BASED_OUTPATIENT_CLINIC_OR_DEPARTMENT_OTHER): Payer: BLUE CROSS/BLUE SHIELD

## 2016-04-08 DIAGNOSIS — R002 Palpitations: Secondary | ICD-10-CM | POA: Diagnosis not present

## 2016-04-08 DIAGNOSIS — R079 Chest pain, unspecified: Secondary | ICD-10-CM

## 2016-04-08 DIAGNOSIS — E7800 Pure hypercholesterolemia, unspecified: Secondary | ICD-10-CM

## 2016-04-08 LAB — CBC WITH DIFFERENTIAL/PLATELET
Basophils Absolute: 0.1 10*3/uL (ref 0.0–0.1)
Basophils Relative: 1 %
Eosinophils Absolute: 0.3 10*3/uL (ref 0.0–0.7)
Eosinophils Relative: 3 %
HCT: 39 % (ref 36.0–46.0)
Hemoglobin: 13.2 g/dL (ref 12.0–15.0)
Lymphocytes Relative: 45 %
Lymphs Abs: 3.6 10*3/uL (ref 0.7–4.0)
MCH: 28.8 pg (ref 26.0–34.0)
MCHC: 33.8 g/dL (ref 30.0–36.0)
MCV: 85 fL (ref 78.0–100.0)
Monocytes Absolute: 0.6 10*3/uL (ref 0.1–1.0)
Monocytes Relative: 7 %
Neutro Abs: 3.6 10*3/uL (ref 1.7–7.7)
Neutrophils Relative %: 44 %
Platelets: 390 10*3/uL (ref 150–400)
RBC: 4.59 MIL/uL (ref 3.87–5.11)
RDW: 13.2 % (ref 11.5–15.5)
WBC: 8.1 10*3/uL (ref 4.0–10.5)

## 2016-04-08 LAB — TROPONIN I
Troponin I: <0.03 ng/mL (ref <0.03)
Troponin I: <0.03 ng/mL (ref <0.03)

## 2016-04-08 LAB — COMPREHENSIVE METABOLIC PANEL
ALT: 22 U/L (ref 14–54)
AST: 23 U/L (ref 15–41)
Albumin: 3.4 g/dL — ABNORMAL LOW (ref 3.5–5.0)
Alkaline Phosphatase: 67 U/L (ref 38–126)
Anion gap: 8 (ref 5–15)
BUN: 8 mg/dL (ref 6–20)
CO2: 28 mmol/L (ref 22–32)
Calcium: 8.6 mg/dL — ABNORMAL LOW (ref 8.9–10.3)
Chloride: 102 mmol/L (ref 101–111)
Creatinine, Ser: 0.57 mg/dL (ref 0.44–1.00)
GFR calc Af Amer: >60 mL/min (ref >60)
GFR calc non Af Amer: >60 mL/min (ref >60)
Glucose, Bld: 183 mg/dL — ABNORMAL HIGH (ref 65–99)
Potassium: 3.3 mmol/L — ABNORMAL LOW (ref 3.5–5.1)
Sodium: 138 mmol/L (ref 135–145)
Total Bilirubin: 0.9 mg/dL (ref 0.3–1.2)
Total Protein: 6.7 g/dL (ref 6.5–8.1)

## 2016-04-08 LAB — TSH: TSH: 0.775 u[IU]/mL (ref 0.350–4.500)

## 2016-04-08 LAB — ECHOCARDIOGRAM COMPLETE
E decel time: 225 msec
E/e' ratio: 11.41
FS: 34 % (ref 28–44)
Height: 58 in
IVS/LV PW RATIO, ED: 0.93
LA ID, A-P, ES: 35 mm
LA diam end sys: 35 mm
LA diam index: 2.01 cm/m2
LA vol A4C: 52.2 ml
LA vol index: 30.3 mL/m2
LA vol: 52.8 mL
LV E/e' medial: 11.41
LV E/e'average: 11.41
LV PW d: 8.8 mm — AB (ref 0.6–1.1)
LV e' LATERAL: 8.16 cm/s
LVOT area: 2.84 cm2
LVOT diameter: 19 mm
Lateral S' vel: 15.4 cm/s
MV Dec: 225
MV Peak grad: 3 mmHg
MV pk A vel: 120 m/s
MV pk E vel: 93.1 m/s
TAPSE: 23.9 mm
TDI e' lateral: 8.16
TDI e' medial: 6.64
Weight: 2859.2 oz

## 2016-04-08 LAB — LIPID PANEL
Cholesterol: 232 mg/dL — ABNORMAL HIGH (ref 0–200)
HDL: 53 mg/dL (ref >40)
LDL Cholesterol: 132 mg/dL — ABNORMAL HIGH (ref 0–99)
Total CHOL/HDL Ratio: 4.4 RATIO
Triglycerides: 237 mg/dL — ABNORMAL HIGH (ref <150)
VLDL: 47 mg/dL — ABNORMAL HIGH (ref 0–40)

## 2016-04-08 LAB — D-DIMER, QUANTITATIVE: D-Dimer, Quant: <0.27 ug/mL-FEU (ref 0.00–0.50)

## 2016-04-08 LAB — GLUCOSE, CAPILLARY
Glucose-Capillary: 141 mg/dL — ABNORMAL HIGH (ref 65–99)
Glucose-Capillary: 157 mg/dL — ABNORMAL HIGH (ref 65–99)
Glucose-Capillary: 247 mg/dL — ABNORMAL HIGH (ref 65–99)

## 2016-04-08 LAB — I-STAT TROPONIN, ED: Troponin i, poc: 0 ng/mL (ref 0.00–0.08)

## 2016-04-08 MED ORDER — ACETAMINOPHEN 325 MG PO TABS
650.0000 mg | ORAL_TABLET | ORAL | Status: DC | PRN
  Administered 2016-04-08: 650 mg via ORAL
  Filled 2016-04-08: qty 2

## 2016-04-08 MED ORDER — NITROGLYCERIN 0.4 MG SL SUBL
0.4000 mg | SUBLINGUAL_TABLET | SUBLINGUAL | Status: DC | PRN
  Administered 2016-04-08 (×2): 0.4 mg via SUBLINGUAL
  Filled 2016-04-08 (×2): qty 1

## 2016-04-08 MED ORDER — ALBUTEROL SULFATE (2.5 MG/3ML) 0.083% IN NEBU: 3.0000 mL | INHALATION_SOLUTION | RESPIRATORY_TRACT | Status: DC | PRN

## 2016-04-08 MED ORDER — POTASSIUM CHLORIDE CRYS ER 20 MEQ PO TBCR
40.0000 meq | EXTENDED_RELEASE_TABLET | Freq: Once | ORAL | Status: AC
  Administered 2016-04-08: 40 meq via ORAL
  Filled 2016-04-08: qty 2

## 2016-04-08 MED ORDER — ENOXAPARIN SODIUM 40 MG/0.4ML ~~LOC~~ SOLN
40.0000 mg | SUBCUTANEOUS | Status: DC
Start: 2016-04-08 — End: 2016-04-08
  Administered 2016-04-08: 40 mg via SUBCUTANEOUS
  Filled 2016-04-08: qty 0.4

## 2016-04-08 MED ORDER — INSULIN GLARGINE 100 UNIT/ML ~~LOC~~ SOLN
50.0000 [IU] | Freq: Every day | SUBCUTANEOUS | Status: DC
  Filled 2016-04-08: qty 0.5

## 2016-04-08 MED ORDER — ATORVASTATIN CALCIUM 80 MG PO TABS: 80.0000 mg | ORAL_TABLET | Freq: Every day | ORAL | Status: DC

## 2016-04-08 MED ORDER — NITROGLYCERIN 0.4 MG SL SUBL: 0.4000 mg | SUBLINGUAL_TABLET | SUBLINGUAL | Status: DC | PRN

## 2016-04-08 MED ORDER — ONDANSETRON HCL 4 MG/2ML IJ SOLN: 4.0000 mg | Freq: Four times a day (QID) | INTRAMUSCULAR | Status: DC | PRN

## 2016-04-08 MED ORDER — ASPIRIN EC 325 MG PO TBEC
325.0000 mg | DELAYED_RELEASE_TABLET | Freq: Every day | ORAL | Status: DC
  Administered 2016-04-08: 325 mg via ORAL
  Filled 2016-04-08: qty 1

## 2016-04-08 MED ORDER — ACETAMINOPHEN 325 MG PO TABS
650.0000 mg | ORAL_TABLET | Freq: Once | ORAL | Status: AC
  Administered 2016-04-08: 650 mg via ORAL
  Filled 2016-04-08: qty 2

## 2016-04-08 MED ORDER — INSULIN ASPART 100 UNIT/ML ~~LOC~~ SOLN
0.0000 [IU] | SUBCUTANEOUS | Status: DC
  Administered 2016-04-08: 2 [IU] via SUBCUTANEOUS
  Administered 2016-04-08: 3 [IU] via SUBCUTANEOUS

## 2016-04-08 NOTE — Discharge Summary (Addendum)
Physician Discharge Summary  Renee Lam:[id number] DOB: 01-24-1961 DOA: 04/07/2016  PCP: [name], MD  Admit date: 04/07/2016 Discharge date: 04/08/2016  Admitted From: Home Disposition: Home  Recommendations for Outpatient Follow-up:  1. Follow up with PCP in 1-2 weeks 2. Echocardiogram report is pending.  Home Health: No   Equipment/Devices: No   Discharge Condition: Stable  CODE STATUS: Full  Diet recommendation: Heart Healthy / Carb Modified /  Brief/Interim Summary: This is a 55 year old female who presented to hospital with the chief complaint of chest pain. The pain occurred while she was at rest, it was retrosternal, constant, pressure-like and associated with shortness of breath. The pain improved after sublingual nitroglycerin. On initial physical examination blood pressure was 113/68, heart rate 81, respiratory rate 18, temperature 97.9, oxygenation 96% room air. She was awake and alert, oral mucosa was moist, lungs were clear to auscultation bilaterally, heart S1-S2 present rhythmic, abdomen soft and nontender, lower extremities with no edema. Sodium 138, potassium 3.3, BUN 8, creatinine 0.57, white count 8.1, hemoglobin 13.2, hematocrit 39, platelets 390. Negative troponins. LDL 132. D-dimer negative. EKG no signs of ischemia. Chest x-ray with no infiltrates.  Patient was admitted to the hospital with working diagnosis of atypical chest pain to rule out acute coronary syndrome.  1. Atypical chest pain. Patient had serial cardiac enzymes all came back negative for cardiac ischemia, she was seen by cardiology recommendations for no further cardiac workup.  2. Type 2 diabetes mellitus. Patient was placed on insulin sliding-scale, serum glucose ranged between 141 and 247.  3. Asthma. Remained well-controlled with no signs of exacerbation  4. Dyslipidemia. Patient was continued on statin therapy.  Discharge Diagnoses:  Principal Problem:   Chest  pain, rule out acute myocardial infarction Active Problems:   Asthma   Hypercholesterolemia   Diabetes mellitus type 2, controlled [facility])    Discharge Instructions  Discharge Instructions    Diet - low sodium heart healthy    Complete by:  As directed    Discharge instructions    Complete by:  As directed    Follow up with primary care in 7 days.   Increase activity slowly    Complete by:  As directed        Medication List    STOP taking these medications   BAYER CONTOUR NEXT MONITOR w/Device Kit   freestyle lancets   glucose blood test strip Commonly known as:  BAYER CONTOUR NEXT TEST     TAKE these medications   albuterol 108 (90 Base) MCG/ACT inhaler Commonly known as:  PROVENTIL HFA;VENTOLIN HFA Inhale 2 puffs into the lungs every 4 (four) hours as needed. For shortness of breath/wheezing   atorvastatin 80 MG tablet Commonly known as:  LIPITOR Take 1 tablet (80 mg total) by mouth at bedtime.   canagliflozin 300 MG Tabs tablet Commonly known as:  INVOKANA Take 1 tablet (300 mg total) by mouth daily.   diphenhydrAMINE 25 MG tablet Commonly known as:  BENADRYL Take 25 mg by mouth every 6 (six) hours as needed for allergies.   EPINEPHrine 0.3 mg/0.3 mL Soaj injection Commonly known as:  EPI-PEN Inject 0.3 mLs (0.3 mg total) into the muscle once.   Insulin Glargine 100 UNIT/ML Solostar Pen Commonly known as:  LANTUS SOLOSTAR Inject 50 Units into the skin at bedtime.   Insulin Pen Needle 32G X 4 MM Misc 1 Device by Other route 4 (four) times daily.   NOVOLOG FLEXPEN 100 UNIT/ML FlexPen Generic drug:  insulin aspart Inject 40 units into the skin daily before breakfast. Inject 55 units into the skin daily before lunch and dinner.      Follow-up Information    [name], MD Follow up in 1 week(s).   Specialty:  Internal Medicine Contact information: [place] [address] [phone]           Allergies Strawberries, mangoes, pineapple, eggplant, zucchini, macadamia nuts Penicillins Zithromycin  Consultations:  Cardiology    Procedures/Studies: Dg Chest 2 View  Result Date: 04/07/2016 CLINICAL DATA:  Central chest pain radiating to back.  Dyspnea. EXAM: CHEST  2 VIEW COMPARISON:  03/20/2015 FINDINGS: The heart size and mediastinal contours are within normal limits. Both lungs are clear. Slightly low lung volumes with crowding of interstitial lung markings similar to prior study. There is minimal scarring at the left lung base. The The visualized skeletal structures are unremarkable. IMPRESSION: No active cardiopulmonary disease. Electronically Signed   By: Ashley Royalty M.D.   On: 04/07/2016 20:54      Subjective: Patient is feeling well, chest pain free, no nausea or vomiting.   Discharge Exam: Vitals:   04/08/16 0900 04/08/16 1400  BP: (!) 141/69 135/80  Pulse:    Resp:  16  Temp:  98.8 F (37.1 C)   Vitals:   04/08/16 0230 04/08/16 0853 04/08/16 0900 04/08/16 1400  BP: 139/74 (!) 150/72 (!) 141/69 135/80  Pulse: 81 81    Resp: 18   16  Temp: 97.9 F (36.6 C) 98 F (36.7 C)  98.8 F (37.1 C)  TempSrc: Oral Oral  Oral  SpO2: 96% 99%  99%  Weight: 81.1 kg (178 lb 11.2 oz)     Height: '4\' 10"'$  (1.473 m)       General: Pt is alert, awake, not in acute distress Cardiovascular: RRR, S1/S2 +, no rubs, no gallops Respiratory: CTA bilaterally, no wheezing, no rhonchi Abdominal: Soft, NT, ND, bowel sounds + Extremities: no edema, no cyanosis    The results of significant diagnostics from this hospitalization (including imaging, microbiology, ancillary and laboratory) are listed below for reference.     Microbiology: No results found for this or any previous visit (from the past 240 hour(s)).   Labs: BNP (last 3 results) No results for input(s): BNP in the last 8760 hours. Basic Metabolic Panel:  Recent Labs Lab 04/07/16 1955 04/08/16 0451  NA  136 138  K 3.7 3.3*  CL 101 102  CO2 24 28  GLUCOSE 204* 183*  BUN 9 8  CREATININE 0.56 0.57  CALCIUM 9.2 8.6*   Liver Function Tests:  Recent Labs Lab 04/08/16 0451  AST 23  ALT 22  ALKPHOS 67  BILITOT 0.9  PROT 6.7  ALBUMIN 3.4*   No results for input(s): LIPASE, AMYLASE in the last 168 hours. No results for input(s): AMMONIA in the last 168 hours. CBC:  Recent Labs Lab 04/07/16 1955 04/08/16 0451  WBC 9.4 8.1  NEUTROABS  --  3.6  HGB 14.3 13.2  HCT 41.7 39.0  MCV 84.4 85.0  PLT 432* 390   Cardiac Enzymes:  Recent Labs Lab 04/08/16 0451 04/08/16 0959  TROPONINI <0.03 <0.03   BNP: Invalid input(s): POCBNP CBG:  Recent Labs Lab 04/08/16 0247 04/08/16 0742 04/08/16 1113  GLUCAP 141* 157* 247*   D-Dimer  Recent Labs  04/08/16 0451  DDIMER <0.27   Hgb A1c No results for input(s): HGBA1C in the last 72 hours. Lipid Profile  Recent Labs  04/08/16 0730  CHOL 232*  HDL 53  LDLCALC 132*  TRIG 237*  CHOLHDL 4.4   Thyroid function studies  Recent Labs  04/08/16 0730  TSH 0.775   Anemia work up No results for input(s): VITAMINB12, FOLATE, FERRITIN, TIBC, IRON, RETICCTPCT in the last 72 hours. Urinalysis    Component Value Date/Time   COLORURINE YELLOW 01/26/2014 2216   APPEARANCEUR CLEAR 01/26/2014 2216   LABSPEC 1.015 01/26/2014 2216   PHURINE 6.0 01/26/2014 2216   GLUCOSEU >1000 (A) 01/26/2014 2216   HGBUR NEGATIVE 01/26/2014 2216   [place] 01/26/2014 2216   KETONESUR NEGATIVE 01/26/2014 2216   PROTEINUR NEGATIVE 01/26/2014 2216   UROBILINOGEN 0.2 01/26/2014 2216   NITRITE NEGATIVE 01/26/2014 2216   LEUKOCYTESUR NEGATIVE 01/26/2014 2216   Sepsis Labs Invalid input(s): PROCALCITONIN,  WBC,  LACTICIDVEN Microbiology No results found for this or any previous visit (from the past 240 hour(s)).   Time coordinating discharge: 45 minutes  SIGNED:   Tawni Millers, MD  [facility] 04/08/2016,  3:22 PM Pager   If 7PM-7AM, please contact night-coverage [email or web address] Password [email or web address]

## 2016-04-08 NOTE — Progress Notes (Signed)
  Echocardiogram 2D Echocardiogram has been performed.  Renee Lam 04/08/2016, 2:05 PM

## 2016-04-08 NOTE — Consult Note (Signed)
The patient has been seen in conjunction with Vin Bhagat, PAC. All aspects of care have been considered and discussed. The patient has been personally interviewed, examined, and all clinical data has been reviewed.   This presentation is similar to 2012 when the patient had a false positive electrocardiographic response to exercise on treadmill testing and was referred for urgent catheterization which revealed widely patent normal coronary arteries.  Database to this point does not demonstrate evidence of ischemia or myocardial injury.  If cardiac markers remain normal, ambulation does not reproduce discomfort, and the patient feels well, I would recommend no further testing at this time.  Sleep study is pending.  If palpitations become more frequent, prolonged monitoring will be performed.  CARDIOLOGY CONSULT NOTE   Patient ID: Renee Lam MRN: [id number] DOB/AGE: 1960-07-03 55 y.o.  Admit date: 04/07/2016  Primary Physician   [name], MD Primary Cardiologist   Dr. Tamala Julian Reason for Consultation  Dr. Wayland Salinas Requesting Physician  Dr. Cathlean Sauer  HPI: Renee Lam is a 55 y.o. female with a history of Hypertension, hyperlipidemia, diabetes, GERD, snoring, palpitation,  prior history of pericarditis and false positive stress test who presented to [facility] ER for evaluation of chest pain.  Patient has a history of false-positive stress test 08/2010. She had chest pain and jaw discomfort while on treadmill. During postrecovery there was diffuse T-wave inversion and ST segment depression with progression of chest pain into scapular area. Emergent cardiac catheterization showed no atherosclerosis of coronary arteries.  Last seen by Dr. Tamala Julian 03/04/16 for evaluation of palpitation. No monitor was ordered due to unusual duration. Patient was scheduled to have a sleep study later this month to exclude obstructive sleep apnea.   The patient states that he  continues to have palpitations occasionally. Last episode approximately 2-3 weeks ago. 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 Associated with shortness of breath and nausea. She went to urgent care for evaluation and sent to ER for further workup. Her pain was constant in a scale of 3-4 out of 10. However it was exacerbated by exertion. Her chest pain eventually resolved after receiving sublingual nitroglycerin x 2  in ER. Patient flew to [place] few weeks ago. Denies tobacco abuse or illicit drug use. Patient denies orthopnea, PND, syncope, lower extremity edema, melena, blood in his stool or urine.  EKG shows sinus rhythm at rate of 70 bpm with nonspecific T-wave inversion in inferior lead which appears similar to prior EKG. Point-of-care troponin negative x 2. Troponin I negative. K of 3.3. Chest x-ray without acute cardiopulmonary disease. D-dimer negative. Elevated blood glucose. Currently chest pain-free.   Past Medical History:  Diagnosis Date  . Allergy   . Arthritis   . Asthma   . Chicken pox   . Diabetes mellitus   . Finger infection 2008   isolation  . GERD (gastroesophageal reflux disease)   . Hyperlipidemia   . Hypertension   . Migraine   . Pericarditis   . Viral meningitis      Past Surgical History:  Procedure Laterality Date  . BREAST SURGERY  2002   breast bx  . CARDIAC CATHETERIZATION  2012  . CESAREAN SECTION     1997, 2000  . KNEE ARTHROSCOPY  2010   right  . ORIF HUMERUS FRACTURE Right 03/25/2015   Procedure: OPEN REDUCTION INTERNAL FIXATION (ORIF) DISTAL  HUMERUS FRACTURE;  Surgeon: Renette Butters, MD;  Location: [facility];  Service: Orthopedics;  Laterality: Right;   Allergies Penicillins Azithromycin Food: Strawberries, mangoes, pineapple, eggplant, zucchini, macadamia nuts  I have reviewed the patient's current medications .  aspirin EC  325 mg Oral Daily  . atorvastatin  80 mg Oral QHS  . enoxaparin (LOVENOX) injection  40 mg Subcutaneous Q24H  . insulin aspart  0-9 Units Subcutaneous Q4H  . insulin glargine  50 Units Subcutaneous QHS  . potassium chloride  40 mEq Oral Once     acetaminophen, albuterol, nitroGLYCERIN, ondansetron (ZOFRAN) IV  Prior to Admission medications   Medication Sig Start Date End Date Taking? Authorizing Provider  albuterol (PROVENTIL HFA;VENTOLIN HFA) 108 (90 Base) MCG/ACT inhaler Inhale 2 puffs into the lungs every 4 (four) hours as needed. For shortness of breath/wheezing 11/25/15 03/03/17 Yes Marletta Lor, MD  atorvastatin (LIPITOR) 80 MG tablet Take 1 tablet (80 mg total) by mouth at bedtime. 11/25/15 02/10/17 Yes Marletta Lor, MD  Canagliflozin (INVOKANA) 300 MG TABS Take 1 tablet (300 mg total) by mouth daily. 02/01/14  Yes Marletta Lor, MD  diphenhydrAMINE (BENADRYL) 25 MG tablet Take 25 mg by mouth every 6 (six) hours as needed for allergies.    Yes Historical Provider, MD  insulin aspart (NOVOLOG FLEXPEN) 100 UNIT/ML FlexPen Inject 40 units into the skin daily before breakfast. Inject 55 units into the skin daily before lunch and dinner.   Yes Historical Provider, MD  Insulin Glargine (LANTUS SOLOSTAR) 100 UNIT/ML Solostar Pen Inject 50 Units into the skin at bedtime. 11/25/15  Yes Marletta Lor, MD  Blood Glucose Monitoring Suppl (BAYER CONTOUR NEXT MONITOR) w/Device KIT Use as directed 01/01/16   Marletta Lor, MD  EPINEPHrine 0.3 mg/0.3 mL IJ SOAJ injection Inject 0.3 mLs (0.3 mg total) into the muscle once. 11/25/15   Marletta Lor, MD  glucose blood (BAYER CONTOUR NEXT TEST) test strip Use to test four times a day as instructed. 01/01/16   Marletta Lor, MD  glucose blood (FREESTYLE INSULINX TEST) test strip USE TO CHECK BLOOD SUGAR FOUR TIMES A DAY AND PRN.. 11/25/15   Marletta Lor, MD  Insulin Pen Needle 32G X 4 MM MISC 1 Device by  Other route 4 (four) times daily. 11/25/15   Marletta Lor, MD  Lancets (FREESTYLE) lancets USE TO CHECK BLOOD SUGAR FOUR TIMES A DAY AND PRN 11/25/15   Marletta Lor, MD     Social History   Social History  . Marital status: Divorced    Spouse name: N/A  . Number of children: N/A  . Years of education: N/A   Occupational History  . Not on file.   Social History Main Topics  . Smoking status: Never Smoker  . Smokeless tobacco: Never Used  . Alcohol use Yes     Comment: once a year  . Drug use: No  . Sexual activity: Yes    Birth control/ protection: Post-menopausal   Other Topics Concern  . Not on file   Social History Narrative  . No narrative on file    Family Status  Relation Status  . Mother Deceased  . Father Deceased  . Other   . Neg Hx    Family History  Problem Relation Age of Onset  . Colon polyps Mother   . Diabetes Mother   . Heart failure Mother   . Diabetes Father   . Heart disease  Father   . Arthritis Father   . Hypertension Father   . Heart failure Father   . Arthritis Other   . Cancer Other     colon,ovary/uterus,breast,prostate  . Hyperlipidemia Other   . Heart disease Other   . Hypertension Other   . Depression Other   . Colon cancer Neg Hx   . Stomach cancer Neg Hx      ROS:  Full 14 point review of systems complete and found to be negative unless listed above.  Physical Exam: Blood pressure 139/74, pulse 81, temperature 97.9 F (36.6 C), temperature source Oral, resp. rate 18, height 4' 10"  (1.473 m), weight 178 lb 11.2 oz (81.1 kg), SpO2 96 %.  General: Well developed, well nourished, female in no acute distress Head: Eyes PERRLA, No xanthomas. Normocephalic and atraumatic, oropharynx without edema or exudate.  Lungs: Resp regular and unlabored, CTA. Heart: RRR no s3, s4, or murmurs..   Neck: No carotid bruits. No lymphadenopathy.  JVD. Abdomen: Bowel sounds present, abdomen soft and non-tender without masses or  hernias noted. Msk:  No spine or cva tenderness. No weakness, no joint deformities or effusions. Extremities: No clubbing, cyanosis or edema. DP/PT/Radials 2+ and equal bilaterally. Neuro: Alert and oriented X 3. No focal deficits noted. Psych:  Good affect, responds appropriately Skin: No rashes or lesions noted.  Labs:   Lab Results  Component Value Date   WBC 8.1 04/08/2016   HGB 13.2 04/08/2016   HCT 39.0 04/08/2016   MCV 85.0 04/08/2016   PLT 390 04/08/2016   No results for input(s): INR in the last 72 hours.  Recent Labs Lab 04/08/16 0451  NA 138  K 3.3*  CL 102  CO2 28  BUN 8  CREATININE 0.57  CALCIUM 8.6*  PROT 6.7  BILITOT 0.9  ALKPHOS 67  ALT 22  AST 23  GLUCOSE 183*  ALBUMIN 3.4*   No results found for: MG  Recent Labs  04/08/16 0451  TROPONINI <0.03    Recent Labs  04/07/16 1958 04/08/16 0014  TROPIPOC 0.00 0.00   No results found for: PROBNP Lab Results  Component Value Date   CHOL 242 (A) 03/10/2015   HDL 54 03/10/2015   LDLCALC 136 03/10/2015   TRIG 261 (A) 03/10/2015   Lab Results  Component Value Date   DDIMER <0.27 04/08/2016   No results found for: LIPASE, AMYLASE TSH  Date/Time Value Ref Range Status  08/20/2010 01:22 PM 1.249 0.350 - 4.500 uIU/mL Final   No results found for: VITAMINB12, FOLATE, FERRITIN, TIBC, IRON, RETICCTPCT  Echo: Pending  Cath 08/2010 1. Hemodynamic data:     a.     Aortic pressure 93/67.     b.     Left ventricular pressure 104/13. 2. Left ventriculography:  Left ventricular cavity size and function     are normal.  The ejection fraction is 65-70%.  No mitral     regurgitation. 3. Coronary angiography.     a.     Left main coronary artery:  The left main is short and is      widely patent.     b.     Left anterior descending coronary artery:  Large vessel that      is transapical and is widely patent.  Three diagonal branches are      widely patent.     c.     Circumflex artery:  The  circumflex is dominant.  There is  deep intubation of the proximal circumflex with the diagnostic      catheter.  Care was taken to avoid vigorous injections to prevent      dissection.  Five obtuse marginal branches and the PDA arise from      the circumflex.  All are entirely normal.     d.     Right coronary artery:  The right coronary is nondominant.      The vessel is widely patent.  CONCLUSION: 1. Normal epicardial coronary arteries. 2. Normal left ventricular function. 3. False positive electrocardiographic response to exercise. 4. Chest pain felt to be noncardiac in origin.   Radiology:  Dg Chest 2 View  Result Date: 04/07/2016 CLINICAL DATA:  Central chest pain radiating to back.  Dyspnea. EXAM: CHEST  2 VIEW COMPARISON:  03/20/2015 FINDINGS: The heart size and mediastinal contours are within normal limits. Both lungs are clear. Slightly low lung volumes with crowding of interstitial lung markings similar to prior study. There is minimal scarring at the left lung base. The The visualized skeletal structures are unremarkable. IMPRESSION: No active cardiopulmonary disease. Electronically Signed   By: Ashley Royalty M.D.   On: 04/07/2016 20:54    ASSESSMENT AND PLAN:     1. Chest pain - Concerning for unstable angina. However patient think this it might be due to asthma. No wheezing noted. No illness. Troponin negative so far continue cycle. EKG without acute changes. Her chest pressure was responsive to sublingual nitroglycerin. She does have a history of false-positive exercise stress test in 08/2010. Currently chest pain-free. Will review with M.D. D-dimer negative. Will get lipid panel and TSH for risk stratification. - Start ASA 74m daily. Her blood pressure is relatively stable. Given hx of palpitations. Consider adding low dose BB.   2. Palpitation - Stable. Last episode 2-3 weeks ago. Telemetry without any arrhythmia. If worsening, will consider 30 days monitor.   3.  Snoring - Pending sleep study  4. Diabetes - Per primary.  5. HLD -Pending lipid panel. Continue statin.  6. HTN - Not on any medical therapy. Relatively stable.   Signed:Leanor Kail PA 04/08/2016, 7:11 AM Pager [phone] Co-Sign MD

## 2016-04-08 NOTE — Plan of Care (Signed)
Problem: Pain Managment: Goal: General experience of comfort will improve Outcome: Completed/Met Date Met: 04/08/16 Pt remains chest pain free  Problem: Consults Goal: Chest Pain Patient Education (See Patient Education module for education specifics.) Outcome: Completed/Met Date Met: 04/08/16 Pt educated on chest pain   Problem: Phase I Progression Outcomes Goal: Anginal pain relieved Outcome: Completed/Met Date Met: 04/08/16 Pt remains chest pain free  Problem: Discharge Progression Outcomes Goal: No anginal pain Outcome: Completed/Met Date Met: 04/08/16 Pt remains chest pain free

## 2016-04-08 NOTE — H&P (Signed)
History and Physical    Renee Lam KLK:[id number] DOB: May 12, 1961 DOA: 04/07/2016  PCP: [name], MD  Patient coming from: Home.  Chief Complaint: Chest pain.  HPI: Renee Lam is a 55 y.o. female with history of diabetes mellitus type 2, hyperlipidemia, asthma presents to the ER because of chest pain. 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 Pain is retrosternal constant pressure-like with some associated shortness of breath. Since the pain was persistent patient went to the urgent care and was referred to the ER. Pain resolved after patient received sublingual nitroglycerin. Patient is chest pain-free at this time. EKG shows normal sinus rhythm with troponins negative. Of recently patient has been having palpitations off and on and has been to cardiologist who has advised to get tested for sleep apnea and is scheduled next week.   ED Course: EKG chest x-ray and troponins were unremarkable.  Review of Systems: As per HPI, rest all negative.   Past Medical History:  Diagnosis Date  . Allergy   . Arthritis   . Asthma   . Chicken pox   . Diabetes mellitus   . Finger infection 2008   isolation  . GERD (gastroesophageal reflux disease)   . Hyperlipidemia   . Hypertension   . Migraine   . Pericarditis   . Viral meningitis     Past Surgical History:  Procedure Laterality Date  . BREAST SURGERY  2002   breast bx  . CARDIAC CATHETERIZATION  2012  . CESAREAN SECTION     1997, 2000  . KNEE ARTHROSCOPY  2010   right  . ORIF HUMERUS FRACTURE Right 03/25/2015   Procedure: OPEN REDUCTION INTERNAL FIXATION (ORIF) DISTAL HUMERUS FRACTURE;  Surgeon: Renette Butters, MD;  Location: [place] [facility];  Service: Orthopedics;  Laterality: Right;     reports that she has never smoked. She has never used smokeless tobacco. She reports that she drinks alcohol. She reports that she does not use drugs.  Allergies -  Penicillin, Zithromax.  Family History  Problem Relation Age of Onset  . Colon polyps Mother   . Diabetes Mother   . Heart failure Mother   . Diabetes Father   . Heart disease Father   . Arthritis Father   . Hypertension Father   . Heart failure Father   . Arthritis Other   . Cancer Other     colon,ovary/uterus,breast,prostate  . Hyperlipidemia Other   . Heart disease Other   . Hypertension Other   . Depression Other   . Colon cancer Neg Hx   . Stomach cancer Neg Hx     Prior to Admission medications   Medication Sig Start Date End Date Taking? Authorizing Provider  albuterol (PROVENTIL HFA;VENTOLIN HFA) 108 (90 Base) MCG/ACT inhaler Inhale 2 puffs into the lungs every 4 (four) hours as needed. For shortness of breath/wheezing 11/25/15 03/03/17 Yes Marletta Lor, MD  atorvastatin (LIPITOR) 80 MG tablet Take 1 tablet (80 mg total) by mouth at bedtime. 11/25/15 02/10/17 Yes Marletta Lor, MD  Canagliflozin (INVOKANA) 300 MG TABS Take 1 tablet (300 mg total) by mouth daily. 02/01/14  Yes Marletta Lor, MD  diphenhydrAMINE (BENADRYL) 25 MG tablet Take 25 mg by mouth every 6 (six) hours as needed for allergies.    Yes Historical Provider, MD  insulin aspart (NOVOLOG FLEXPEN) 100 UNIT/ML FlexPen Inject 40 units into the skin daily before breakfast. Inject 55 units into the skin daily  before lunch and dinner.   Yes Historical Provider, MD  Insulin Glargine (LANTUS SOLOSTAR) 100 UNIT/ML Solostar Pen Inject 50 Units into the skin at bedtime. 11/25/15  Yes Marletta Lor, MD  Blood Glucose Monitoring Suppl (BAYER CONTOUR NEXT MONITOR) w/Device KIT Use as directed 01/01/16   Marletta Lor, MD  EPINEPHrine 0.3 mg/0.3 mL IJ SOAJ injection Inject 0.3 mLs (0.3 mg total) into the muscle once. 11/25/15   Marletta Lor, MD  glucose blood (BAYER CONTOUR NEXT TEST) test strip Use to test four times a day as instructed. 01/01/16   Marletta Lor, MD  glucose blood  (FREESTYLE INSULINX TEST) test strip USE TO CHECK BLOOD SUGAR FOUR TIMES A DAY AND PRN.. 11/25/15   Marletta Lor, MD  Insulin Pen Needle 32G X 4 MM MISC 1 Device by Other route 4 (four) times daily. 11/25/15   Marletta Lor, MD  Lancets (FREESTYLE) lancets USE TO CHECK BLOOD SUGAR FOUR TIMES A DAY AND PRN 11/25/15   Marletta Lor, MD    Physical Exam: Vitals:   04/08/16 0039 04/08/16 0100 04/08/16 0130 04/08/16 0230  BP: 113/68 123/81 145/82 139/74  Pulse:    81  Resp: 19 18 12 18   Temp:    97.9 F (36.6 C)  TempSrc:    Oral  SpO2:    96%  Weight:    81.1 kg (178 lb 11.2 oz)  Height:    4' 10"  (1.473 m)      Constitutional: Moderately built and nourished. Vitals:   04/08/16 0039 04/08/16 0100 04/08/16 0130 04/08/16 0230  BP: 113/68 123/81 145/82 139/74  Pulse:    81  Resp: 19 18 12 18   Temp:    97.9 F (36.6 C)  TempSrc:    Oral  SpO2:    96%  Weight:    81.1 kg (178 lb 11.2 oz)  Height:    4' 10"  (1.473 m)   Eyes: Anicteric no pallor. ENMT: No discharge from the ears eyes nose or mouth. Neck: No mass felt. No neck rigidity. Respiratory: No rhonchi or crepitations. Cardiovascular: S1 and S2 heard no murmurs appreciated. Abdomen: Soft nontender bowel sounds present. No guarding or rigidity. Musculoskeletal: No edema. No joint effusion. Skin: No rash. Skin appears warm. Neurologic: Alert awake oriented to time place and person. Moves all extremities 5 x 5. Psychiatric: Appears normal. Normal affect.   Labs on Admission: I have personally reviewed following labs and imaging studies  CBC:  Recent Labs Lab 04/07/16 1955  WBC 9.4  HGB 14.3  HCT 41.7  MCV 84.4  PLT 546*   Basic Metabolic Panel:  Recent Labs Lab 04/07/16 1955  NA 136  K 3.7  CL 101  CO2 24  GLUCOSE 204*  BUN 9  CREATININE 0.56  CALCIUM 9.2   GFR: Estimated Creatinine Clearance: 70.7 mL/min (by C-G formula based on SCr of 0.56 mg/dL). Liver Function Tests: No results  for input(s): AST, ALT, ALKPHOS, BILITOT, PROT, ALBUMIN in the last 168 hours. No results for input(s): LIPASE, AMYLASE in the last 168 hours. No results for input(s): AMMONIA in the last 168 hours. Coagulation Profile: No results for input(s): INR, PROTIME in the last 168 hours. Cardiac Enzymes: No results for input(s): CKTOTAL, CKMB, CKMBINDEX, TROPONINI in the last 168 hours. BNP (last 3 results) No results for input(s): PROBNP in the last 8760 hours. HbA1C: No results for input(s): HGBA1C in the last 72 hours. CBG:  Recent Labs Lab 04/08/16  0247  GLUCAP 141*   Lipid Profile: No results for input(s): CHOL, HDL, LDLCALC, TRIG, CHOLHDL, LDLDIRECT in the last 72 hours. Thyroid Function Tests: No results for input(s): TSH, T4TOTAL, FREET4, T3FREE, THYROIDAB in the last 72 hours. Anemia Panel: No results for input(s): VITAMINB12, FOLATE, FERRITIN, TIBC, IRON, RETICCTPCT in the last 72 hours. Urine analysis:    Component Value Date/Time   COLORURINE YELLOW 01/26/2014 2216   APPEARANCEUR CLEAR 01/26/2014 2216   LABSPEC 1.015 01/26/2014 2216   PHURINE 6.0 01/26/2014 2216   GLUCOSEU >1000 (A) 01/26/2014 2216   HGBUR NEGATIVE 01/26/2014 2216   BILIRUBINUR NEGATIVE 01/26/2014 2216   KETONESUR NEGATIVE 01/26/2014 2216   PROTEINUR NEGATIVE 01/26/2014 2216   UROBILINOGEN 0.2 01/26/2014 2216   NITRITE NEGATIVE 01/26/2014 2216   LEUKOCYTESUR NEGATIVE 01/26/2014 2216   Sepsis Labs: @LABRCNTIP (procalcitonin:4,lacticidven:4) )No results found for this or any previous visit (from the past 240 hour(s)).   Radiological Exams on Admission: Dg Chest 2 View  Result Date: 04/07/2016 CLINICAL DATA:  Central chest pain radiating to back.  Dyspnea. EXAM: CHEST  2 VIEW COMPARISON:  03/20/2015 FINDINGS: The heart size and mediastinal contours are within normal limits. Both lungs are clear. Slightly low lung volumes with crowding of interstitial lung markings similar to prior study. There is  minimal scarring at the left lung base. The The visualized skeletal structures are unremarkable. IMPRESSION: No active cardiopulmonary disease. Electronically Signed   By: Ashley Royalty M.D.   On: 04/07/2016 20:54    EKG: Independently reviewed. Normal sinus rhythm.  Assessment/Plan Principal Problem:   Chest pain, rule out acute myocardial infarction Active Problems:   Asthma   Hypercholesterolemia   Diabetes mellitus type 2, controlled ([place])    1. Chest pain - improved with sublingual nitroglycerin and is presently chest pain-free. Given the risk factors including diabetes will cycle cardiac markers to rule out ACS. Patient has had a normal cardiac cath in 2012. Will check 2-D echo keep patient on when necessary nitroglycerin, aspirin. Patient will be nothing by mouth in anticipation of cardiac procedure. Consult cardiologist in a.m. Check d-dimer. 2. Diabetes mellitus type 2 - patient states her hemoglobin A1c in April was 7.1. Patient takes large doses of NovoLog pre-meals. Since patient is nothing by mouth I have placed patient on sliding scale coverage. Patient also on Lantus at bedtime, dose of which has to be reduced if patient is going to be nothing by mouth past midnight. 3. Hyperlipidemia on statins. 4. Asthma - presently not wheezing.   DVT prophylaxis: Lovenox. Code Status: Full code.  Family Communication: Discussed with patient.  Disposition Plan: Home.  Consults called: None.  Admission status: Observation.    Rise Patience MD [facility] Pager [phone].  If 7PM-7AM, please contact night-coverage [email or web address] Password [email or web address]  04/08/2016, 4:40 AM

## 2016-04-08 NOTE — Progress Notes (Signed)
Patient arrived from ED. Currently chest pain free after receiving 2 sublingual nitros in ED. Just complains of headache. Paged [facility] to inform of patient's arrival. MD [facility] aware of patient's arrival to 3W17. Will continue to monitor for admission orders.

## 2016-04-08 NOTE — ED Notes (Signed)
Attempted report 

## 2016-04-20 ENCOUNTER — Ambulatory Visit (INDEPENDENT_AMBULATORY_CARE_PROVIDER_SITE_OTHER): Payer: BLUE CROSS/BLUE SHIELD | Admitting: Family Medicine

## 2016-04-20 ENCOUNTER — Encounter: Payer: Self-pay | Admitting: Family Medicine

## 2016-04-20 VITALS — BP 120/80 | HR 87 | Temp 98.3°F | Ht <= 58 in | Wt 177.0 lb

## 2016-04-20 DIAGNOSIS — R0789 Other chest pain: Secondary | ICD-10-CM

## 2016-04-20 DIAGNOSIS — Z794 Long term (current) use of insulin: Secondary | ICD-10-CM

## 2016-04-20 DIAGNOSIS — E11319 Type 2 diabetes mellitus with unspecified diabetic retinopathy without macular edema: Secondary | ICD-10-CM

## 2016-04-20 DIAGNOSIS — R1310 Dysphagia, unspecified: Secondary | ICD-10-CM | POA: Diagnosis not present

## 2016-04-20 DIAGNOSIS — E7800 Pure hypercholesterolemia, unspecified: Secondary | ICD-10-CM

## 2016-04-20 NOTE — Progress Notes (Signed)
Pre visit review using our clinic review tool, if applicable. No additional management support is needed unless otherwise documented below in the visit note. 

## 2016-04-20 NOTE — Patient Instructions (Signed)
Keep follow up with your diabetes specialist and with your primary doctor.  Get the sleep study as planned.  We placed a referral for you as discussed to the gastroenterologist for the atypical chest pain and the dysphagia. It usually takes about 1-2 weeks to process and schedule this referral. If you have not heard from [place] regarding this appointment in 2 weeks please contact our office. You may want to try a once daily acid reducer over the counter for 1-2 weeks to see if this helps with your symptoms.  We recommend the following healthy lifestyle for LIFE: 1) Small portions.   Tip: eat off of a salad plate instead of a dinner plate.  Tip: It is ok to feel hungry after a meal if you had a proper portion size  Tip: if you need more or a snack choose fruits, veggies and/or a handful of nuts or seeds.  2) Eat a healthy clean diet.  * Tip: Avoid (less then 1 serving per week): processed foods, sweets, sweetened drinks, white starches (rice, flour, bread, potatoes, pasta, etc), red meat, fast foods, butter  *Tip: CHOOSE instead   * 5-9 servings per day of fresh or frozen fruits and vegetables (but not corn, potatoes, bananas, canned or dried fruit)   *nuts and seeds, beans   *olives and olive oil   *small portions of lean meats such as fish and white chicken    *small portions of whole grains  3)Get at least 150 minutes of sweaty aerobic exercise per week.  4)Reduce stress - consider counseling, meditation and relaxation to balance other aspects of your life.

## 2016-04-20 NOTE — Progress Notes (Signed)
HPI:  Renee Lam is a pleasant 55 yo F with a PMH sig for asthma, GERD, allergies, DM, HTN and HLD here for hospital follow up. PCP unavailable today. Hospitalized 03/2509/26/2017 for atypical chest pain, palpitations -Per discharge summary serial enzymes, chest x-ray and d-dimer were negative and she was evaluated by cardiology with recommendations for no further evaluation for cardiac etiology, sleep study pending and further eval for palpitations only if they were to worsen -per cardiology had eval in 2012 for the same with false positive stress test and neg cardiac cath -Echo showed normal EF, normal wall motion with mild diastolic dysfunction -she is on insulin and invokana for her diabetes, a statin and prn alb for her asthma -no CP, SOB, DOE, palpitations, wheezing, swelling or cough today -reports has been under increased stress and reports her cardiologist thought this caused her CP -she has a hx of GERD and reports for the last 2 months dysphagia to solids occurring about 2x per week, chokes on her food, takes about"1 minute to get it down" and "food comes back up out my nose" -still has intermittent substernal chest discomfort - at rest, can last for hours, not associated with SOB and not relieved by alb -she does not have regular reflux of material in mouth or typical heartburn, change in bowels, unexplained wt loss -report no regular exercise until very recently and diet poor - but she seems interested in trying to change -reports she is scheduled for her sleep study   ROS: See pertinent positives and negatives per HPI.  Past Medical History:  Diagnosis Date  . Allergy   . Arthritis   . Asthma   . Chicken pox   . Diabetes mellitus   . Finger infection 2008   isolation  . GERD (gastroesophageal reflux disease)   . Hyperlipidemia   . Hypertension   . Migraine   . Pericarditis   . Viral meningitis     Past Surgical History:  Procedure Laterality Date  .  BREAST SURGERY  2002   breast bx  . CARDIAC CATHETERIZATION  2012  . CESAREAN SECTION     1997, 2000  . KNEE ARTHROSCOPY  2010   right  . ORIF HUMERUS FRACTURE Right 03/25/2015   Procedure: OPEN REDUCTION INTERNAL FIXATION (ORIF) DISTAL HUMERUS FRACTURE;  Surgeon: Renette Butters, MD;  Location: [facility];  Service: Orthopedics;  Laterality: Right;    Family History  Problem Relation Age of Onset  . Colon polyps Mother   . Diabetes Mother   . Heart failure Mother   . Diabetes Father   . Heart disease Father   . Arthritis Father   . Hypertension Father   . Heart failure Father   . Arthritis Other   . Cancer Other     colon,ovary/uterus,breast,prostate  . Hyperlipidemia Other   . Heart disease Other   . Hypertension Other   . Depression Other   . Colon cancer Neg Hx   . Stomach cancer Neg Hx     Social History   Social History  . Marital status: Divorced    Spouse name: N/A  . Number of children: N/A  . Years of education: N/A   Social History Main Topics  . Smoking status: Never Smoker  . Smokeless tobacco: Never Used  . Alcohol use Yes     Comment: once a year  . Drug use: No  . Sexual activity: Yes    Birth control/ protection: Post-menopausal  Other Topics Concern  . None   Social History Narrative  . None     Current Outpatient Prescriptions:  .  albuterol (PROVENTIL HFA;VENTOLIN HFA) 108 (90 Base) MCG/ACT inhaler, Inhale 2 puffs into the lungs every 4 (four) hours as needed. For shortness of breath/wheezing, Disp: 1 Inhaler, Rfl: 12 .  atorvastatin (LIPITOR) 80 MG tablet, Take 1 tablet (80 mg total) by mouth at bedtime., Disp: 90 tablet, Rfl: 3 .  Canagliflozin (INVOKANA) 300 MG TABS, Take 1 tablet (300 mg total) by mouth daily., Disp: 30 tablet, Rfl: 6 .  diphenhydrAMINE (BENADRYL) 25 MG tablet, Take 25 mg by mouth every 6 (six) hours as needed for allergies. , Disp: , Rfl:  .  EPINEPHrine 0.3 mg/0.3 mL IJ SOAJ injection, Inject 0.3  mLs (0.3 mg total) into the muscle once., Disp: 1 Device, Rfl: 1 .  insulin aspart (NOVOLOG FLEXPEN) 100 UNIT/ML FlexPen, Inject 40 units into the skin daily before breakfast. Inject 55 units into the skin daily before lunch and dinner., Disp: , Rfl:  .  Insulin Glargine (LANTUS SOLOSTAR) 100 UNIT/ML Solostar Pen, Inject 50 Units into the skin at bedtime., Disp: 20 pen, Rfl: 3 .  Insulin Pen Needle 32G X 4 MM MISC, 1 Device by Other route 4 (four) times daily., Disp: 400 each, Rfl: 12  EXAM:  Vitals:   04/20/16 1304  BP: 120/80  Pulse: 87  Temp: 98.3 F (36.8 C)    Body mass index is 36.99 kg/m.  GENERAL: vitals reviewed and listed above, alert, oriented, appears well hydrated and in no acute distress  HEENT: atraumatic, conjunttiva clear, no obvious abnormalities on inspection of external nose and ears  NECK: no obvious masses on inspection  LUNGS: clear to auscultation bilaterally, no wheezes, rales or rhonchi, good air movement  CV: HRRR, no peripheral edema  MS: moves all extremities without noticeable abnormality  PSYCH: pleasant and cooperative, no obvious depression or anxiety  ASSESSMENT AND PLAN:  Discussed the following assessment and plan:  Atypical chest pain - Plan: Ambulatory referral to Gastroenterology  Dysphagia, unspecified type - Plan: Ambulatory referral to Gastroenterology  Morbid obesity ([place])  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, with long-term current use of insulin, macular edema presence unspecified, unspecified laterality, unspecified retinopathy severity ([place])  Hypercholesterolemia  -discussed causes of atypical cp and dysphagia and opted for GI evaluation for these issues, consideration short course OTC antiacid in interim -strongly advised lifestyle changes for wt reduction, further control of her cholesterol and diabetes and to lower CV risks and reduce stress - discussed the Mediterranean diet  -she has follow up with  her endocrinologist later this month for her diabetes and has PCP follow up next month - will defer changes to diabetes and cholesterol medications to her regular providers -advised follow up or seeking care sooner if any symptoms worsening or severe or new concerns   Patient Instructions  Keep follow up with your diabetes specialist and with your primary doctor.  Get the sleep study as planned.  We placed a referral for you as discussed to the gastroenterologist for the atypical chest pain and the dysphagia. It usually takes about 1-2 weeks to process and schedule this referral. If you have not heard from [place] regarding this appointment in 2 weeks please contact our office. You may want to try a once daily acid reducer over the counter for 1-2 weeks to see if this helps with your symptoms.  We recommend the following healthy lifestyle for  LIFE: 1) Small portions.   Tip: eat off of a salad plate instead of a dinner plate.  Tip: It is ok to feel hungry after a meal if you had a proper portion size  Tip: if you need more or a snack choose fruits, veggies and/or a handful of nuts or seeds.  2) Eat a healthy clean diet.  * Tip: Avoid (less then 1 serving per week): processed foods, sweets, sweetened drinks, white starches (rice, flour, bread, potatoes, pasta, etc), red meat, fast foods, butter  *Tip: CHOOSE instead   * 5-9 servings per day of fresh or frozen fruits and vegetables (but not corn, potatoes, bananas, canned or dried fruit)   *nuts and seeds, beans   *olives and olive oil   *small portions of lean meats such as fish and white chicken    *small portions of whole grains  3)Get at least 150 minutes of sweaty aerobic exercise per week.  4)Reduce stress - consider counseling, meditation and relaxation to balance other aspects of your life.     Colin Benton R., DO

## 2016-04-28 ENCOUNTER — Encounter: Payer: Self-pay | Admitting: Nurse Practitioner

## 2016-04-28 ENCOUNTER — Ambulatory Visit (INDEPENDENT_AMBULATORY_CARE_PROVIDER_SITE_OTHER): Payer: BLUE CROSS/BLUE SHIELD | Admitting: Nurse Practitioner

## 2016-04-28 VITALS — BP 144/76 | HR 84 | Ht 59.0 in | Wt 178.1 lb

## 2016-04-28 DIAGNOSIS — R1310 Dysphagia, unspecified: Secondary | ICD-10-CM

## 2016-04-28 NOTE — Progress Notes (Signed)
HPI: Patient is a 55 year old female with a history of asthma, diabetes and HTN. She is known to Dr. Henrene Pastor for history of adenomatous colon polyps, due for recall in January. She is referred by PCP Dr. Colin Benton for evaluation of solid food dysphagia over the last month. Rice, a major culprit, often migrates into her nose. No heartburn or regurgitation. No odynophagia. No weight loss. Often feels "gurgling" in esophagus during the day which sounds to others like a snoring sound. She does snore at night and is undergoing workup for OSA.   Patient hospitalized a couple of weeks ago with chest pain. Serial cardiac enzymes were negative. No further cardiac workup needed per Cardiology   Past Medical History:  Diagnosis Date  . Allergy   . Arthritis   . Asthma   . Chicken pox   . Diabetes mellitus   . Finger infection 2008   isolation  . GERD (gastroesophageal reflux disease)   . Hyperlipidemia   . Hypertension   . Migraine   . Pericarditis   . Viral meningitis     Past Surgical History:  Procedure Laterality Date  . BREAST SURGERY  2002   breast bx  . CARDIAC CATHETERIZATION  2012  . CESAREAN SECTION     1997, 2000  . KNEE ARTHROSCOPY  2010   right  . ORIF HUMERUS FRACTURE Right 03/25/2015   Procedure: OPEN REDUCTION INTERNAL FIXATION (ORIF) DISTAL HUMERUS FRACTURE;  Surgeon: Renette Butters, MD;  Location: [facility];  Service: Orthopedics;  Laterality: Right;   Family History  Problem Relation Age of Onset  . Colon polyps Mother   . Diabetes Mother   . Heart failure Mother   . Diabetes Father   . Heart disease Father   . Arthritis Father   . Hypertension Father   . Heart failure Father   . Arthritis Other   . Cancer Other     colon,ovary/uterus,breast,prostate  . Hyperlipidemia Other   . Heart disease Other   . Hypertension Other   . Depression Other   . Colon cancer Neg Hx   . Stomach cancer Neg Hx    Social History  Substance Use  Topics  . Smoking status: Never Smoker  . Smokeless tobacco: Never Used  . Alcohol use Yes     Comment: once a year   Current Outpatient Prescriptions  Medication Sig Dispense Refill  . albuterol (PROVENTIL HFA;VENTOLIN HFA) 108 (90 Base) MCG/ACT inhaler Inhale 2 puffs into the lungs every 4 (four) hours as needed. For shortness of breath/wheezing 1 Inhaler 12  . atorvastatin (LIPITOR) 80 MG tablet Take 1 tablet (80 mg total) by mouth at bedtime. 90 tablet 3  . Canagliflozin (INVOKANA) 300 MG TABS Take 1 tablet (300 mg total) by mouth daily. 30 tablet 6  . diphenhydrAMINE (BENADRYL) 25 MG tablet Take 25 mg by mouth every 6 (six) hours as needed for allergies.     Marland Kitchen insulin aspart (NOVOLOG FLEXPEN) 100 UNIT/ML FlexPen Inject 40 units into the skin daily before breakfast. Inject 55 units into the skin daily before lunch and dinner.    . Insulin Glargine (LANTUS SOLOSTAR) 100 UNIT/ML Solostar Pen Inject 50 Units into the skin at bedtime. 20 pen 3  . Insulin Pen Needle 32G X 4 MM MISC 1 Device by Other route 4 (four) times daily. 400 each 12  . EPINEPHrine 0.3 mg/0.3 mL IJ SOAJ injection Inject 0.3 mLs (0.3 mg total) into  the muscle once. (Patient not taking: Reported on 04/28/2016) 1 Device 1   No current facility-administered medications for this visit.    Allergies  Allergen Reactions  . Food     Strawberries, mangoes, pineapple, eggplant, zucchini, macadamia nuts   . Penicillins Hives    Has patient had a PCN reaction causing immediate rash, facial/tongue/throat swelling, SOB or lightheadedness with hypotension:  Has patient had a PCN reaction causing severe rash involving mucus membranes or skin necrosis: Has patient had a PCN reaction that required hospitalization  Has patient had a PCN reaction occurring within the last 10 years:If all of the above answers are "NO", then may proceed with Cephalosporin   . Zithromax  Hives    z-max; states can take Z pack    Review of Systems: All  systems reviewed and negative except where noted in HPI.    Physical Exam: BP (!) 144/76 (BP Location: Left Arm, Patient Position: Sitting, Cuff Size: Normal)   Pulse 84   Ht 4\' 11"  (1.499 m) Comment: height measured without shoes  Wt 178 lb 2 oz (80.8 kg)   BMI 35.98 kg/m  Constitutional:  Well-developed, Asian female in no acute distress. Psychiatric: Normal mood and affect. Behavior is normal. HEENT: Normocephalic and atraumatic. Conjunctivae are normal. No scleral icterus. Neck supple.  Cardiovascular: Normal rate, regular rhythm.  Pulmonary/chest: Effort normal and breath sounds normal. No wheezing, rales or rhonchi. Abdominal: Soft, nondistended, nontender. Bowel sounds active throughout. There are no masses palpable. No hepatomegaly. Extremities: no edema Lymphadenopathy: No cervical adenopathy noted. Neurological: Alert and oriented to person place and time. Skin: Skin is warm and dry. No rashes noted.   ASSESSMENT AND PLAN:  1. 55 yo female with two month history of solid food dysphagia, especially to rice. No weight loss or other alarming features. - Patient needs EGD with possible dilation. Need to discuss timing with her primary GI  Dr. Henrene Pastor since his schedule is full.  -In the interim advised patient to eat small bites, chew well with liquids in between each bite to avoid food impaction.  2. Hx of adenomatous colon polyps Nov 2014. Scheduled for surveillance colonoscopy in January.   3. Chest pain, none today. Hospitalized a couple of weeks ago. Normal cardiac enzymes. No evidence for coronary event per Cardiology. She had a normal cath in 2012.   4. Uncontrolled DM, Hgb A1c 10   Tye Savoy  04/28/2016, 2:14 PM   Lucretia Kern, DO

## 2016-04-28 NOTE — Patient Instructions (Signed)
We will call you concerning your appointment for an EGD with Dr. Henrene Pastor.

## 2016-04-29 ENCOUNTER — Encounter: Payer: Self-pay | Admitting: Endocrinology

## 2016-04-29 ENCOUNTER — Ambulatory Visit (INDEPENDENT_AMBULATORY_CARE_PROVIDER_SITE_OTHER): Payer: BLUE CROSS/BLUE SHIELD | Admitting: Endocrinology

## 2016-04-29 VITALS — BP 130/80 | HR 87 | Ht <= 58 in | Wt 176.2 lb

## 2016-04-29 DIAGNOSIS — E113299 Type 2 diabetes mellitus with mild nonproliferative diabetic retinopathy without macular edema, unspecified eye: Secondary | ICD-10-CM | POA: Diagnosis not present

## 2016-04-29 LAB — POCT GLYCOSYLATED HEMOGLOBIN (HGB A1C): Hemoglobin A1C: 10.2

## 2016-04-29 MED ORDER — INSULIN GLARGINE 100 UNIT/ML SOLOSTAR PEN: 170.0000 [IU] | PEN_INJECTOR | SUBCUTANEOUS | 3 refills | Status: DC

## 2016-04-29 MED ORDER — CANAGLIFLOZIN 300 MG PO TABS: 300.0000 mg | ORAL_TABLET | Freq: Every day | ORAL | 6 refills | Status: DC

## 2016-04-29 NOTE — Progress Notes (Signed)
Subjective:    Patient ID: Renee Lam, female    DOB: [birth year], 55 y.o.   MRN: [id number]  HPI Pt returns for f/u of diabetes mellitus: DM type: Insulin-requiring type 2 Dx'ed: [id number] Complications: polyneuropathy and retinopathy.  Therapy: insulin since 2010 GDM: 1996 and 1999 DKA: never Severe hypoglycemia: never Pancreatitis: never Other: she takes multiple daily injections.   Interval history: She says she has been missing her insulin, due to not being able to afford copays.  She says cbg's have been highs.  pt states she feels well in general, except for fatigue. Past Medical History:  Diagnosis Date  . Allergy   . Arthritis   . Asthma   . Chicken pox   . Diabetes mellitus   . Finger infection 2008   isolation  . GERD (gastroesophageal reflux disease)   . Hyperlipidemia   . Hypertension   . Migraine   . Pericarditis   . Viral meningitis     Past Surgical History:  Procedure Laterality Date  . BREAST SURGERY  2002   breast bx  . CARDIAC CATHETERIZATION  2012  . CESAREAN SECTION     1997, 2000  . KNEE ARTHROSCOPY  2010   right  . ORIF HUMERUS FRACTURE Right 03/25/2015   Procedure: OPEN REDUCTION INTERNAL FIXATION (ORIF) DISTAL HUMERUS FRACTURE;  Surgeon: Renette Butters, MD;  Location: [facility];  Service: Orthopedics;  Laterality: Right;    Social History   Social History  . Marital status: Divorced    Spouse name: N/A  . Number of children: N/A  . Years of education: N/A   Occupational History  . Not on file.   Social History Main Topics  . Smoking status: Never Smoker  . Smokeless tobacco: Never Used  . Alcohol use Yes     Comment: once a year  . Drug use: No  . Sexual activity: Yes    Birth control/ protection: Post-menopausal   Other Topics Concern  . Not on file   Social History Narrative  . No narrative on file    Current Outpatient Prescriptions on File Prior to Visit  Medication Sig Dispense Refill  .  albuterol (PROVENTIL HFA;VENTOLIN HFA) 108 (90 Base) MCG/ACT inhaler Inhale 2 puffs into the lungs every 4 (four) hours as needed. For shortness of breath/wheezing 1 Inhaler 12  . atorvastatin (LIPITOR) 80 MG tablet Take 1 tablet (80 mg total) by mouth at bedtime. 90 tablet 3  . diphenhydrAMINE (BENADRYL) 25 MG tablet Take 25 mg by mouth every 6 (six) hours as needed for allergies.     Marland Kitchen EPINEPHrine 0.3 mg/0.3 mL IJ SOAJ injection Inject 0.3 mLs (0.3 mg total) into the muscle once. 1 Device 1   No current facility-administered medications on file prior to visit.       Family History  Problem Relation Age of Onset  . Colon polyps Mother   . Diabetes Mother   . Heart failure Mother   . Diabetes Father   . Heart disease Father   . Arthritis Father   . Hypertension Father   . Heart failure Father   . Arthritis Other   . Cancer Other     colon,ovary/uterus,breast,prostate  . Hyperlipidemia Other   . Heart disease Other   . Hypertension Other   . Depression Other   . Colon cancer Neg Hx   . Stomach cancer Neg Hx     BP 130/80   Pulse 87   Ht 4'  10" (1.473 m)   Wt 176 lb 4 oz (79.9 kg)   SpO2 97%   BMI 36.84 kg/m   Review of Systems No recent hypoglycemia.      Objective:   Physical Exam VITAL SIGNS:  See vs page GENERAL: no distress Pulses: dorsalis pedis intact bilat.   MSK: no deformity of the feet CV: no leg edema. Skin:  no ulcer on the feet.  normal color and temp on the feet. Neuro: sensation is intact to touch on the feet.    A1c=10.2%    Assessment & Plan:  Insulin-requiring type 2 DM, with retinopathy: worse.   Colon cancer screening: she needs to adjust insulin for this.  Noncompliance with cbg recording and insulin.    Patient is advised the following: Patient Instructions  check your blood sugar twice a day.  vary the time of day when you check, between before the 3 meals, and at bedtime.  also check if you have symptoms of your blood sugar being  too high or too low.  please keep a record of the readings and bring it to your next appointment here.  You can write it on any piece of paper.  please call us sooner if your blood sugar goes below 70, or if you have a lot of readings over 200.   Please stop takingthe novolog, and:  Please increase the lantus to 170 units each morning.  I have sent a prescription to your pharmacy.   The day before your colonoscopy, take just 50 units of lantus.   On the day of colonoscopy, skip the lantus.  The day after, you can resume the usual amount.   Please come back for a follow-up appointment in 3 months.

## 2016-04-29 NOTE — Patient Instructions (Addendum)
check your blood sugar twice a day.  vary the time of day when you check, between before the 3 meals, and at bedtime.  also check if you have symptoms of your blood sugar being too high or too low.  please keep a record of the readings and bring it to your next appointment here.  You can write it on any piece of paper.  please call us sooner if your blood sugar goes below 70, or if you have a lot of readings over 200.   Please stop takingthe novolog, and:  Please increase the lantus to 170 units each morning.  I have sent a prescription to your pharmacy.   The day before your colonoscopy, take just 50 units of lantus.   On the day of colonoscopy, skip the lantus.  The day after, you can resume the usual amount.   Please come back for a follow-up appointment in 3 months.

## 2016-04-30 NOTE — Progress Notes (Signed)
I have openings on Monday and Tuesday for EGD

## 2016-05-03 ENCOUNTER — Telehealth: Payer: Self-pay | Admitting: Interventional Cardiology

## 2016-05-03 ENCOUNTER — Telehealth: Payer: Self-pay

## 2016-05-03 DIAGNOSIS — R1310 Dysphagia, unspecified: Secondary | ICD-10-CM

## 2016-05-03 NOTE — Telephone Encounter (Signed)
New Message:     Pt is schedule to have an Endo tomorrow and a sleep study tomorrow night. Pt wants to know if Dr Tamala Julian think having both test the same day too much? If she have to choose one,she rather have sleep study.

## 2016-05-03 NOTE — Telephone Encounter (Signed)
Spoke with Dr. Tamala Julian and he said ok to do both in the same day.  Informed pt of info provided by Dr. Tamala Julian.  Pt verbalized understanding and was appreciative for assistance.

## 2016-05-03 NOTE — Telephone Encounter (Signed)
Pt scheduled for EGD with dil tomorrow at 11am. Pt knows to arrive here at 10am for the 11am appt and knows to be NPO after midnight.

## 2016-05-04 ENCOUNTER — Encounter: Payer: Self-pay | Admitting: Internal Medicine

## 2016-05-04 ENCOUNTER — Ambulatory Visit (HOSPITAL_BASED_OUTPATIENT_CLINIC_OR_DEPARTMENT_OTHER): Payer: BLUE CROSS/BLUE SHIELD | Attending: Cardiology | Admitting: Cardiology

## 2016-05-04 ENCOUNTER — Ambulatory Visit (AMBULATORY_SURGERY_CENTER): Payer: BLUE CROSS/BLUE SHIELD | Admitting: Internal Medicine

## 2016-05-04 VITALS — BP 138/72 | HR 77 | Temp 97.5°F | Resp 20 | Ht <= 58 in | Wt 176.0 lb

## 2016-05-04 VITALS — Ht <= 58 in | Wt 181.0 lb

## 2016-05-04 DIAGNOSIS — R0683 Snoring: Secondary | ICD-10-CM | POA: Diagnosis not present

## 2016-05-04 DIAGNOSIS — R002 Palpitations: Secondary | ICD-10-CM | POA: Insufficient documentation

## 2016-05-04 DIAGNOSIS — R1310 Dysphagia, unspecified: Secondary | ICD-10-CM

## 2016-05-04 DIAGNOSIS — Z79899 Other long term (current) drug therapy: Secondary | ICD-10-CM | POA: Diagnosis not present

## 2016-05-04 DIAGNOSIS — G4736 Sleep related hypoventilation in conditions classified elsewhere: Secondary | ICD-10-CM | POA: Insufficient documentation

## 2016-05-04 DIAGNOSIS — G4733 Obstructive sleep apnea (adult) (pediatric): Secondary | ICD-10-CM

## 2016-05-04 DIAGNOSIS — K222 Esophageal obstruction: Secondary | ICD-10-CM | POA: Diagnosis not present

## 2016-05-04 LAB — GLUCOSE, CAPILLARY
Glucose-Capillary: 123 mg/dL — ABNORMAL HIGH (ref 65–99)
Glucose-Capillary: 105 mg/dL — ABNORMAL HIGH (ref 65–99)

## 2016-05-04 MED ORDER — SODIUM CHLORIDE 0.9 % IV SOLN: 500.0000 mL | INTRAVENOUS | Status: DC

## 2016-05-04 NOTE — Op Note (Signed)
[facility] Patient Name: Renee Lam Procedure Date: 05/04/2016 10:30 AM MRN: [id number] Endoscopist: Docia Chuck. Henrene Pastor , MD Age: 55 Referring MD:  Date of Birth: [birth year] Gender: Female Account #: [id number] Procedure:                Upper GI endoscopy with [facility] dilation 90 F Indications:              Dysphagia. Intermittent solid for several months.                            No weight loss Medicines:                Monitored Anesthesia Care Procedure:                Pre-Anesthesia Assessment:                           - Prior to the procedure, a History and Physical                            was performed, and patient medications and                            allergies were reviewed. The patient's tolerance of                            previous anesthesia was also reviewed. The risks                            and benefits of the procedure and the sedation                            options and risks were discussed with the patient.                            All questions were answered, and informed consent                            was obtained. Prior Anticoagulants: The patient has                            taken no previous anticoagulant or antiplatelet                            agents. ASA Grade Assessment: II - A patient with                            mild systemic disease. After reviewing the risks                            and benefits, the patient was deemed in                            satisfactory condition to undergo the procedure.  After obtaining informed consent, the endoscope was                            passed under direct vision. Throughout the                            procedure, the patient's blood pressure, pulse, and                            oxygen saturations were monitored continuously. The                            Model GIF-[id number]) scope was introduced                            through the  mouth, and advanced to the second part                            of duodenum. The upper GI endoscopy was                            accomplished without difficulty. The patient                            tolerated the procedure well. Scope In: Scope Out: Findings:                 No obvious stricture was found. The scope was                            withdrawn after the endoscopic survey completed.                            Empiric Dilation was performed with a Maloney                            dilator with no resistance at 20 Fr.                           The exam of the esophagus was otherwise normal.                           The stomach was normal.                           The examined duodenum was normal.                           The cardia and gastric fundus were normal on                            retroflexion. Complications:            No immediate complications. Estimated Blood Loss:     Estimated blood loss: none. Impression:               -  Esophageal dysphagia without obvious stenosis.                            Dilated.                           - Normal stomach.                           - Normal examined duodenum.                           - No specimens collected. Recommendation:           - Patient has a contact number available for                            emergencies. The signs and symptoms of potential                            delayed complications were discussed with the                            patient. Return to normal activities tomorrow.                            Written discharge instructions were provided to the                            patient.                           - Standard post-dilation diet.                           - Continue present medications.                           - Office follow up with Dr. Henrene Pastor in 8 weeks Docia Chuck. Henrene Pastor, MD 05/04/2016 11:03:23 AM This report has been signed electronically.

## 2016-05-04 NOTE — Progress Notes (Signed)
Called to room to assist during endoscopic procedure.  Patient ID and intended procedure confirmed with present staff. Received instructions for my participation in the procedure from the performing physician.  

## 2016-05-04 NOTE — Progress Notes (Signed)
Left message on patient's voicemail.

## 2016-05-04 NOTE — Progress Notes (Signed)
No problems noted in the recovery room. maw 

## 2016-05-04 NOTE — Patient Instructions (Addendum)
YOU HAD AN ENDOSCOPIC PROCEDURE TODAY AT [facility]:   Refer to the procedure report that was given to you for any specific questions about what was found during the examination.  If the procedure report does not answer your questions, please call your gastroenterologist to clarify.  If you requested that your care partner not be given the details of your procedure findings, then the procedure report has been included in a sealed envelope for you to review at your convenience later.  YOU SHOULD EXPECT: Some feelings of bloating in the abdomen. Passage of more gas than usual.  Walking can help get rid of the air that was put into your GI tract during the procedure and reduce the bloating. If you had a lower endoscopy (such as a colonoscopy or flexible sigmoidoscopy) you may notice spotting of blood in your stool or on the toilet paper. If you underwent a bowel prep for your procedure, you may not have a normal bowel movement for a few days.  Please Note:  You might notice some irritation and congestion in your nose or some drainage.  This is from the oxygen used during your procedure.  There is no need for concern and it should clear up in a day or so.  SYMPTOMS TO REPORT IMMEDIATELY:   Following lower endoscopy (colonoscopy or flexible sigmoidoscopy):  Excessive amounts of blood in the stool  Significant tenderness or worsening of abdominal pains  Swelling of the abdomen that is new, acute  Fever of 100F or higher   Following upper endoscopy (EGD)  Vomiting of blood or coffee ground material  New chest pain or pain under the shoulder blades  Painful or persistently difficult swallowing  New shortness of breath  Fever of 100F or higher  Black, tarry-looking stools  For urgent or emergent issues, a gastroenterologist can be reached at any hour by calling [phone].   DIET:  Please follow the dilatation diet the rest of the day.  Handout was provided.  Drink plenty of  fluids but you should avoid alcoholic beverages for 24 hours.  ACTIVITY:  You should plan to take it easy for the rest of today and you should NOT DRIVE or use heavy machinery until tomorrow (because of the sedation medicines used during the test).    FOLLOW UP: Our staff will call the number listed on your records the next business day following your procedure to check on you and address any questions or concerns that you may have regarding the information given to you following your procedure. If we do not reach you, we will leave a message.  However, if you are feeling well and you are not experiencing any problems, there is no need to return our call.  We will assume that you have returned to your regular daily activities without incident.  If any biopsies were taken you will be contacted by phone or by letter within the next 1-3 weeks.  Please call us at [phone] if you have not heard about the biopsies in 3 weeks.    SIGNATURES/CONFIDENTIALITY: You and/or your care partner have signed paperwork which will be entered into your electronic medical record.  These signatures attest to the fact that that the information above on your After Visit Summary has been reviewed and is understood.  Full responsibility of the confidentiality of this discharge information lies with you and/or your care-partner.   Handouts were given to your care partner on the dilatation diet to follow  the rest of today.  Handout was given to your care partner. You may resume your current medications today. Your blood sugar was 105 in the recovery room. Office follow up in 8 weeks with Dr. Henrene Pastor. Please call if any questions or concerns.

## 2016-05-04 NOTE — Progress Notes (Signed)
Report given to PACU RN, vss 

## 2016-05-04 NOTE — Progress Notes (Signed)
Procedure was scheduled for today by Vaughan Basta, Dr. Blanch Media nurse.

## 2016-05-05 ENCOUNTER — Telehealth: Payer: Self-pay

## 2016-05-05 ENCOUNTER — Telehealth: Payer: Self-pay | Admitting: *Deleted

## 2016-05-05 NOTE — Telephone Encounter (Signed)
Number identifier, left message, follow-up  

## 2016-05-05 NOTE — Telephone Encounter (Signed)
  Follow up Call-  Call back number 05/04/2016  Post procedure Call Back phone  # [phone]  Permission to leave phone message Yes  Some recent data might be hidden     Patient questions:  Do you have a fever, pain , or abdominal swelling? No. Pain Score  0 *  Have you tolerated food without any problems? Yes.    Have you been able to return to your normal activities? Yes.    Do you have any questions about your discharge instructions: Diet   No. Medications  No. Follow up visit  No.  Do you have questions or concerns about your Care? No.  Actions: * If pain score is 4 or above: No action needed, pain <4.

## 2016-05-17 ENCOUNTER — Telehealth: Payer: Self-pay | Admitting: Interventional Cardiology

## 2016-05-17 ENCOUNTER — Telehealth: Payer: Self-pay | Admitting: Cardiology

## 2016-05-17 NOTE — Telephone Encounter (Signed)
Spoke to the patient and she verbalized understanding that I wiil f/u with her when her results are in

## 2016-05-17 NOTE — Telephone Encounter (Signed)
New Message  Pt voiced she is calling to get her results from her sleep study.  Please f/u with pt

## 2016-05-21 ENCOUNTER — Telehealth: Payer: Self-pay | Admitting: Endocrinology

## 2016-05-21 MED ORDER — INSULIN GLARGINE 100 UNIT/ML SOLOSTAR PEN: 190.0000 [IU] | PEN_INJECTOR | SUBCUTANEOUS | 3 refills | Status: DC

## 2016-05-21 NOTE — Telephone Encounter (Signed)
Please increase the lantus to 190 units each morning. Even if you check cbg once a day, it is best to skip around to different times of day.  That way you just have to check once a day, but you get more information.

## 2016-05-21 NOTE — Telephone Encounter (Signed)
Renee Lam is calling to get his sleep apnea results . Please call

## 2016-05-21 NOTE — Telephone Encounter (Signed)
I contacted the patient and advised of message. Patient verbalized understanding on new instructions.

## 2016-05-21 NOTE — Telephone Encounter (Signed)
Patient ask if she can up the dosage of her Insulin Glargine (LANTUS SOLOSTAR) 100 UNIT/ML Solostar [place].  b/s is still running slightly high. Please advise

## 2016-05-21 NOTE — Telephone Encounter (Signed)
Spoke to the patient and explained that her results have not been read yet ,she verbalized understanding.

## 2016-05-21 NOTE — Telephone Encounter (Signed)
See message. I contacted the patient and she stated she is currently using 170 units of lantus in the morning and her average fasting blood sugars have been between 135 and 140. Patient stated she does not check her blood sugar more than one time a day and she always checks her fasting reading.  Please advise, Thanks!

## 2016-05-25 ENCOUNTER — Ambulatory Visit: Payer: BLUE CROSS/BLUE SHIELD | Admitting: Internal Medicine

## 2016-05-28 ENCOUNTER — Telehealth: Payer: Self-pay | Admitting: Cardiology

## 2016-05-28 NOTE — Telephone Encounter (Signed)
Renee Lam is calling to get her test results of he sleep study

## 2016-05-30 NOTE — Procedures (Signed)
Patient Name: Renee Lam, Renee Lam Date: 05/04/2016 Gender: Female D.O.B: 01-09-1961 Age (years): 54 Referring Provider: Fransico Him MD, ABSM Height (inches): 58 Interpreting Physician: Fransico Him MD, ABSM Weight (lbs): 177 RPSGT: [place], Holly BMI: 37 MRN: [id number] Neck Size: 15.5  CLINICAL INFORMATION Sleep Study Type: Split Night CPAP Indication for sleep study: OSA, Snoring Epworth Sleepiness Score:18  SLEEP STUDY TECHNIQUE As per the AASM Manual for the Scoring of Sleep and Associated Events v2.3 (April 2016) with a hypopnea requiring 4% desaturations.  The channels recorded and monitored were frontal, central and occipital EEG, electrooculogram (EOG), submentalis EMG (chin), nasal and oral airflow, thoracic and abdominal wall motion, anterior tibialis EMG, snore microphone, electrocardiogram, and pulse oximetry. Continuous positive airway pressure (CPAP) was initiated when the patient met split night criteria and was titrated according to treat sleep-disordered breathing.  MEDICATIONS Medications self-administered by patient taken the night of the study : ATORVASTATIN  RESPIRATORY PARAMETERS Diagnostic Total AHI (/hr): 46.8  RDI (/hr):51.3  OA Index (/hr): 32  CA Index (/hr): 0.0 REM AHI (/hr): 83.6  NREM AHI (/hr):42.1 Supine AHI (/hr):47.5  Non-supine AHI (/hr):46.17 Min O2 Sat (%):72.00  Mean O2 (%):94.52 Time below 88% (min):7.6   Titration Optimal Pressure (cm):11  AHI at Optimal Pressure (/hr):0.0  Min O2 at Optimal Pressure (%):94.0 Supine % at Optimal (%):90  Sleep % at Optimal (%):86    SLEEP ARCHITECTURE The recording time for the entire night was 380.4 minutes.  During a baseline period of 181.8 minutes, the patient slept for 146.1 minutes in REM and nonREM, yielding a sleep efficiency of 80.3%. Sleep onset after lights out was 3.7 minutes with a REM latency of 130.5 minutes. The patient spent 5.13% of the night in stage N1 sleep, 81.52% in  stage N2 sleep, 2.05% in stage N3 and 11.30% in REM.  During the titration period of 193.0 minutes, the patient slept for 166.1 minutes in REM and nonREM, yielding a sleep efficiency of 86.1%. Sleep onset after CPAP initiation was 11.9 minutes with a REM latency of 49.5 minutes. The patient spent 3.31% of the night in stage N1 sleep, 75.02% in stage N2 sleep, 0.00% in stage N3 and 21.67% in REM.  CARDIAC DATA The 2 lead EKG demonstrated sinus rhythm. The mean heart rate was 80.97 beats per minute. Other EKG findings include: None.  LEG MOVEMENT DATA The total Periodic Limb Movements of Sleep (PLMS) were 0. The PLMS index was 0.00 .  IMPRESSIONS - Severe obstructive sleep apnea occurred during the diagnostic portion of the study (AHI = 46.8/hour). An optimal PAP pressure was selected for this patient ( 11 cm of water) - No significant central sleep apnea occurred during the diagnostic portion of the study (CAI = 0.0/hour). - The patient had oxygen desaturation during the diagnostic portion of the study (Min O2 = 72.00%) - The patient snored with Loud snoring volume during the diagnostic portion of the study. - No cardiac abnormalities were noted during this study. - Clinically significant periodic limb movements did not occur during sleep.  DIAGNOSIS - Obstructive Sleep Apnea (327.23 [G47.33 ICD-10]) - Nocturnal Hypoxemia  RECOMMENDATIONS - Trial of CPAP therapy on 11 cm H2O with a Medium size Resmed Nasal Mask Airfit N20 mask and heated humidification. - Avoid alcohol, sedatives and other CNS depressants that may worsen sleep apnea and disrupt normal sleep architecture. - Sleep hygiene should be reviewed to assess factors that may improve sleep quality. - Weight management and regular exercise should be initiated  or continued. - Return to Sleep Center for re-evaluation after 10 weeks of therapy  [place] [facility]  ELECTRONICALLY SIGNED ON:   05/30/2016, 3:26 PM [place] PH: [phone]   FX: [phone] [place]

## 2016-06-01 ENCOUNTER — Ambulatory Visit (INDEPENDENT_AMBULATORY_CARE_PROVIDER_SITE_OTHER): Payer: BLUE CROSS/BLUE SHIELD | Admitting: Family Medicine

## 2016-06-01 ENCOUNTER — Telehealth: Payer: Self-pay | Admitting: *Deleted

## 2016-06-01 ENCOUNTER — Encounter: Payer: Self-pay | Admitting: Family Medicine

## 2016-06-01 VITALS — BP 119/81 | HR 88 | Temp 98.5°F | Resp 20 | Wt 180.8 lb

## 2016-06-01 DIAGNOSIS — R062 Wheezing: Secondary | ICD-10-CM

## 2016-06-01 DIAGNOSIS — J9801 Acute bronchospasm: Secondary | ICD-10-CM

## 2016-06-01 DIAGNOSIS — J4540 Moderate persistent asthma, uncomplicated: Secondary | ICD-10-CM | POA: Diagnosis not present

## 2016-06-01 MED ORDER — ALBUTEROL SULFATE HFA 108 (90 BASE) MCG/ACT IN AERS: 2.0000 | INHALATION_SPRAY | RESPIRATORY_TRACT | 12 refills | Status: DC | PRN

## 2016-06-01 MED ORDER — MONTELUKAST SODIUM 10 MG PO TABS: 10.0000 mg | ORAL_TABLET | Freq: Every day | ORAL | 3 refills | Status: DC

## 2016-06-01 MED ORDER — FLUTICASONE-SALMETEROL 250-50 MCG/DOSE IN AEPB: 1.0000 | INHALATION_SPRAY | Freq: Two times a day (BID) | RESPIRATORY_TRACT | 11 refills | Status: DC

## 2016-06-01 MED ORDER — IPRATROPIUM-ALBUTEROL 0.5-2.5 (3) MG/3ML IN SOLN
3.0000 mL | Freq: Once | RESPIRATORY_TRACT | Status: AC
  Administered 2016-06-01: 3 mL via RESPIRATORY_TRACT

## 2016-06-01 NOTE — Telephone Encounter (Signed)
Follow Up:    Pt is calling again,she still have not received her sleep study results.

## 2016-06-01 NOTE — Progress Notes (Signed)
Spoke to the patient about her results she verbalized understanding 

## 2016-06-01 NOTE — Telephone Encounter (Signed)
Spoke to the patient and verbalized understanding

## 2016-06-01 NOTE — Patient Instructions (Signed)
This appears to be related to your allergies an asthma.  Restart allergy regimen (zyrtec and singular daily) Restart advair as scheduled twice a day.  Albuterol use for tightness, wheeze or shortness of breath only. If you are requiring to use this more than twice a week, please see your PCP.    Asthma, Acute Bronchospasm Acute bronchospasm caused by asthma is also referred to as an asthma attack. Bronchospasm means your air passages become narrowed. The narrowing is caused by inflammation and tightening of the muscles in the air tubes (bronchi) in your lungs. This can make it hard to breathe or cause you to wheeze and cough. What are the causes? Possible triggers are:  Animal dander from the skin, hair, or feathers of animals.  Dust mites contained in house dust.  Cockroaches.  Pollen from trees or grass.  Mold.  Cigarette or tobacco smoke.  Air pollutants such as dust, household cleaners, hair sprays, aerosol sprays, paint fumes, strong chemicals, or strong odors.  Cold air or weather changes. Cold air may trigger inflammation. Winds increase molds and pollens in the air.  Strong emotions such as crying or laughing hard.  Stress.  Certain medicines such as aspirin or beta-blockers.  Sulfites in foods and drinks, such as dried fruits and wine.  Infections or inflammatory conditions, such as a flu, cold, or inflammation of the nasal membranes (rhinitis).  Gastroesophageal reflux disease (GERD). GERD is a condition where stomach acid backs up into your esophagus.  Exercise or strenuous activity. What are the signs or symptoms?  Wheezing.  Excessive coughing, particularly at night.  Chest tightness.  Shortness of breath. How is this diagnosed? Your health care provider will ask you about your medical history and perform a physical exam. A chest X-ray or blood testing may be performed to look for other causes of your symptoms or other conditions that may have triggered  your asthma attack. How is this treated? Treatment is aimed at reducing inflammation and opening up the airways in your lungs. Most asthma attacks are treated with inhaled medicines. These include quick relief or rescue medicines (such as bronchodilators) and controller medicines (such as inhaled corticosteroids). These medicines are sometimes given through an inhaler or a nebulizer. Systemic steroid medicine taken by mouth or given through an IV tube also can be used to reduce the inflammation when an attack is moderate or severe. Antibiotic medicines are only used if a bacterial infection is present. Follow these instructions at home:  Rest.  Drink plenty of liquids. This helps the mucus to remain thin and be easily coughed up. Only use caffeine in moderation and do not use alcohol until you have recovered from your illness.  Do not smoke. Avoid being exposed to secondhand smoke.  You play a critical role in keeping yourself in good health. Avoid exposure to things that cause you to wheeze or to have breathing problems.  Keep your medicines up-to-date and available. Carefully follow your health care provider's treatment plan.  Take your medicine exactly as prescribed.  When pollen or pollution is bad, keep windows closed and use an air conditioner or go to places with air conditioning.  Asthma requires careful medical care. See your health care provider for a follow-up as advised. If you are more than [redacted] weeks pregnant and you were prescribed any new medicines, let your obstetrician know about the visit and how you are doing. Follow up with your health care provider as directed.  After you have recovered from your  asthma attack, make an appointment with your outpatient doctor to talk about ways to reduce the likelihood of future attacks. If you do not have a doctor who manages your asthma, make an appointment with a primary care doctor to discuss your asthma. Get help right away if:  You  are getting worse.  You have trouble breathing. If severe, call your local emergency services (911 in the [place]  You develop chest pain or discomfort.  You are vomiting.  You are not able to keep fluids down.  You are coughing up yellow, green, brown, or bloody sputum.  You have a fever and your symptoms suddenly get worse.  You have trouble swallowing. This information is not intended to replace advice given to you by your health care provider. Make sure you discuss any questions you have with your health care provider. Document Released: 09/15/2006 Document Revised: 11/12/2015 Document Reviewed: 12/06/2012 Elsevier Interactive Patient Education  2017 [employer].

## 2016-06-01 NOTE — Progress Notes (Signed)
Renee Lam , Dec 07, 1960, 54 y.o., female MRN: [id number] Patient Care Team    Relationship Specialty Notifications Start End  Marletta Lor, MD PCP - General Internal Medicine  06/18/11   Belva Crome, MD Attending Physician Cardiology  06/18/11   Olga Millers, MD Attending Physician Obstetrics and Gynecology  06/16/12     CC: Chronic cough Subjective: Pt presents for an acute OV with complaints of Chronic cough of 3 months duration.  Associated symptoms include nonproductive cough, nasal congestion/headache with cough. She states she coughed so bad once, that she vomited after. Nothing seems to make cough better or worse. She endorses chest tightness. She had been seen in the ED , PCP and GI over the last three months for chest tightness, dyspragia symptoms and cough. Her EGD was normal, by Dr. Henrene Pastor. She has no history of GERD and denies heartburn. She has a h/o asthma. She states she has an old albuterol inhaler, she has been using it but does not feel it works. She use to be prescribed singulair and advair for her asthma, but has not used it about 5-7 years. She does feel occasional wheezing.    Cxr: 04/07/2016 COMPARISON:  03/20/2015  FINDINGS: The heart size and mediastinal contours are within normal limits. Both lungs are clear. Slightly low lung volumes with crowding of interstitial lung markings similar to prior study. There is minimal scarring at the left lung base. The The visualized skeletal structures are unremarkable.  IMPRESSION: No active cardiopulmonary disease.     Social History  Substance Use Topics  . Smoking status: Never Smoker  . Smokeless tobacco: Never Used  . Alcohol use Yes     Comment: once a year   Past Medical History:  Diagnosis Date  . Allergy   . Arthritis   . Asthma   . Chicken pox   . Diabetes mellitus   . Finger infection 2008   isolation  . GERD (gastroesophageal reflux disease)   . Heart murmur    benign per  patient  . Hyperlipidemia   . Hypertension   . Migraine   . Osteoporosis   . Pericarditis   . Sleep apnea    Patient is scheduled for sleep study tonight  . Viral meningitis    Past Surgical History:  Procedure Laterality Date  . CARDIAC CATHETERIZATION  2012  . CESAREAN SECTION     1997, 2000  . KNEE ARTHROSCOPY  2010   right  . ORIF HUMERUS FRACTURE Right 03/25/2015   Procedure: OPEN REDUCTION INTERNAL FIXATION (ORIF) DISTAL HUMERUS FRACTURE;  Surgeon: Renette Butters, MD;  Location: [facility];  Service: Orthopedics;  Laterality: Right;   Family History  Problem Relation Age of Onset  . Colon polyps Mother   . Diabetes Mother   . Heart failure Mother   . Diabetes Father   . Heart disease Father   . Arthritis Father   . Hypertension Father   . Heart failure Father   . Arthritis Other   . Cancer Other     colon,ovary/uterus,breast,prostate  . Hyperlipidemia Other   . Heart disease Other   . Hypertension Other   . Depression Other   . Colon cancer Neg Hx   . Stomach cancer Neg Hx   . Rectal cancer Neg Hx   . Pancreatic cancer Neg Hx     No results found for this or any previous visit (from the past 24 hour(s)). No results found.  ROS: Negative, with the exception of above mentioned in HPI   Objective:  BP 119/81 (BP Location: Left Arm, Patient Position: Sitting, Cuff Size: Large)   Pulse 88   Temp 98.5 F (36.9 C)   Resp 20   Wt 180 lb 12 oz (82 kg)   SpO2 97%   BMI 37.78 kg/m  Body mass index is 37.78 kg/m. Gen: Afebrile. No acute distress. Nontoxic in appearance, well developed, well nourished. Persistent cough present.  HENT: AT. [place]. Bilateral TM visualized mild air fluid levels. MMM, no oral lesions. Bilateral nares with mild erythema, no swelling. Throat without erythema or exudates. PND present, cough.  Eyes:Pupils Equal Round Reactive to light, Extraocular movements intact,  Conjunctiva without redness, discharge or  icterus. Neck/lymp/endocrine: Supple,no lymphadenopathy CV: RRR  Chest: CTAB, no crackles. Mild diminished air movement. Very mild wheeze.   Assessment/Plan: Renee Lam is a 55 y.o. female present for acute OV for  Moderate persistent asthma without complication/Wheezing mild/Bronchospasm - duoneb treatment in OV cleared cough and opened airways nicely. Her cough has been > 3 months and she has a h/o uncontrolled DM, would like to avoid prednisone. Discussed with her this appears to be her asthma and allergies causing her symptoms.  - Restart advair, Singulair and OTC antihistamine. Albuterol every 6 hr PRN only if wheezing or chest tightness. If needing more than 2x a week she needs to see her PCP for f/u.  - montelukast (SINGULAIR) 10 MG tablet; Take 1 tablet (10 mg total) by mouth at bedtime.  Dispense: 30 tablet; Refill: 3 - Fluticasone-Salmeterol (ADVAIR DISKUS) 250-50 MCG/DOSE AEPB; Inhale 1 puff into the lungs 2 (two) times daily.  Dispense: 60 each; Refill: 11 - albuterol (PROVENTIL HFA;VENTOLIN HFA) 108 (90 Base) MCG/ACT inhaler; Inhale 2 puffs into the lungs every 4 (four) hours as needed. For shortness of breath/wheezing  Dispense: 1 Inhaler; Refill: 12 -  ipratropium-albuterol (DUONEB) 0.5-2.5 (3) MG/3ML nebulizer solution 3 mL; Take 3 mLs by nebulization once. - F/U with PCP in 2 weeks if symptoms return or persist.    electronically signed by:  Howard Pouch, DO  [place]

## 2016-06-01 NOTE — Telephone Encounter (Signed)
-----   Message from Sueanne Margarita, MD sent at 05/30/2016  3:31 PM EST ----- Please let patient know that they have significant sleep apnea and had successful CPAP titration and will be set up with CPAP unit.  Please let DME know that order is in EPIC.  Please set patient up for OV in 10 weeks

## 2016-06-01 NOTE — Telephone Encounter (Signed)
Spoke to the patient she verbalized understanding 

## 2016-06-08 ENCOUNTER — Ambulatory Visit: Payer: BLUE CROSS/BLUE SHIELD | Admitting: Internal Medicine

## 2016-06-10 ENCOUNTER — Telehealth: Payer: Self-pay

## 2016-06-10 NOTE — Telephone Encounter (Signed)
06/10/16 15:55  Returned call to patient who verbalized concern about not receiving her sleep study results until late December (3 weeks after she had the study).  Pt states this delayed her receiving her CPAP machine until after her new insurance becomes effective which does not cover CPAP machines. Patient requested a referral from Dr Tamala Julian to another physician who works with sleep patients.  Message sent to Dr Tamala Julian.  Writer will follow up with patient next week. Georgana Curio MHA RN CCM

## 2016-06-18 ENCOUNTER — Encounter: Payer: Self-pay | Admitting: Internal Medicine

## 2016-06-18 ENCOUNTER — Ambulatory Visit (INDEPENDENT_AMBULATORY_CARE_PROVIDER_SITE_OTHER): Payer: PRIVATE HEALTH INSURANCE | Admitting: Internal Medicine

## 2016-06-18 VITALS — BP 128/74 | HR 100 | Temp 98.4°F | Ht <= 58 in | Wt 181.4 lb

## 2016-06-18 DIAGNOSIS — J4520 Mild intermittent asthma, uncomplicated: Secondary | ICD-10-CM | POA: Diagnosis not present

## 2016-06-18 DIAGNOSIS — E103293 Type 1 diabetes mellitus with mild nonproliferative diabetic retinopathy without macular edema, bilateral: Secondary | ICD-10-CM | POA: Diagnosis not present

## 2016-06-18 DIAGNOSIS — E7800 Pure hypercholesterolemia, unspecified: Secondary | ICD-10-CM

## 2016-06-18 DIAGNOSIS — Z23 Encounter for immunization: Secondary | ICD-10-CM

## 2016-06-18 NOTE — Progress Notes (Signed)
Subjective:    Patient ID: Renee Lam, female    DOB: 1961-04-23, 56 y.o.   MRN: [id number]  HPI 56 year old patient who is seen today for her six-month follow-up. She is followed by endocrinology and due to cost considerations.  Presently is on high-dose basal insulin only.  Lab Results  Component Value Date   HGBA1C 10.2 04/29/2016    More recently, her glycemic control has improved.  She is scheduled for endocrine follow-up in about 2 months. She has a history of asthma, which more recently has been stable.  She remains on maintenance medications.  She has had a recent diagnosis of OSA but has not started CPAP presently  Her main complaint today is a nonhealing lesion involving her left lateral abdominal wall area     Social History   Social History  . Marital status: Divorced    Spouse name: N/A  . Number of children: N/A  . Years of education: N/A   Occupational History  . Not on file.   Social History Main Topics  . Smoking status: Never Smoker  . Smokeless tobacco: Never Used  . Alcohol use Yes     Comment: once a year  . Drug use: No  . Sexual activity: Yes    Birth control/ protection: Post-menopausal   Other Topics Concern  . Not on file   Social History Narrative  . No narrative on file    Past Surgical History:  Procedure Laterality Date  . CARDIAC CATHETERIZATION  2012  . CESAREAN SECTION     1997, 2000  . KNEE ARTHROSCOPY  2010   right  . ORIF HUMERUS FRACTURE Right 03/25/2015   Procedure: OPEN REDUCTION INTERNAL FIXATION (ORIF) DISTAL HUMERUS FRACTURE;  Surgeon: Renette Butters, MD;  Location: [facility];  Service: Orthopedics;  Laterality: Right;    Family History  Problem Relation Age of Onset  . Colon polyps Mother   . Diabetes Mother   . Heart failure Mother   . Diabetes Father   . Heart disease Father   . Arthritis Father   . Hypertension Father   . Heart failure Father   . Arthritis Other   . Cancer  Other     colon,ovary/uterus,breast,prostate  . Hyperlipidemia Other   . Heart disease Other   . Hypertension Other   . Depression Other   . Colon cancer Neg Hx   . Stomach cancer Neg Hx   . Rectal cancer Neg Hx   . Pancreatic cancer Neg Hx       Current Outpatient Prescriptions on File Prior to Visit  Medication Sig Dispense Refill  . albuterol (PROVENTIL HFA;VENTOLIN HFA) 108 (90 Base) MCG/ACT inhaler Inhale 2 puffs into the lungs every 4 (four) hours as needed. For shortness of breath/wheezing 1 Inhaler 12  . atorvastatin (LIPITOR) 80 MG tablet Take 1 tablet (80 mg total) by mouth at bedtime. 90 tablet 3  . BAYER CONTOUR NEXT TEST test strip USE TO TEST FOUR TIMES A DAY AS INSTRUCTED.  12  . BD PEN NEEDLE NANO U/F 32G X 4 MM MISC USE 4 TIMES A DAY AS DIRECTED  12  . diphenhydrAMINE (BENADRYL) 25 MG tablet Take 25 mg by mouth every 6 (six) hours as needed for allergies.     Marland Kitchen EPINEPHrine 0.3 mg/0.3 mL IJ SOAJ injection Inject 0.3 mLs (0.3 mg total) into the muscle once. 1 Device 1  . Fluticasone-Salmeterol (ADVAIR DISKUS) 250-50 MCG/DOSE AEPB Inhale 1 puff into  the lungs 2 (two) times daily. 60 each 11  . Insulin Glargine (LANTUS SOLOSTAR) 100 UNIT/ML Solostar Pen Inject 190 Units into the skin every morning. And pen needles 3/day 20 pen 3  . montelukast (SINGULAIR) 10 MG tablet Take 1 tablet (10 mg total) by mouth at bedtime. 30 tablet 3   Current Facility-Administered Medications on File Prior to Visit  Medication Dose Route Frequency Provider Last Rate Last Dose  . 0.9 %  sodium chloride infusion  500 mL Intravenous Continuous Irene Shipper, MD        BP 128/74 (BP Location: Right Arm, Patient Position: Sitting, Cuff Size: Normal)   Pulse 100   Temp 98.4 F (36.9 C) (Oral)   Ht 4\' 10"  (1.473 m)   Wt 181 lb 6.4 oz (82.3 kg)   SpO2 98%   BMI 37.91 kg/m     Review of Systems  Constitutional: Negative.   HENT: Negative for congestion, dental problem, hearing loss,  rhinorrhea, sinus pressure, sore throat and tinnitus.   Eyes: Negative for pain, discharge and visual disturbance.  Respiratory: Negative for cough and shortness of breath.   Cardiovascular: Negative for chest pain, palpitations and leg swelling.  Gastrointestinal: Negative for abdominal distention, abdominal pain, blood in stool, constipation, diarrhea, nausea and vomiting.  Genitourinary: Negative for difficulty urinating, dysuria, flank pain, frequency, hematuria, pelvic pain, urgency, vaginal bleeding, vaginal discharge and vaginal pain.  Musculoskeletal: Negative for arthralgias, gait problem and joint swelling.  Skin: Positive for rash and wound.  Neurological: Negative for dizziness, syncope, speech difficulty, weakness, numbness and headaches.  Hematological: Negative for adenopathy.  Psychiatric/Behavioral: Negative for agitation, behavioral problems and dysphoric mood. The patient is not nervous/anxious.        Objective:   Physical Exam  Constitutional: She appears well-developed. No distress.  Weight 181 Blood pressure stable  Neck: Normal range of motion.  Cardiovascular: Normal rate and regular rhythm.   Pulmonary/Chest: Effort normal and breath sounds normal. No respiratory distress. She has no wheezes. She has no rales.  Musculoskeletal: She exhibits no edema.  Skin:  Patchy areas of erythema with some dry flaky skin.  Wider area of erythema secondary to bandage          Assessment & Plan:   Dermatitis, left lateral abdominal wall.  This is predominantly related to allergic reaction from adhesive.  Will discontinue Diabetes mellitus.  Follow-up endocrinology OSA.  Patient encouraged to resume CPAP Asthma, stable  Follow-up 6 months or as needed  [name]

## 2016-06-18 NOTE — Telephone Encounter (Signed)
Renee Crome, MD  Renee Moss, RN; Loren Racer, LPN  Phone Number: [phone]        Ellouise Newer referral for OSA    Left message to call back

## 2016-06-18 NOTE — Patient Instructions (Signed)
Limit your sodium (Salt) intake   Please check your hemoglobin A1c every 3 months    It is important that you exercise regularly, at least 20 minutes 3 to 4 times per week.  If you develop chest pain or shortness of breath seek  medical attention.  Please check your blood pressure on a regular basis.  If it is consistently greater than 150/90, please make an office appointment.  Return in 6 months for follow-up  Please check with Dr. Loanne Drilling for any possible dosing changes if Toujeo is substituted for Lantus insulin

## 2016-06-18 NOTE — Progress Notes (Signed)
Pre visit review using our clinic review tool, if applicable. No additional management support is needed unless otherwise documented below in the visit note. 

## 2016-06-23 NOTE — Telephone Encounter (Signed)
I called the patient to inform her of the patient assistant form I have prepared for her to get help with receiving her cpap.  I called her early Wednesday morning and this afternoon. I left 2 messages on her home vm [phone] for her to return my calls. 06/23/2016.

## 2016-06-24 ENCOUNTER — Other Ambulatory Visit: Payer: Self-pay | Admitting: *Deleted

## 2016-06-24 ENCOUNTER — Telehealth: Payer: Self-pay | Admitting: *Deleted

## 2016-06-24 DIAGNOSIS — G4733 Obstructive sleep apnea (adult) (pediatric): Secondary | ICD-10-CM

## 2016-06-24 NOTE — Telephone Encounter (Signed)
This patient was unable to purchase a new cpap which cost $900 through [facility] because she hasn't reached her deductible. She was offered cpap assistance in our office through the ([employer]) the [employer] and she accepted the offer and has already paid her $100 program fee and the form was faxed and received by the [employer] today. The patient called the office today to say thank you after the [employer] called her to say they had received her payment and was shipping her cpap. [facility] will set up the cpap, train her how to use it and how to care for the device. The [employer] does not provide a mask but the patient states she received a mask from the sleep center at the time of her sleep study.

## 2016-06-25 ENCOUNTER — Telehealth: Payer: Self-pay | Admitting: Endocrinology

## 2016-06-25 NOTE — Telephone Encounter (Signed)
Patient stated company in which she gets her Lantus from sent request for vials instead of Lantus pens.  Company will be sending a second request for Lantus pens.

## 2016-06-28 NOTE — Telephone Encounter (Signed)
Noted  

## 2016-07-01 ENCOUNTER — Ambulatory Visit: Payer: BLUE CROSS/BLUE SHIELD | Admitting: Internal Medicine

## 2016-07-12 ENCOUNTER — Encounter: Payer: Self-pay | Admitting: Internal Medicine

## 2016-07-12 ENCOUNTER — Ambulatory Visit (INDEPENDENT_AMBULATORY_CARE_PROVIDER_SITE_OTHER): Payer: PRIVATE HEALTH INSURANCE | Admitting: Internal Medicine

## 2016-07-12 VITALS — BP 108/72 | HR 92 | Ht <= 58 in | Wt 179.6 lb

## 2016-07-12 DIAGNOSIS — R1310 Dysphagia, unspecified: Secondary | ICD-10-CM

## 2016-07-12 NOTE — Patient Instructions (Signed)
You have been scheduled for a Barium Esophogram at [facility] (1st floor of the hospital) on 07/21/2016 at 10:30. Please arrive 15 minutes prior to your appointment for registration. Make certain not to have anything to eat or drink after midnight prior to your test. If you need to reschedule for any reason, please contact radiology at [phone] to do so. __________________________________________________________________ A barium swallow is an examination that concentrates on views of the esophagus. This tends to be a double contrast exam (barium and two liquids which, when combined, create a gas to distend the wall of the oesophagus) or single contrast (non-ionic iodine based). The study is usually tailored to your symptoms so a good history is essential. Attention is paid during the study to the form, structure and configuration of the esophagus, looking for functional disorders (such as aspiration, dysphagia, achalasia, motility and reflux) EXAMINATION You may be asked to change into a gown, depending on the type of swallow being performed. A radiologist and radiographer will perform the procedure. The radiologist will advise you of the type of contrast selected for your procedure and direct you during the exam. You will be asked to stand, sit or lie in several different positions and to hold a small amount of fluid in your mouth before being asked to swallow while the imaging is performed .In some instances you may be asked to swallow barium coated marshmallows to assess the motility of a solid food bolus. The exam can be recorded as a digital or video fluoroscopy procedure. POST PROCEDURE It will take 1-2 days for the barium to pass through your system. To facilitate this, it is important, unless otherwise directed, to increase your fluids for the next 24-48hrs and to resume your normal diet.  This test typically takes about 30 minutes to  perform. __________________________________________________________________________________

## 2016-07-12 NOTE — Progress Notes (Signed)
HISTORY OF PRESENT ILLNESS:  Renee Lam is a 56 y.o. female with history of adenomatous colon polyps who was evaluated by the nurse practitioner [phone] 2017 with complaints of solid food dysphagia, particularly rice. She mentions rice migrating into her nose. No problems with liquids. She underwent upper endoscopy 05/04/2016. Examination was entirely normal. She was dilated empirically with a 54 [place] Maloney dilator. Follow-up at this time. Patient states that symptoms are still present but less noticeable. Again no problems with liquids or liquid nasal regurgitation. No coughing or choking spells. Notices in the region of the upper esophageal sphincter "food sitting there". No new complaints  REVIEW OF SYSTEMS:  All non-GI ROS negative upon review  Past Medical History:  Diagnosis Date  . Allergy   . Arthritis   . Asthma   . Chicken pox   . Diabetes mellitus   . Finger infection 2008   isolation  . GERD (gastroesophageal reflux disease)   . Heart murmur    benign per patient  . Hyperlipidemia   . Hypertension   . Migraine   . Osteoporosis   . Pericarditis   . Sleep apnea    Patient is scheduled for sleep study tonight  . Viral meningitis     Past Surgical History:  Procedure Laterality Date  . CARDIAC CATHETERIZATION  2012  . CESAREAN SECTION     1997, 2000  . KNEE ARTHROSCOPY Right 2010  . ORIF HUMERUS FRACTURE Right 03/25/2015   Procedure: OPEN REDUCTION INTERNAL FIXATION (ORIF) DISTAL HUMERUS FRACTURE;  Surgeon: Renette Butters, MD;  Location: [facility];  Service: Orthopedics;  Laterality: Right;    Social History ALLISA JURY  reports that she has never smoked. She has never used smokeless tobacco. She reports that she drinks alcohol. She reports that she does not use drugs.  family history includes Arthritis in her father and other; Breast cancer in her cousin; Cancer in her other; Colon polyps in her mother; Depression in her other; Diabetes  in her father and mother; Heart disease in her father and other; Heart failure in her father and mother; Hyperlipidemia in her other; Hypertension in her father and other; Peripheral vascular disease in her mother; Prostate cancer in her father.       PHYSICAL EXAMINATION: Vital signs: BP 108/72   Pulse 92   Ht 4\' 10"  (1.473 m)   Wt 179 lb 9.6 oz (81.5 kg)   BMI 37.54 kg/m   Constitutional:Obese, omitted generally well-appearing, no acute distress Psychiatric: alert and oriented x3, cooperative Eyes: extraocular movements intact, anicteric, conjunctiva pink Mouth: oral pharynx moist, no lesions Neck: supple without thyromegaly Lymph no lymphadenopathy Cardiovascular: heart regular rate and rhythm, no murmur Lungs: clear to auscultation bilaterally Abdomen: soft, obese, nontender, nondistended, no obvious ascites, no peritoneal signs, normal bowel sounds, no organomegaly Rectal: Extremities: no lower extremity edema bilaterally Skin: no lesions on visible extremities Neuro: No focal deficits. Cranial nerves intact  ASSESSMENT:  #1. Dysphagia as described. May be slightly hypertensive upper esophageal sphincter. Negative endoscopy as described. No reflux symptoms #2. 9 mm tubular adenoma January 2014. Follow-up 5 years  PLAN:  #1. Schedule barium swallow with tablet to further evaluate complaints. If no significant abnormality, expectant management must problem significantly worsens in which case she should be reevaluated #2. Surveillance colonoscopy around January 2019

## 2016-07-21 ENCOUNTER — Ambulatory Visit (HOSPITAL_COMMUNITY)
Admission: RE | Admit: 2016-07-21 | Discharge: 2016-07-21 | Disposition: A | Discharge status: Home / Self Care | Payer: BLUE CROSS/BLUE SHIELD | Source: Ambulatory Visit | Attending: Internal Medicine | Admitting: Internal Medicine

## 2016-07-21 DIAGNOSIS — R1310 Dysphagia, unspecified: Secondary | ICD-10-CM | POA: Diagnosis not present

## 2016-07-30 ENCOUNTER — Ambulatory Visit: Payer: BLUE CROSS/BLUE SHIELD | Admitting: Endocrinology

## 2016-08-06 ENCOUNTER — Ambulatory Visit (INDEPENDENT_AMBULATORY_CARE_PROVIDER_SITE_OTHER): Payer: BLUE CROSS/BLUE SHIELD | Admitting: Family Medicine

## 2016-08-06 VITALS — BP 120/66 | HR 95 | Ht <= 58 in | Wt 181.0 lb

## 2016-08-06 DIAGNOSIS — F418 Other specified anxiety disorders: Secondary | ICD-10-CM | POA: Diagnosis not present

## 2016-08-06 NOTE — Progress Notes (Signed)
Subjective:     Patient ID: Renee Lam, female   DOB: [birth year], 56 y.o.   MRN: [id number]  HPI Patient seen with multiple nonspecific complaints. She is currently in nursing school and states she has some difficulty concentrating over the past month. She's had some increased appetite and increased irritability. She has some initial complaint of chronic insomnia but states that at baseline she usually only gets about 4 hours sleep and has done this for years. She was started on CPAP recently. She has follow-up next month with pulmonary. She has history of type 2 diabetes on extremely high dose of Lantus 190 units daily. No hypoglycemia. Denies history of ADD. She denies feeling depressed. No agitation or mania type symptoms.  Past Medical History:  Diagnosis Date  . Allergy   . Arthritis   . Asthma   . Chicken pox   . Diabetes mellitus   . Finger infection 2008   isolation  . GERD (gastroesophageal reflux disease)   . Heart murmur    benign per patient  . Hyperlipidemia   . Hypertension   . Migraine   . Osteoporosis   . Pericarditis   . Sleep apnea    Patient is scheduled for sleep study tonight  . Viral meningitis    Past Surgical History:  Procedure Laterality Date  . CARDIAC CATHETERIZATION  2012  . CESAREAN SECTION     1997, 2000  . KNEE ARTHROSCOPY Right 2010  . ORIF HUMERUS FRACTURE Right 03/25/2015   Procedure: OPEN REDUCTION INTERNAL FIXATION (ORIF) DISTAL HUMERUS FRACTURE;  Surgeon: Renette Butters, MD;  Location: [facility];  Service: Orthopedics;  Laterality: Right;    reports that she has never smoked. She has never used smokeless tobacco. She reports that she drinks alcohol. She reports that she does not use drugs. family history includes Arthritis in her father and other; Breast cancer in her cousin; Cancer in her other; Colon polyps in her mother; Depression in her other; Diabetes in her father and mother; Heart disease in her father and  other; Heart failure in her father and mother; Hyperlipidemia in her other; Hypertension in her father and other; Peripheral vascular disease in her mother; Prostate cancer in her father.    Review of Systems  Constitutional: Positive for appetite change.  Respiratory: Negative for shortness of breath.   Cardiovascular: Negative for chest pain.  Psychiatric/Behavioral: Positive for decreased concentration. Negative for agitation and dysphoric mood.       Objective:   Physical Exam  Constitutional: She is oriented to person, place, and time. She appears well-developed and well-nourished.  Neck: Neck supple.  Cardiovascular: Normal rate and regular rhythm.   Pulmonary/Chest: Effort normal and breath sounds normal. No respiratory distress. She has no wheezes. She has no rales.  Musculoskeletal: She exhibits no edema.  Neurological: She is alert and oriented to person, place, and time. No cranial nerve deficit. Coordination normal.  Psychiatric: She has a normal mood and affect. Her behavior is normal. Judgment and thought content normal.       Assessment:     Patient seen with multiple nonspecific issues including recent decreased concentration, increased appetite, irritability. She states this is mostly anxiety related and largely related to situational anxiety from test taking    Plan:     -We recommend referral to one of our behavioral health specialists with brochure given. She'll check with insurance coverage. -Continue close follow-up with pulmonary regarding her sleep apnea  Renee Lam  Renee Mcwhirter MD [facility]

## 2016-08-06 NOTE — Progress Notes (Signed)
Pre visit review using our clinic review tool, if applicable. No additional management support is needed unless otherwise documented below in the visit note. 

## 2016-08-09 ENCOUNTER — Ambulatory Visit (INDEPENDENT_AMBULATORY_CARE_PROVIDER_SITE_OTHER): Payer: BLUE CROSS/BLUE SHIELD

## 2016-08-09 ENCOUNTER — Encounter (HOSPITAL_COMMUNITY): Payer: Self-pay | Admitting: Emergency Medicine

## 2016-08-09 ENCOUNTER — Ambulatory Visit (HOSPITAL_COMMUNITY)
Admission: EM | Admit: 2016-08-09 | Discharge: 2016-08-09 | Disposition: A | Discharge status: Home / Self Care | Payer: BLUE CROSS/BLUE SHIELD | Attending: Family Medicine | Admitting: Family Medicine

## 2016-08-09 DIAGNOSIS — J181 Lobar pneumonia, unspecified organism: Secondary | ICD-10-CM

## 2016-08-09 DIAGNOSIS — R05 Cough: Secondary | ICD-10-CM | POA: Diagnosis not present

## 2016-08-09 DIAGNOSIS — J189 Pneumonia, unspecified organism: Secondary | ICD-10-CM

## 2016-08-09 MED ORDER — MOXIFLOXACIN HCL 400 MG PO TABS: 400.0000 mg | ORAL_TABLET | Freq: Every day | ORAL | 0 refills | Status: DC

## 2016-08-09 NOTE — ED Provider Notes (Signed)
[place]    CSN: [id number] Arrival date & time: 08/09/16  1711     History   Chief Complaint Chief Complaint  Patient presents with  . Cough  . Generalized Body Aches    HPI Renee Lam is a 56 y.o. female.   The history is provided by the patient.  Cough  Cough characteristics:  Non-productive and harsh Severity:  Mild Onset quality:  Gradual Duration:  2 days Progression:  Unchanged Chronicity:  New Smoker: no   Context: sick contacts, upper respiratory infection and weather changes   Relieved by:  None tried Worsened by:  Nothing Ineffective treatments:  None tried Associated symptoms: chills, myalgias and rhinorrhea   Associated symptoms: no fever and no shortness of breath     Past Medical History:  Diagnosis Date  . Allergy   . Arthritis   . Asthma   . Chicken pox   . Diabetes mellitus   . Finger infection 2008   isolation  . GERD (gastroesophageal reflux disease)   . Heart murmur    benign per patient  . Hyperlipidemia   . Hypertension   . Migraine   . Osteoporosis   . Pericarditis   . Sleep apnea    Patient is scheduled for sleep study tonight  . Viral meningitis     Patient Active Problem List   Diagnosis Date Noted  . Chest pain, rule out acute myocardial infarction 04/08/2016  . Morbid obesity ([place]) 03/04/2016  . Diabetes mellitus type 2, controlled ([place]) 10/01/2015  . Diabetic retinopathy ([place]) 07/24/2015  . Fracture, humerus 03/25/2015  . Acute bronchitis with asthma 05/31/2013  . Asthma 06/18/2011  . Hypercholesterolemia 06/18/2011    Past Surgical History:  Procedure Laterality Date  . CARDIAC CATHETERIZATION  2012  . CESAREAN SECTION     1997, 2000  . KNEE ARTHROSCOPY Right 2010  . ORIF HUMERUS FRACTURE Right 03/25/2015   Procedure: OPEN REDUCTION INTERNAL FIXATION (ORIF) DISTAL HUMERUS FRACTURE;  Surgeon: Renette Butters, MD;  Location: [facility];  Service: Orthopedics;   Laterality: Right;    OB History    No data available       Home Medications    Prior to Admission medications   Medication Sig Start Date End Date Taking? Authorizing Provider  albuterol (PROVENTIL HFA;VENTOLIN HFA) 108 (90 Base) MCG/ACT inhaler Inhale 2 puffs into the lungs every 4 (four) hours as needed. For shortness of breath/wheezing 06/01/16 09/08/17 Yes Renee A Kuneff, DO  atorvastatin (LIPITOR) 80 MG tablet Take 1 tablet (80 mg total) by mouth at bedtime. 11/25/15 02/10/17 Yes Marletta Lor, MD  Insulin Glargine (LANTUS SOLOSTAR) 100 UNIT/ML Solostar Pen Inject 190 Units into the skin every morning. And pen needles 3/day 05/21/16  Yes Renato Shin, MD  BAYER CONTOUR NEXT TEST test strip USE TO TEST FOUR TIMES A DAY AS INSTRUCTED. 04/01/16   Historical Provider, MD  BD PEN NEEDLE NANO U/F 32G X 4 MM MISC USE 4 TIMES A DAY AS DIRECTED 04/30/16   Historical Provider, MD  diphenhydrAMINE (BENADRYL) 25 MG tablet Take 25 mg by mouth every 6 (six) hours as needed for allergies.     Historical Provider, MD  EPINEPHrine 0.3 mg/0.3 mL IJ SOAJ injection Inject 0.3 mLs (0.3 mg total) into the muscle once. 11/25/15   Marletta Lor, MD  Fluticasone-Salmeterol (ADVAIR DISKUS) 250-50 MCG/DOSE AEPB Inhale 1 puff into the lungs 2 (two) times daily. 06/01/16   Ma Hillock,  DO  montelukast (SINGULAIR) 10 MG tablet Take 1 tablet (10 mg total) by mouth at bedtime. 06/01/16   Renee A Kuneff, DO  moxifloxacin (AVELOX) 400 MG tablet Take 1 tablet (400 mg total) by mouth daily. One tab daily 08/09/16   Billy Fischer, MD    Family History Family History  Problem Relation Age of Onset  . Colon polyps Mother   . Diabetes Mother   . Heart failure Mother   . Peripheral vascular disease Mother   . Diabetes Father   . Heart disease Father   . Arthritis Father   . Hypertension Father   . Heart failure Father   . Prostate cancer Father   . Arthritis Other   . Cancer Other      ovary/uterus,breast,prostate  . Hyperlipidemia Other   . Heart disease Other   . Hypertension Other   . Depression Other   . Breast cancer Cousin   . Colon cancer Neg Hx   . Stomach cancer Neg Hx   . Rectal cancer Neg Hx   . Pancreatic cancer Neg Hx     Social History Social History  Substance Use Topics  . Smoking status: Never Smoker  . Smokeless tobacco: Never Used  . Alcohol use Yes     Comment: once a year     Allergies   Food; Penicillins; and Zithromax [azithromycin]   Review of Systems Review of Systems  Constitutional: Positive for chills. Negative for fever.  HENT: Positive for congestion, postnasal drip and rhinorrhea.   Respiratory: Positive for cough. Negative for shortness of breath.   Cardiovascular: Negative.   Gastrointestinal: Negative.   Genitourinary: Negative.   Musculoskeletal: Positive for myalgias.  All other systems reviewed and are negative.    Physical Exam Triage Vital Signs ED Triage Vitals [08/09/16 1756]  Enc Vitals Group     BP 125/74     Pulse Rate 92     Resp      Temp 99.1 F (37.3 C)     Temp Source Oral     SpO2 99 %     Weight      Height      Head Circumference      Peak Flow      Pain Score 0     Pain Loc      Pain Edu?      Excl. in [place]?    No data found.   Updated Vital Signs BP 125/74 (BP Location: Right Arm)   Pulse 92   Temp 99.1 F (37.3 C) (Oral)   SpO2 99%   Visual Acuity Right Eye Distance:   Left Eye Distance:   Bilateral Distance:    Right Eye Near:   Left Eye Near:    Bilateral Near:     Physical Exam  Constitutional: She is oriented to person, place, and time. She appears well-developed and well-nourished.  HENT:  Right Ear: External ear normal.  Left Ear: External ear normal.  Nose: Nose normal.  Mouth/Throat: Oropharynx is clear and moist.  Eyes: Pupils are equal, round, and reactive to light.  Neck: Normal range of motion.  Cardiovascular: Normal rate and regular rhythm.     Pulmonary/Chest: Effort normal and breath sounds normal.  Abdominal: Soft. Bowel sounds are normal.  Lymphadenopathy:    She has no cervical adenopathy.  Neurological: She is alert and oriented to person, place, and time.  Skin: Skin is warm and dry.  Nursing note and vitals reviewed.  UC Treatments / Results  Labs (all labs ordered are listed, but only abnormal results are displayed) Labs Reviewed - No data to display  EKG  EKG Interpretation None       Radiology Dg Chest 2 View  Result Date: 08/09/2016 CLINICAL DATA:  Coughing since yesterday. EXAM: CHEST  2 VIEW COMPARISON:  04/07/2016 FINDINGS: Two views study shows right infrahilar airspace disease. Chronic atelectasis or scarring noted left base. Cardiopericardial silhouette is at upper limits of normal for size. The visualized bony structures of the thorax are intact. IMPRESSION: Right infrahilar patchy airspace opacity suggests pneumonia. Follow-up imaging recommended to ensure resolution. Electronically Signed   By: Misty Stanley M.D.   On: 08/09/2016 19:11   X-rays reviewed and report per radiologist.  Procedures Procedures (including critical care time)  Medications Ordered in UC Medications - No data to display   Initial Impression / Assessment and Plan / UC Course  I have reviewed the triage vital signs and the nursing notes.  Pertinent labs & imaging results that were available during my care of the patient were reviewed by me and considered in my medical decision making (see chart for details).       Final Clinical Impressions(s) / UC Diagnoses   Final diagnoses:  Community acquired pneumonia of right middle lobe of lung ([place])    New Prescriptions New Prescriptions   MOXIFLOXACIN (AVELOX) 400 MG TABLET    Take 1 tablet (400 mg total) by mouth daily. One tab daily     Billy Fischer, MD 08/09/16 2014

## 2016-08-09 NOTE — ED Triage Notes (Signed)
Pt has been suffering from body aches and a productive cough since yesterday.  She is not sure if she has had a fever.

## 2016-08-10 ENCOUNTER — Telehealth: Payer: Self-pay

## 2016-08-10 NOTE — Telephone Encounter (Signed)
Spoke with pt and she states that she was seen at [facility] yesterday and diagnosed with CAP. She was given abx but no cough medication. Advised pt that she can try Delsym for suppressant and/or Robitussin for expectorant. Pt will try these and call office if not improving. Nothing further needed at this time.

## 2016-08-18 ENCOUNTER — Ambulatory Visit (INDEPENDENT_AMBULATORY_CARE_PROVIDER_SITE_OTHER): Payer: BLUE CROSS/BLUE SHIELD | Admitting: Ophthalmology

## 2016-08-25 ENCOUNTER — Ambulatory Visit: Payer: BLUE CROSS/BLUE SHIELD | Admitting: Endocrinology

## 2016-08-25 DIAGNOSIS — M25511 Pain in right shoulder: Secondary | ICD-10-CM | POA: Diagnosis not present

## 2016-08-30 ENCOUNTER — Encounter: Payer: Self-pay | Admitting: *Deleted

## 2016-09-06 ENCOUNTER — Telehealth: Payer: Self-pay | Admitting: Cardiology

## 2016-09-06 ENCOUNTER — Ambulatory Visit: Payer: PRIVATE HEALTH INSURANCE | Admitting: Cardiology

## 2016-09-06 NOTE — Telephone Encounter (Signed)
ok 

## 2016-09-06 NOTE — Telephone Encounter (Signed)
New Message  Pt was called for appt reminder. Pt states she needed to reschedule appt. Pt states she has not been able to use her cpap machine correctly do to a cough. Pt would like to know what she will need to bring to appt when she comes in 4/4. Please call back to discuss

## 2016-09-06 NOTE — Telephone Encounter (Signed)
Called the patient back and she stated she is recovering from pneumonia and she has not wore her device in 3 weeks. She has a lingering cough that is triggered by the air coming through her cpap unit when she wears the mask. She was compliant on her device prior to getting sick and it helped her to sleep. She says she will go by [facility] and get them to pull a download to fax to our office on Tuesday 09/07/16. She plans on keeping her 09/15/16 appt.

## 2016-09-08 ENCOUNTER — Ambulatory Visit: Payer: BLUE CROSS/BLUE SHIELD | Admitting: Cardiology

## 2016-09-09 NOTE — Telephone Encounter (Signed)
New Message    Per pt went to [facility] and they stated that her machine did not have a chip in machine. Requesting call back from nurse

## 2016-09-10 NOTE — Telephone Encounter (Signed)
Called [facility] and talked to [place] about the patients machine and how we can get a download for her upcoming appointment on Monday 09/15/16. Ailene Ravel is going to call the patient and offer her a loaner machine to get a download if a chip did not come in the box. I will follow up with the patient next week after [facility] has a chance to speak to her first

## 2016-09-14 ENCOUNTER — Encounter: Payer: Self-pay | Admitting: Internal Medicine

## 2016-09-14 ENCOUNTER — Ambulatory Visit (INDEPENDENT_AMBULATORY_CARE_PROVIDER_SITE_OTHER): Payer: BLUE CROSS/BLUE SHIELD | Admitting: Internal Medicine

## 2016-09-14 VITALS — BP 118/62 | HR 84 | Temp 98.6°F | Ht <= 58 in | Wt 185.2 lb

## 2016-09-14 DIAGNOSIS — E7800 Pure hypercholesterolemia, unspecified: Secondary | ICD-10-CM | POA: Diagnosis not present

## 2016-09-14 DIAGNOSIS — H60391 Other infective otitis externa, right ear: Secondary | ICD-10-CM | POA: Diagnosis not present

## 2016-09-14 DIAGNOSIS — J4520 Mild intermittent asthma, uncomplicated: Secondary | ICD-10-CM | POA: Diagnosis not present

## 2016-09-14 MED ORDER — NEOMYCIN-POLYMYXIN-HC 3.5-10000-1 OT SOLN: 3.0000 [drp] | Freq: Four times a day (QID) | OTIC | 0 refills | Status: DC

## 2016-09-14 NOTE — Patient Instructions (Addendum)
Otitis Externa Otitis externa is an infection of the outer ear canal. The outer ear canal is the area between the outside of the ear and the eardrum. Otitis externa is sometimes called "swimmer's ear." Follow these instructions at home:  If you were given antibiotic ear drops, use them as told by your doctor. Do not stop using them even if your condition gets better.  Take over-the-counter and prescription medicines only as told by your doctor.  Keep all follow-up visits as told by your doctor. This is important. How is this prevented?  Keep your ear dry. Use the corner of a towel to dry your ear after you swim or bathe.  Try not to scratch or put things in your ear. Doing these things makes it easier for germs to grow in your ear.  Avoid swimming in lakes, dirty water, or pools that may not have the right amount of a chemical called chlorine.  Consider making ear drops and putting 3 or 4 drops in each ear after you swim. Ask your doctor about how you can make ear drops. Contact a doctor if:  You have a fever.  After 3 days your ear is still red, swollen, or painful.  After 3 days you still have pus coming from your ear.  Your redness, swelling, or pain gets worse.  You have a really bad headache.  You have redness, swelling, pain, or tenderness behind your ear. This information is not intended to replace advice given to you by your health care provider. Make sure you discuss any questions you have with your health care provider. Document Released: 11/17/2007 Document Revised: 06/26/2015 Document Reviewed: 03/10/2015 Elsevier Interactive Patient Education  2017 Elsevier Inc.  

## 2016-09-14 NOTE — Progress Notes (Signed)
Pre visit review using our clinic review tool, if applicable. No additional management support is needed unless otherwise documented below in the visit note. 

## 2016-09-14 NOTE — Progress Notes (Signed)
Subjective:    Patient ID: Renee Lam, female    DOB: 1960/09/19, 56 y.o.   MRN: [id number]  HPI  56 year old patient who presents today with a chief complaint of right ear discomfort over the past 2 days.  She has noticed some discomfort about the right ear.  She has noticed some drainage from the right ear also occasionally through the night.  She also describes some possible dental pain with chewing. She does have a history of diabetes and is scheduled for endocrine follow-up next week.  Her last hemoglobin A1c was greater than 10.  Patient states  that she rechecked a  hemoglobin A1c persist after drugstore with a reading of 7.2.  She feels her glycemic control has improved She has dyslipidemia and remains on high intensity atorvastatin.       Past Medical History:  Diagnosis Date  . Allergy   . Arthritis   . Asthma   . Chicken pox   . Diabetes mellitus   . Finger infection 2008   isolation  . GERD (gastroesophageal reflux disease)   . Heart murmur    benign per patient  . Hyperlipidemia   . Hypertension   . Migraine   . Osteoporosis   . Pericarditis   . Sleep apnea    Patient is scheduled for sleep study tonight  . Viral meningitis            BP 118/62 (BP Location: Left Arm, Patient Position: Sitting, Cuff Size: Normal)   Pulse 84   Temp 98.6 F (37 C) (Oral)   Ht 4\' 10"  (1.473 m)   Wt 185 lb 3.2 oz (84 kg)   SpO2 98%   BMI 38.71 kg/m    Review of Systems  Constitutional: Negative.   HENT: Positive for ear discharge and ear pain. Negative for congestion, dental problem, hearing loss, rhinorrhea, sinus pressure, sore throat and tinnitus.   Eyes: Negative for pain, discharge and visual disturbance.  Respiratory: Negative for cough and shortness of breath.   Cardiovascular: Negative for chest pain, palpitations and leg swelling.  Gastrointestinal: Negative for abdominal distention, abdominal pain, blood in stool, constipation, diarrhea,  nausea and vomiting.  Genitourinary: Negative for difficulty urinating, dysuria, flank pain, frequency, hematuria, pelvic pain, urgency, vaginal bleeding, vaginal discharge and vaginal pain.  Musculoskeletal: Negative for arthralgias, gait problem and joint swelling.  Skin: Negative for rash.  Neurological: Negative for dizziness, syncope, speech difficulty, weakness, numbness and headaches.  Hematological: Negative for adenopathy.  Psychiatric/Behavioral: Negative for agitation, behavioral problems and dysphoric mood. The patient is not nervous/anxious.        Objective:   Physical Exam  Constitutional: She is oriented to person, place, and time. She appears well-developed and well-nourished. No distress.    No distress Afebrile  HENT:  Head: Normocephalic.  Right Ear: External ear normal.  Left Ear: External ear normal.  Mouth/Throat: Oropharynx is clear and moist.  The right tympanic membrane with some chronic changes, but did not appear to be acutely inflamed.  Minimal erythema of the canal but no exudate Tenderness was noted in the preauricular area Oral pharynx unremarkable  Eyes: Conjunctivae and EOM are normal. Pupils are equal, round, and reactive to light.  Neck: Normal range of motion. Neck supple. No thyromegaly present.  Cardiovascular: Normal rate, regular rhythm, normal heart sounds and intact distal pulses.   Pulmonary/Chest: Effort normal and breath sounds normal.  Abdominal: Soft. Bowel sounds are normal. She exhibits no mass. There is  no tenderness.  Musculoskeletal: Normal range of motion.  Lymphadenopathy:    She has no cervical adenopathy.  Neurological: She is alert and oriented to person, place, and time.  Skin: Skin is warm and dry. No rash noted.  Psychiatric: She has a normal mood and affect. Her behavior is normal.          Assessment & Plan:   Right ear and.  Radicular discomfort.  Possible otitis externa but exam is not convincing.  Will treat  with otic drops and observe.  Patient is to be seen by endocrine next week Dyslipidemia.  Continue high intensity statin therapy  [name]

## 2016-09-15 ENCOUNTER — Telehealth: Payer: Self-pay | Admitting: *Deleted

## 2016-09-15 ENCOUNTER — Ambulatory Visit: Payer: BLUE CROSS/BLUE SHIELD | Admitting: Cardiology

## 2016-09-15 DIAGNOSIS — G4733 Obstructive sleep apnea (adult) (pediatric): Secondary | ICD-10-CM

## 2016-09-15 NOTE — Telephone Encounter (Signed)
-----   Message from Sueanne Margarita, MD sent at 09/14/2016  7:43 PM EDT ----- Please get a 2 week autotitration from 4 to 18cm H2O as her AHI is too high.

## 2016-09-22 ENCOUNTER — Ambulatory Visit (INDEPENDENT_AMBULATORY_CARE_PROVIDER_SITE_OTHER): Payer: BLUE CROSS/BLUE SHIELD | Admitting: Ophthalmology

## 2016-09-22 DIAGNOSIS — H2513 Age-related nuclear cataract, bilateral: Secondary | ICD-10-CM | POA: Diagnosis not present

## 2016-09-22 DIAGNOSIS — H353131 Nonexudative age-related macular degeneration, bilateral, early dry stage: Secondary | ICD-10-CM

## 2016-09-22 DIAGNOSIS — H43813 Vitreous degeneration, bilateral: Secondary | ICD-10-CM | POA: Diagnosis not present

## 2016-09-22 LAB — HM DIABETES EYE EXAM

## 2016-09-23 ENCOUNTER — Telehealth: Payer: Self-pay | Admitting: *Deleted

## 2016-09-23 NOTE — Telephone Encounter (Signed)
-----   Message from Sueanne Margarita, MD sent at 09/14/2016  7:43 PM EDT ----- Please get a 2 week autotitration from 4 to 18cm H2O as her AHI is too high.

## 2016-09-24 ENCOUNTER — Encounter: Payer: Self-pay | Admitting: Family Medicine

## 2016-09-29 ENCOUNTER — Other Ambulatory Visit: Payer: Self-pay | Admitting: Obstetrics and Gynecology

## 2016-09-29 DIAGNOSIS — Z124 Encounter for screening for malignant neoplasm of cervix: Secondary | ICD-10-CM | POA: Diagnosis not present

## 2016-09-29 DIAGNOSIS — Z1231 Encounter for screening mammogram for malignant neoplasm of breast: Secondary | ICD-10-CM | POA: Diagnosis not present

## 2016-09-29 DIAGNOSIS — Z01419 Encounter for gynecological examination (general) (routine) without abnormal findings: Secondary | ICD-10-CM | POA: Diagnosis not present

## 2016-09-29 DIAGNOSIS — Z6838 Body mass index (BMI) 38.0-38.9, adult: Secondary | ICD-10-CM | POA: Diagnosis not present

## 2016-09-30 LAB — CYTOLOGY - PAP

## 2016-10-01 ENCOUNTER — Encounter: Payer: Self-pay | Admitting: Endocrinology

## 2016-10-01 ENCOUNTER — Ambulatory Visit (INDEPENDENT_AMBULATORY_CARE_PROVIDER_SITE_OTHER): Payer: BLUE CROSS/BLUE SHIELD | Admitting: Endocrinology

## 2016-10-01 VITALS — BP 118/68 | HR 102 | Temp 98.1°F | Ht <= 58 in | Wt 188.5 lb

## 2016-10-01 DIAGNOSIS — E11319 Type 2 diabetes mellitus with unspecified diabetic retinopathy without macular edema: Secondary | ICD-10-CM

## 2016-10-01 DIAGNOSIS — Z794 Long term (current) use of insulin: Secondary | ICD-10-CM | POA: Diagnosis not present

## 2016-10-01 LAB — POCT GLYCOSYLATED HEMOGLOBIN (HGB A1C): Hemoglobin A1C: 8.9

## 2016-10-01 MED ORDER — DULAGLUTIDE 0.75 MG/0.5ML ~~LOC~~ SOAJ: 0.7500 mg | SUBCUTANEOUS | 11 refills | Status: DC

## 2016-10-01 NOTE — Patient Instructions (Addendum)
check your blood sugar twice a day.  vary the time of day when you check, between before the 3 meals, and at bedtime.  also check if you have symptoms of your blood sugar being too high or too low.  please keep a record of the readings and bring it to your next appointment here.  You can write it on any piece of paper.  please call us sooner if your blood sugar goes below 70, or if you have a lot of readings over 200.  Please continue the same lantus, and:  Add "trulicity," once a week.  I have sent a prescription to your pharmacy.   If you don't have nausea, please call, and I would be happy to double it.   Please come back for a follow-up appointment in 3 months.     Bariatric Surgery You have so much to gain by losing weight.  You may have already tried every diet and exercise plan imaginable.  And, you may have sought advice from your family physician, too.   Sometimes, in spite of such diligent efforts, you may not be able to achieve long-term results by yourself.  In cases of severe obesity, bariatric or weight loss surgery is a proven method of achieving long-term weight control.  Our Services Our bariatric surgery programs offer our patients new hope and long-term weight-loss solution.  Since introducing our services in 2003, we have conducted more than 2,400 successful procedures.  Our program is designated as a Programmer, multimedia by the Metabolic and Bariatric Surgery Accreditation and Quality Improvement Program (MBSAQIP), a IT trainer that sets rigorous patient safety and outcome standards.  Our program is also designated as a Ecologist by [employer].   Our exceptional weight-loss surgery team specializes in diagnosis, treatment, follow-up care, and ongoing support for our patients with severe weight loss challenges.  We currently offer laparoscopic sleeve gastrectomy, gastric bypass, and adjustable gastric band (LAP-BAND).    Attend our  [place] Choosing to undergo a bariatric procedure is a big decision, and one that should not be taken lightly.  You now have two options in how you learn about weight-loss surgery - in person or online.  Our objective is to ensure you have all of the information that you need to evaluate the advantages and obligations of this life changing procedure.  Please note that you are not alone in this process, and our experienced team is ready to assist and answer all of your questions.  There are several ways to register for a seminar (either on-line or in person): 1)  Call [phone] 2) Go on-line to [facility] and register for either type of seminar.  [email or web address]

## 2016-10-01 NOTE — Progress Notes (Signed)
Subjective:    Patient ID: Renee Lam, female    DOB: 1960-11-05, 56 y.o.   MRN: [id number]  HPI Pt returns for f/u of diabetes mellitus: DM type: Insulin-requiring type 2 Dx'ed: 9563 Complications: polyneuropathy and retinopathy. Therapy: insulin since 2010 GDM: 1996 and 1999.  DKA: never.  Severe hypoglycemia: never.     Pancreatitis: never. Other: she takes QD insulin, after poor results with multiple daily injections.   Interval history: She says she never misses the lantus.  no cbg record, but states cbg's vary from 120-166.  pt states she feels well in general.   Past Medical History:  Diagnosis Date  . Allergy   . Arthritis   . Asthma   . Chicken pox   . Diabetes mellitus   . Finger infection 2008   isolation  . GERD (gastroesophageal reflux disease)   . Heart murmur    benign per patient  . Hyperlipidemia   . Hypertension   . Migraine   . Osteoporosis   . Pericarditis   . Sleep apnea    Patient is scheduled for sleep study tonight  . Viral meningitis     Past Surgical History:  Procedure Laterality Date  . CARDIAC CATHETERIZATION  2012  . CESAREAN SECTION     1997, 2000  . KNEE ARTHROSCOPY Right 2010  . ORIF HUMERUS FRACTURE Right 03/25/2015   Procedure: OPEN REDUCTION INTERNAL FIXATION (ORIF) DISTAL HUMERUS FRACTURE;  Surgeon: Renette Butters, MD;  Location: [place];  Service: Orthopedics;  Laterality: Right;    Social History   Social History  . Marital status: Divorced    Spouse name: N/A  . Number of children: N/A  . Years of education: N/A   Occupational History  . Not on file.   Social History Main Topics  . Smoking status: Never Smoker  . Smokeless tobacco: Never Used  . Alcohol use Yes     Comment: once a year  . Drug use: No  . Sexual activity: Yes    Birth control/ protection: Post-menopausal   Other Topics Concern  . Not on file   Social History Narrative  . No narrative on file    Current  Outpatient Prescriptions on File Prior to Visit  Medication Sig Dispense Refill  . albuterol (PROVENTIL HFA;VENTOLIN HFA) 108 (90 Base) MCG/ACT inhaler Inhale 2 puffs into the lungs every 4 (four) hours as needed. For shortness of breath/wheezing 1 Inhaler 12  . atorvastatin (LIPITOR) 80 MG tablet Take 1 tablet (80 mg total) by mouth at bedtime. 90 tablet 3  . BAYER CONTOUR NEXT TEST test strip USE TO TEST FOUR TIMES A DAY AS INSTRUCTED.  12  . BD PEN NEEDLE NANO U/F 32G X 4 MM MISC USE 4 TIMES A DAY AS DIRECTED  12  . diphenhydrAMINE (BENADRYL) 25 MG tablet Take 25 mg by mouth every 6 (six) hours as needed for allergies.     Marland Kitchen EPINEPHrine 0.3 mg/0.3 mL IJ SOAJ injection Inject 0.3 mLs (0.3 mg total) into the muscle once. 1 Device 1  . Fluticasone-Salmeterol (ADVAIR DISKUS) 250-50 MCG/DOSE AEPB Inhale 1 puff into the lungs 2 (two) times daily. 60 each 11  . Insulin Glargine (LANTUS SOLOSTAR) 100 UNIT/ML Solostar Pen Inject 190 Units into the skin every morning. And pen needles 3/day 20 pen 3  . montelukast (SINGULAIR) 10 MG tablet Take 1 tablet (10 mg total) by mouth at bedtime. 30 tablet 3   Current Facility-Administered Medications  on File Prior to Visit  Medication Dose Route Frequency Provider Last Rate Last Dose  . 0.9 %  sodium chloride infusion  500 mL Intravenous Continuous Irene Shipper, MD          Family History  Problem Relation Age of Onset  . Colon polyps Mother   . Diabetes Mother   . Heart failure Mother   . Peripheral vascular disease Mother   . Diabetes Father   . Heart disease Father   . Arthritis Father   . Hypertension Father   . Heart failure Father   . Prostate cancer Father   . Arthritis Other   . Hyperlipidemia Other   . Heart disease Other   . Hypertension Other   . Depression Other   . Ovarian cancer Other   . Uterine cancer Other   . Breast cancer Other   . Prostate cancer Other   . Breast cancer Cousin   . Colon cancer Neg Hx   . Stomach cancer  Neg Hx   . Rectal cancer Neg Hx   . Pancreatic cancer Neg Hx    BP 118/68 (BP Location: Left Arm, Patient Position: Sitting, Cuff Size: Normal)   Pulse (!) 102   Temp 98.1 F (36.7 C) (Oral)   Ht 4\' 10"  (1.473 m)   Wt 188 lb 8 oz (85.5 kg)   SpO2 96%   BMI 39.40 kg/m   Review of Systems She has weight gain.      Objective:   Physical Exam VITAL SIGNS:  See vs page GENERAL: no distress Pulses: dorsalis pedis intact bilat.   MSK: no deformity of the feet CV: no leg edema Skin:  no ulcer on the feet.  normal color and temp on the feet. Neuro: sensation is intact to touch on the feet.    a1c=8.9%    Assessment & Plan:  Obesity: worse.  Pt asks that DM rx address this, as well.  Insulin-requiring type 2 DM, with retinopathy: she needs increased rx.  Patient Instructions  check your blood sugar twice a day.  vary the time of day when you check, between before the 3 meals, and at bedtime.  also check if you have symptoms of your blood sugar being too high or too low.  please keep a record of the readings and bring it to your next appointment here.  You can write it on any piece of paper.  please call us sooner if your blood sugar goes below 70, or if you have a lot of readings over 200.  Please continue the same lantus, and:  Add "trulicity," once a week.  I have sent a prescription to your pharmacy.   If you don't have nausea, please call, and I would be happy to double it.   Please come back for a follow-up appointment in 3 months.     Bariatric Surgery You have so much to gain by losing weight.  You may have already tried every diet and exercise plan imaginable.  And, you may have sought advice from your family physician, too.   Sometimes, in spite of such diligent efforts, you may not be able to achieve long-term results by yourself.  In cases of severe obesity, bariatric or weight loss surgery is a proven method of achieving long-term weight control.  Our Services Our  bariatric surgery programs offer our patients new hope and long-term weight-loss solution.  Since introducing our services in 2003, we have conducted more than 2,400 successful  procedures.  Our program is designated as a Programmer, multimedia by the Metabolic and Bariatric Surgery Accreditation and Quality Improvement Program (MBSAQIP), a IT trainer that sets rigorous patient safety and outcome standards.  Our program is also designated as a Ecologist by [employer].   Our exceptional weight-loss surgery team specializes in diagnosis, treatment, follow-up care, and ongoing support for our patients with severe weight loss challenges.  We currently offer laparoscopic sleeve gastrectomy, gastric bypass, and adjustable gastric band (LAP-BAND).    Attend our [place] Choosing to undergo a bariatric procedure is a big decision, and one that should not be taken lightly.  You now have two options in how you learn about weight-loss surgery - in person or online.  Our objective is to ensure you have all of the information that you need to evaluate the advantages and obligations of this life changing procedure.  Please note that you are not alone in this process, and our experienced team is ready to assist and answer all of your questions.  There are several ways to register for a seminar (either on-line or in person): 1)  Call [phone] 2) Go on-line to [facility] and register for either type of seminar.  [email or web address]

## 2016-10-06 ENCOUNTER — Telehealth: Payer: Self-pay | Admitting: Endocrinology

## 2016-10-06 NOTE — Telephone Encounter (Signed)
Pt called in and said that [facility] sent [facility] a [facility] Request for her Trulicity and she would like to know the status, requests call back.

## 2016-10-07 NOTE — Telephone Encounter (Signed)
I contacted the patient and advised we submitted the PA for the trulicity this morning via voicemail. Patient advised we would call back and advise of the response once received.

## 2016-10-12 NOTE — Telephone Encounter (Signed)
Patient called requesting a status update with the PA on trulicity. Please update the patient.

## 2016-10-13 ENCOUNTER — Telehealth: Payer: Self-pay | Admitting: Endocrinology

## 2016-10-13 NOTE — Telephone Encounter (Addendum)
I have not received info from the insurance company yet. Please call them to get the update.

## 2016-10-13 NOTE — Telephone Encounter (Signed)
This has been submitted to [employer], but a determination has not been received. Please call the insurance company to verify.

## 2016-10-13 NOTE — Telephone Encounter (Signed)
Need PA for medication  Dulaglutide (TRULICITY) 7.35 HG/9.9ME SOPN  [id number] [place] [place]

## 2016-10-14 ENCOUNTER — Ambulatory Visit (INDEPENDENT_AMBULATORY_CARE_PROVIDER_SITE_OTHER): Payer: BLUE CROSS/BLUE SHIELD | Admitting: Adult Health

## 2016-10-14 VITALS — BP 108/62 | Temp 98.5°F | Wt 186.2 lb

## 2016-10-14 DIAGNOSIS — H9221 Otorrhagia, right ear: Secondary | ICD-10-CM | POA: Diagnosis not present

## 2016-10-14 NOTE — Telephone Encounter (Signed)
Pt states that she called the insurance company and they state that there is a form that gives the pt permission to [facility]. Unaware of how to find such form please advise.

## 2016-10-14 NOTE — Progress Notes (Signed)
Subjective:    Patient ID: Renee Lam, female    DOB: 11-11-1960, 56 y.o.   MRN: [id number]  HPI  56 year old female who presents to the office today for evaluation after she had a single episode of bright red blood on a qtip this morning after cleaning her ears. The blood came from the right ear. She denies ear pain, fevers, sinus issus, or tinnitus.   Review of Systems See HPI   Past Medical History:  Diagnosis Date  . Allergy   . Arthritis   . Asthma   . Chicken pox   . Diabetes mellitus   . Finger infection 2008   isolation  . GERD (gastroesophageal reflux disease)   . Heart murmur    benign per patient  . Hyperlipidemia   . Hypertension   . Migraine   . Osteoporosis   . Pericarditis   . Sleep apnea    Patient is scheduled for sleep study tonight  . Viral meningitis     Social History   Social History  . Marital status: Divorced    Spouse name: N/A  . Number of children: N/A  . Years of education: N/A   Occupational History  . Not on file.   Social History Main Topics  . Smoking status: Never Smoker  . Smokeless tobacco: Never Used  . Alcohol use Yes     Comment: once a year  . Drug use: No  . Sexual activity: Yes    Birth control/ protection: Post-menopausal   Other Topics Concern  . Not on file   Social History Narrative  . No narrative on file    Past Surgical History:  Procedure Laterality Date  . CARDIAC CATHETERIZATION  2012  . CESAREAN SECTION     1997, 2000  . KNEE ARTHROSCOPY Right 2010  . ORIF HUMERUS FRACTURE Right 03/25/2015   Procedure: OPEN REDUCTION INTERNAL FIXATION (ORIF) DISTAL HUMERUS FRACTURE;  Surgeon: Renette Butters, MD;  Location: [place];  Service: Orthopedics;  Laterality: Right;    Family History  Problem Relation Age of Onset  . Colon polyps Mother   . Diabetes Mother   . Heart failure Mother   . Peripheral vascular disease Mother   . Diabetes Father   . Heart disease Father     . Arthritis Father   . Hypertension Father   . Heart failure Father   . Prostate cancer Father   . Arthritis Other   . Hyperlipidemia Other   . Heart disease Other   . Hypertension Other   . Depression Other   . Ovarian cancer Other   . Uterine cancer Other   . Breast cancer Other   . Prostate cancer Other   . Breast cancer Cousin   . Colon cancer Neg Hx   . Stomach cancer Neg Hx   . Rectal cancer Neg Hx   . Pancreatic cancer Neg Hx       Current Outpatient Prescriptions on File Prior to Visit  Medication Sig Dispense Refill  . albuterol (PROVENTIL HFA;VENTOLIN HFA) 108 (90 Base) MCG/ACT inhaler Inhale 2 puffs into the lungs every 4 (four) hours as needed. For shortness of breath/wheezing 1 Inhaler 12  . atorvastatin (LIPITOR) 80 MG tablet Take 1 tablet (80 mg total) by mouth at bedtime. 90 tablet 3  . BAYER CONTOUR NEXT TEST test strip USE TO TEST FOUR TIMES A DAY AS INSTRUCTED.  12  . BD PEN NEEDLE NANO U/F  32G X 4 MM MISC USE 4 TIMES A DAY AS DIRECTED  12  . diphenhydrAMINE (BENADRYL) 25 MG tablet Take 25 mg by mouth every 6 (six) hours as needed for allergies.     Marland Kitchen EPINEPHrine 0.3 mg/0.3 mL IJ SOAJ injection Inject 0.3 mLs (0.3 mg total) into the muscle once. 1 Device 1  . Fluticasone-Salmeterol (ADVAIR DISKUS) 250-50 MCG/DOSE AEPB Inhale 1 puff into the lungs 2 (two) times daily. 60 each 11  . Insulin Glargine (LANTUS SOLOSTAR) 100 UNIT/ML Solostar Pen Inject 190 Units into the skin every morning. And pen needles 3/day 20 pen 3  . montelukast (SINGULAIR) 10 MG tablet Take 1 tablet (10 mg total) by mouth at bedtime. 30 tablet 3  . Dulaglutide (TRULICITY) 2.33 ID/5.6YS SOPN Inject 0.75 mg into the skin once a week. (Patient not taking: Reported on 10/14/2016) 4 pen 11   Current Facility-Administered Medications on File Prior to Visit  Medication Dose Route Frequency Provider Last Rate Last Dose  . 0.9 %  sodium chloride infusion  500 mL Intravenous Continuous Irene Shipper, MD         BP 108/62 (BP Location: Right Arm, Patient Position: Sitting, Cuff Size: Large)   Temp 98.5 F (36.9 C) (Oral)   Wt 186 lb 3.2 oz (84.5 kg)   BMI 38.92 kg/m       Objective:   Physical Exam  Constitutional: She is oriented to person, place, and time. She appears well-developed and well-nourished. No distress.  HENT:  Head: Normocephalic and atraumatic.  Right Ear: External ear normal.  Left Ear: External ear normal.  Nose: Nose normal.  Mouth/Throat: Oropharynx is clear and moist.  Small area of irritation on the lower right aspect of right inner ear. No active bleeding. No polyps or masses seen   Neurological: She is alert and oriented to person, place, and time.  Skin: Skin is warm and dry. No rash noted. She is not diaphoretic. No erythema. No pallor.  Psychiatric: She has a normal mood and affect. Her behavior is normal. Judgment and thought content normal.  Nursing note and vitals reviewed.     Assessment & Plan:  1. Blood in right ear canal - Probably from cleaning ears daily. Advised to stop cleaning ears as often and to stop using q tips.  - follow up with continued symptoms   Dorothyann Peng, NP

## 2016-10-14 NOTE — Telephone Encounter (Signed)
Best I can guess is that PA is needed. I need pa form.

## 2016-10-14 NOTE — Patient Instructions (Signed)
WE NOW OFFER   [facility] FAST TRACK!!!  SAME DAY Appointments for ACUTE CARE  Such as: Sprains, Injuries, cuts, abrasions, rashes, muscle pain, joint pain, back pain Colds, flu, sore throats, headache, allergies, cough, fever  Ear pain, sinus and eye infections Abdominal pain, nausea, vomiting, diarrhea, upset stomach Animal/insect bites  3 Easy Ways to Schedule: Walk-In Scheduling Call in scheduling Mychart Sign-up: [email or web address]         

## 2016-10-15 NOTE — Telephone Encounter (Signed)
PA placed on your desk to review.  

## 2016-10-15 NOTE — Telephone Encounter (Signed)
done

## 2016-10-16 ENCOUNTER — Other Ambulatory Visit: Payer: Self-pay | Admitting: Endocrinology

## 2016-10-18 NOTE — Telephone Encounter (Signed)
Pt states the novolog was not received by the pharmacy can we please resend it

## 2016-10-19 ENCOUNTER — Telehealth: Payer: Self-pay | Admitting: Endocrinology

## 2016-10-19 ENCOUNTER — Other Ambulatory Visit: Payer: Self-pay | Admitting: Endocrinology

## 2016-10-19 MED ORDER — LIRAGLUTIDE 18 MG/3ML ~~LOC~~ SOPN: 1.8000 mg | PEN_INJECTOR | Freq: Every day | SUBCUTANEOUS | 11 refills | Status: DC

## 2016-10-19 NOTE — Telephone Encounter (Signed)
please call patient: Ins declined trulicity: Instead, you should take "victoza" pen, once a day.  The side-effect is nausea, which goes away with time.  To avoid this side-effect, start with the lowest (0.6) setting.  After a few days, increase to 1.2.  If you still have little or no nausea, increase to the highest (1.8) setting, and continue that setting.

## 2016-10-20 ENCOUNTER — Encounter: Payer: Self-pay | Admitting: Cardiology

## 2016-10-20 NOTE — Telephone Encounter (Signed)
[facility] [facility] [phone]  Irie called to check on the status of the prior authorization on Trulicity, she stated that she called her insurance and they said they had not seen prior authorization. Please call her after 3:30 and let her know if we have received it or if another medication has been sent to her pharmacy.

## 2016-10-20 NOTE — Telephone Encounter (Signed)
This has been resolved- patient has been prescribed Victoza and notified

## 2016-10-20 NOTE — Telephone Encounter (Signed)
This was already addressed in a previous note and patient has been notified prescription was also ordered

## 2016-10-20 NOTE — Telephone Encounter (Signed)
Pt called in to speak with the nurse, please call patient back as soon as possible, she has called several times.

## 2016-10-21 NOTE — Telephone Encounter (Signed)
[facility]

## 2016-10-21 NOTE — Telephone Encounter (Signed)
Please see previous notes- patient was switched to Victoza so no PA needed and patient was notified

## 2016-10-21 NOTE — Telephone Encounter (Signed)
Patient is calling to get the correct dosage of medication,  liraglutide (VICTOZA) 18 MG/3ML SOPN  Please advise

## 2016-10-25 ENCOUNTER — Ambulatory Visit (INDEPENDENT_AMBULATORY_CARE_PROVIDER_SITE_OTHER): Payer: BLUE CROSS/BLUE SHIELD | Admitting: Cardiology

## 2016-10-25 ENCOUNTER — Encounter: Payer: Self-pay | Admitting: Cardiology

## 2016-10-25 ENCOUNTER — Other Ambulatory Visit (INDEPENDENT_AMBULATORY_CARE_PROVIDER_SITE_OTHER): Payer: BLUE CROSS/BLUE SHIELD

## 2016-10-25 DIAGNOSIS — Z23 Encounter for immunization: Secondary | ICD-10-CM | POA: Diagnosis not present

## 2016-10-25 DIAGNOSIS — G4733 Obstructive sleep apnea (adult) (pediatric): Secondary | ICD-10-CM

## 2016-10-25 DIAGNOSIS — E669 Obesity, unspecified: Secondary | ICD-10-CM | POA: Diagnosis not present

## 2016-10-25 HISTORY — DX: Obstructive sleep apnea (adult) (pediatric): G47.33

## 2016-10-25 NOTE — Patient Instructions (Signed)
Medication Instructions:  Your physician recommends that you continue on your current medications as directed. Please refer to the Current Medication list given to you today.   Labwork: None  Testing/Procedures: None  Follow-Up: Your physician wants you to follow-up in: 1 year with Dr. Radford Pax. You will receive a reminder letter in the mail two months in advance. If you don't receive a letter, please call our office to schedule the follow-up appointment.   Any Other Special Instructions Will Be Listed Below (If Applicable). Dr. Radford Pax has ordered for you a 2 week auto-titration. Please call our CPAP assistant, Gae Bon, at [phone] if you do not hear from your medical equipment company in the next few days to arrange this.    If you need a refill on your cardiac medications before your next appointment, please call your pharmacy.

## 2016-10-25 NOTE — Progress Notes (Addendum)
Cardiology Office Note    Date:  10/26/2016   ID:  Renee Lam, DOB [birth year], MRN [id number]  PCP:  Renee Lor, MD  Cardiologist:  Renee Him, MD   Chief Complaint  Patient presents with  . Sleep Apnea    History of Present Illness:  Renee Lam is a 56 y.o. female with a history of HTN and DM who presents today for evaluation of OSA.  She was referred by Dr. Tamala Lam due to history of severe snoring and choking sensations during sleep which would wake her up and was found to have severe OSA with an AHI of 46.8/hr with oxygen desaturations as low as 72% with loud snoring and underwent CPAP titration to 11cm H2O.  She is now here for followup. She tolerates her CPAP device and feels the pressure is adequately.  She is doing well with her nasal mask without any problems.  She feels rested in the am but does get tired during the day but she is in school and stays up late at night.  She does not think that she is snoring an no longer awakens with the choking sensation.  She denies any significant mouth or nasal dryness.  She rarely drinks ETOH.     Past Medical History:  Diagnosis Date  . Allergy   . Arthritis   . Asthma   . Chicken pox   . Diabetes mellitus   . Finger infection 2008   isolation  . GERD (gastroesophageal reflux disease)   . Heart murmur    benign per patient  . Hyperlipidemia   . Hypertension   . Migraine   . OSA (obstructive sleep apnea) 10/25/2016  . Osteoporosis   . Pericarditis   . Sleep apnea    Patient is scheduled for sleep study tonight  . Viral meningitis     Past Surgical History:  Procedure Laterality Date  . CARDIAC CATHETERIZATION  2012  . CESAREAN SECTION     1997, 2000  . KNEE ARTHROSCOPY Right 2010  . ORIF HUMERUS FRACTURE Right 03/25/2015   Procedure: OPEN REDUCTION INTERNAL FIXATION (ORIF) DISTAL HUMERUS FRACTURE;  Surgeon: Renette Butters, MD;  Location: [facility];  Service: Orthopedics;   Laterality: Right;    Current Medications: Current Meds  Medication Sig  . albuterol (PROVENTIL HFA;VENTOLIN HFA) 108 (90 Base) MCG/ACT inhaler Inhale 2 puffs into the lungs every 4 (four) hours as needed. For shortness of breath/wheezing  . atorvastatin (LIPITOR) 80 MG tablet Take 1 tablet (80 mg total) by mouth at bedtime.  Marland Kitchen BAYER CONTOUR NEXT TEST test strip USE TO TEST FOUR TIMES A DAY AS INSTRUCTED.  Marland Kitchen BD PEN NEEDLE NANO U/F 32G X 4 MM MISC USE AS DIRECTED 3 TIMES A DAY  . diphenhydrAMINE (BENADRYL) 25 MG tablet Take 25 mg by mouth every 6 (six) hours as needed for allergies.   Marland Kitchen EPINEPHrine 0.3 mg/0.3 mL IJ SOAJ injection Inject 0.3 mLs (0.3 mg total) into the muscle once.  . Fluticasone-Salmeterol (ADVAIR DISKUS) 250-50 MCG/DOSE AEPB Inhale 1 puff into the lungs 2 (two) times daily.  Marland Kitchen LANTUS SOLOSTAR 100 UNIT/ML Solostar Pen INJECT 190 UNITS SUBCUTANEOUSLY EVERY MORNING  . liraglutide (VICTOZA) 18 MG/3ML SOPN Inject 0.3 mLs (1.8 mg total) into the skin daily.  . montelukast (SINGULAIR) 10 MG tablet Take 1 tablet (10 mg total) by mouth at bedtime.   Current Facility-Administered Medications for the 10/25/16 encounter (Office Visit) with Sueanne Margarita, MD  Medication  . 0.9 %  sodium chloride infusion    Allergies:   Food; Penicillins; and Zithromax [azithromycin]   Social History   Social History  . Marital status: Divorced    Spouse name: N/A  . Number of children: N/A  . Years of education: N/A   Social History Main Topics  . Smoking status: Never Smoker  . Smokeless tobacco: Never Used  . Alcohol use Yes     Comment: once a year  . Drug use: No  . Sexual activity: Yes    Birth control/ protection: Post-menopausal   Other Topics Concern  . None   Social History Narrative  . None     Family History:  The patient's family history includes Arthritis in her father and other; Breast cancer in her cousin and other; Colon polyps in her mother; Depression in her  other; Diabetes in her father and mother; Heart disease in her father and other; Heart failure in her father and mother; Hyperlipidemia in her other; Hypertension in her father and other; Ovarian cancer in her other; Peripheral vascular disease in her mother; Prostate cancer in her father and other; Uterine cancer in her other.   ROS:   Please see the history of present illness.    ROS All other systems reviewed and are negative.  No flowsheet data found.     PHYSICAL EXAM:   VS:  BP 126/64   Pulse 92   Ht 4\' 10"  (1.473 m)   Wt 188 lb 3.2 oz (85.4 kg)   SpO2 97%   BMI 39.33 kg/m    GEN: Well nourished, well developed, in no acute distress  HEENT: normal  Neck: no JVD, carotid bruits, or masses Cardiac: RRR; no murmurs, rubs, or gallops,no edema.  Intact distal pulses bilaterally.  Respiratory:  clear to auscultation bilaterally, normal work of breathing GI: soft, nontender, nondistended, + BS MS: no deformity or atrophy  Skin: warm and dry, no rash Neuro:  Alert and Oriented x 3, Strength and sensation are intact Psych: euthymic mood, full affect  Wt Readings from Last 3 Encounters:  10/25/16 188 lb 3.2 oz (85.4 kg)  10/14/16 186 lb 3.2 oz (84.5 kg)  10/01/16 188 lb 8 oz (85.5 kg)      Studies/Labs Reviewed:   EKG:  EKG is not ordered today.    Recent Labs: 04/08/2016: ALT 22; BUN 8; Creatinine, Ser 0.57; Hemoglobin 13.2; Platelets 390; Potassium 3.3; Sodium 138; TSH 0.775   Lipid Panel    Component Value Date/Time   CHOL 232 (H) 04/08/2016 0730   TRIG 237 (H) 04/08/2016 0730   HDL 53 04/08/2016 0730   CHOLHDL 4.4 04/08/2016 0730   VLDL 47 (H) 04/08/2016 0730   LDLCALC 132 (H) 04/08/2016 0730    Additional studies/ records that were reviewed today include:  CPAP download    ASSESSMENT:    1. OSA (obstructive sleep apnea)   2. Obesity (BMI 30-39.9)      PLAN:  In order of problems listed above:  OSA - the patient is tolerating PAP therapy well  without any problems.  The patient has been using and benefiting from CPAP use and will continue to benefit from therapy. Her download a few weeks ago showed an AHI of 11.2/hr on 11cm H2O with only 2% compliance in using more than 4 hours nightly due to cough that is triggered after she puts the air on. She recently had PNA and has had reactive airways since.  I  encouraged her to use her inhaler prior to going to bed.  If her cough persists then she needs to see her PCP. She was supposed to get a 2 week autotitration done but it dose not appear that this was done.  I will order this.  I encouraged her to try to sleep on her side and avoid supine sleep.  She would also like a new machine as hers was a from an assistance program.  I will order a ResMed CPAP device.  2.  Obesity - I have encouraged her to get into a routine exercise program and cut back on carbs and portions.      Medication Adjustments/Labs and Tests Ordered: Current medicines are reviewed at length with the patient today.  Concerns regarding medicines are outlined above.  Medication changes, Labs and Tests ordered today are listed in the Patient Instructions below.  Patient Instructions  Medication Instructions:  Your physician recommends that you continue on your current medications as directed. Please refer to the Current Medication list given to you today.   Labwork: None  Testing/Procedures: None  Follow-Up: Your physician wants you to follow-up in: 1 year with Dr. Radford Pax. You will receive a reminder letter in the mail two months in advance. If you don't receive a letter, please call our office to schedule the follow-up appointment.   Any Other Special Instructions Will Be Listed Below (If Applicable). Dr. Radford Pax has ordered for you a 2 week auto-titration. Please call our CPAP assistant, Gae Bon, at [phone] if you do not hear from your medical equipment company in the next few days to arrange this.    If you need a  refill on your cardiac medications before your next appointment, please call your pharmacy.      Signed, Renee Him, MD  10/26/2016 7:34 AM    [facility] [place], [place], [place]  [address] Phone: [phone]; Fax: [phone]

## 2016-10-27 ENCOUNTER — Telehealth: Payer: Self-pay | Admitting: *Deleted

## 2016-10-27 NOTE — Progress Notes (Signed)
PPD Reading Note  PPD read and results entered in EpicCare.  Result: 0 mm induration.  Interpretation: NEGATIVE  If test not read within 48-72 hours of initial placement, patient advised to repeat in other arm 1-3 weeks after this test.  Allergic reaction: no

## 2016-10-27 NOTE — Telephone Encounter (Signed)
-----   Message from Renee Margarita, MD sent at 10/22/2016 12:50 PM EDT ----- Please find out if patient is using her CPAP.  Her compliance is way down and AHI it too high

## 2016-10-27 NOTE — Telephone Encounter (Signed)
Status:  Final result Visible to patient:  Yes (MyChart)  Notes recorded by Freada Bergeron, CMA on 10/27/2016 at 1:08 PM EDT Patient states she had pneumonia and could only use her machine for short periods of time.  Patient states when she stays up late or doesn't sleep at all she did not use her machine. Patient states she thought she was only suppose to only use her machine when she was in bed. Patient states she was advised by Dr Radford Pax to use her machine when she studies. Patient states Dr Radford Pax advised her to lay on her side when in bed and use her inhaler before she uses her cpap. Patient states that has helped.  ------  Notes recorded by Freada Bergeron, CMA on 10/27/2016 at 10:15 AM EDT Called LMTCB ------  Notes recorded by Freada Bergeron, CMA on 10/26/2016 at 10:16 AM EDT LMTCB ------  Notes recorded by Sueanne Margarita, MD on 10/22/2016 at 12:50 PM EDT Please find out if patient is using her CPAP. Her compliance is way down and AHI it too high

## 2016-10-29 ENCOUNTER — Telehealth: Payer: Self-pay | Admitting: Endocrinology

## 2016-10-29 ENCOUNTER — Telehealth: Payer: Self-pay | Admitting: *Deleted

## 2016-10-29 NOTE — Telephone Encounter (Signed)
-----   Message from Theodoro Parma, RN sent at 10/25/2016  3:50 PM EDT ----- Regarding: auto-titration Melissa- 2 week autotitration order is in  [place]- be on the look out for the results  Thanks!

## 2016-10-29 NOTE — Telephone Encounter (Signed)
Patient states that she needs her BD PEN NEEDLE NANO U/F 32G X 4 MM MISC [[id number]]   adjusted to 4 per day. Verified pharmacy on file

## 2016-11-01 ENCOUNTER — Other Ambulatory Visit: Payer: Self-pay

## 2016-11-01 DIAGNOSIS — G4733 Obstructive sleep apnea (adult) (pediatric): Secondary | ICD-10-CM | POA: Diagnosis not present

## 2016-11-01 MED ORDER — INSULIN PEN NEEDLE 32G X 4 MM MISC: 3 refills | Status: DC

## 2016-11-01 NOTE — Telephone Encounter (Signed)
Ordered

## 2016-11-03 DIAGNOSIS — G4733 Obstructive sleep apnea (adult) (pediatric): Secondary | ICD-10-CM | POA: Diagnosis not present

## 2016-11-05 ENCOUNTER — Other Ambulatory Visit: Payer: Self-pay | Admitting: Internal Medicine

## 2016-11-05 MED ORDER — INSULIN PEN NEEDLE 32G X 4 MM MISC: 3 refills | Status: DC

## 2016-11-05 NOTE — Telephone Encounter (Signed)
**  Remind patient they can make refill requests via MyChart**  Medication refill request (Name & Dosage):  Insulin Pen Needle (BD PEN NEEDLE NANO U/F) 32G X 4 MM MISC   Preferred pharmacy (Name & Address):  [id number] - [place], [address]  Other comments (if applicable):   Patient needs to know if she is supposed to increase dosage for  liraglutide (VICTOZA) 18 MG/3ML SOPN? Please call to discuss.

## 2016-11-05 NOTE — Telephone Encounter (Signed)
See message. I have submitted the pen needles, does she need to increase the victoza at this time?

## 2016-11-17 ENCOUNTER — Encounter: Payer: Self-pay | Admitting: Cardiology

## 2016-11-19 NOTE — Telephone Encounter (Signed)
-----   Message from Sueanne Margarita, MD sent at 11/18/2016  7:03 PM EDT ----- Please set CPAP at 18cm H2O and get a download in 2 weeks

## 2016-11-19 NOTE — Telephone Encounter (Signed)
[place] notified of new order and download in 2 weeks

## 2016-11-25 ENCOUNTER — Telehealth: Payer: Self-pay | Admitting: Cardiology

## 2016-11-25 DIAGNOSIS — G4733 Obstructive sleep apnea (adult) (pediatric): Secondary | ICD-10-CM

## 2016-11-25 NOTE — Telephone Encounter (Signed)
Patient called to see why [facility] called to schedule appointment with her. Reiterated to patient that Dr. Radford Pax placed an order to set her water pressure at 18cm, and they may need her to come in to change it if her machine settings can't be changed remotely. Patient states she got her machine from the CPAP Assistance Program (see 06/24/16 phone note). She now has insurance and requests to see if she qualifies for a new machine.  To CPAP assistant to contact DME to see eligibility for new machine and to follow-up with patient afterward. She does not want to schedule appointment with [facility] to change settings until she hears back with an update.

## 2016-11-25 NOTE — Telephone Encounter (Signed)
Pt some questions regarding [place] -pls call [phone]

## 2016-11-26 NOTE — Telephone Encounter (Signed)
Per [facility] Renee Lam) patient is eligible for a new machine they only need a new prescription for the new cpap unit with the settings and the recent face to face office notes. Patient has been notified and states she wants the new machine that has remote access because she is going to donate her old machine back to the assistance program.

## 2016-11-29 NOTE — Telephone Encounter (Signed)
Please order a ResMed CPAP at 18cm H2O with heated humidity

## 2016-12-02 ENCOUNTER — Encounter (HOSPITAL_COMMUNITY): Payer: Self-pay | Admitting: Family Medicine

## 2016-12-02 ENCOUNTER — Ambulatory Visit (HOSPITAL_COMMUNITY)
Admission: EM | Admit: 2016-12-02 | Discharge: 2016-12-02 | Disposition: A | Discharge status: Home / Self Care | Payer: BLUE CROSS/BLUE SHIELD | Attending: Family Medicine | Admitting: Family Medicine

## 2016-12-02 DIAGNOSIS — M62830 Muscle spasm of back: Secondary | ICD-10-CM

## 2016-12-02 NOTE — ED Triage Notes (Signed)
Pt here for continuous sharp pain in left flank area that started this am. Denies any N,V,D. Denis injury or heavy lifting. Denies any urinary symptoms.

## 2016-12-04 DIAGNOSIS — G4733 Obstructive sleep apnea (adult) (pediatric): Secondary | ICD-10-CM | POA: Diagnosis not present

## 2016-12-04 NOTE — ED Provider Notes (Signed)
  [place]   [id number] 12/02/16 Arrival Time: [place]:  1. Muscle spasm of back     OTC NSAIDs with food for 3-5 days. Ensure good ROM. Rec f/u with PCP if not improving in a few days. Reviewed expectations re: course of current medical issues. Questions answered. Outlined signs and symptoms indicating need for more acute intervention. Follow up here or in the Emergency Department if worsening. Patient verbalized understanding. After Visit Summary given.    SUBJECTIVE:  Renee Lam is a 56 y.o. female who presents with complaint of acute pain of her left mid back. Started today. Just sitting then felt the pain. Persistent and worsens with movements. No injury or trauma reported. No resp difficulties. Pain does not radiate. No extremity sensation changes. No n/v. OTC with mild help.  ROS: As per HPI.   OBJECTIVE:  Vitals:   12/02/16 1601  BP: 131/82  Pulse: 94  Resp: 18  Temp: 98.2 F (36.8 C)  SpO2: 98%     General appearance: alert, cooperative, appears stated age and no distress Head: Normocephalic, without obvious abnormality, atraumatic Neck: no adenopathy and supple, symmetrical, trachea midline Back: symmetric, no curvature. Very tender over L mid paraspinal musculature; no midline tenderness. Lungs: clear to auscultation bilaterally Chest wall: no tenderness Heart: regular rate and rhythm Abdomen: soft, non-tender; bowel sounds normal; no masses,  no organomegaly; no guarding or rebound tenderness    Allergies  Allergen Reactions  . Food     Strawberries, mangoes, pineapple, eggplant, zucchini, macadamia nuts   . Penicillins Hives  . Zithromax [Azithromycin] Hives    z-max; states can take Z pack    PMHx, SurgHx, SocialHx, Medications, and Allergies were reviewed in the Visit Navigator and updated as appropriate.       Vanessa Kick, MD 12/04/16 [phone]

## 2016-12-06 ENCOUNTER — Telehealth: Payer: Self-pay | Admitting: Cardiology

## 2016-12-06 DIAGNOSIS — G4733 Obstructive sleep apnea (adult) (pediatric): Secondary | ICD-10-CM

## 2016-12-06 NOTE — Telephone Encounter (Signed)
Will forward to Dr. Theodosia Blender sleep assistant.

## 2016-12-06 NOTE — Telephone Encounter (Signed)
New message    Pt is calling asking for a call back after lunch, she is class this morning. She said her number on CPAP machine was raised and she is having some trouble with it.

## 2016-12-07 NOTE — Telephone Encounter (Signed)
Patient is not tolerating the 18cm pressure. Patient has a basic unit can not be set at a variable pressure. Pressure was previously set on 11cm in January of 2018. Patient says she can not continue to wear her mask at that pressure.

## 2016-12-08 NOTE — Telephone Encounter (Signed)
Dr Radford Pax [facility] ([place]) says they did a 2 week auto titration on the patient May 21 to June 6 before she got her new unit. Per [facility] if approved by the manager and an order from Dr Radford Pax she might be able to be switched from her basic unit to an auto unit since it is still early since she got her new machine.

## 2016-12-08 NOTE — Telephone Encounter (Signed)
Please find out if DME will switch out machine for 2 weeks to do an autotitration

## 2016-12-08 NOTE — Telephone Encounter (Addendum)
2 week auto titration results and download received (5-21 to 6/6) and scanned in epic.

## 2016-12-08 NOTE — Telephone Encounter (Signed)
As patient is not tolerating a set pressure of 18cm H2O after autotitration please place patient on auto CPAP from 5 to 20cm H2O and have her call in 2 weeks to let us know if the auto works better for her - her AHI was good on autoCPAP

## 2016-12-16 NOTE — Telephone Encounter (Signed)
2 week autotitration ordered for patient and [facility] will fax download to Dr Radford Pax.

## 2016-12-17 ENCOUNTER — Encounter: Payer: Self-pay | Admitting: Internal Medicine

## 2016-12-17 ENCOUNTER — Ambulatory Visit (INDEPENDENT_AMBULATORY_CARE_PROVIDER_SITE_OTHER): Payer: BLUE CROSS/BLUE SHIELD | Admitting: Internal Medicine

## 2016-12-17 VITALS — BP 110/72 | HR 94 | Temp 98.5°F | Wt 186.0 lb

## 2016-12-17 DIAGNOSIS — E1121 Type 2 diabetes mellitus with diabetic nephropathy: Secondary | ICD-10-CM | POA: Diagnosis not present

## 2016-12-17 DIAGNOSIS — E7800 Pure hypercholesterolemia, unspecified: Secondary | ICD-10-CM | POA: Diagnosis not present

## 2016-12-17 DIAGNOSIS — J4520 Mild intermittent asthma, uncomplicated: Secondary | ICD-10-CM | POA: Diagnosis not present

## 2016-12-17 DIAGNOSIS — Z794 Long term (current) use of insulin: Secondary | ICD-10-CM | POA: Diagnosis not present

## 2016-12-17 NOTE — Patient Instructions (Signed)
Limit your sodium (Salt) intake   Please check your hemoglobin A1c every 3 months    It is important that you exercise regularly, at least 20 minutes 3 to 4 times per week.  If you develop chest pain or shortness of breath seek  medical attention.  You need to lose weight.  Consider a lower calorie diet and regular exercise. 

## 2016-12-17 NOTE — Progress Notes (Signed)
Subjective:    Patient ID: Renee Lam, female    DOB: 1961-03-29, 56 y.o.   MRN: [id number]  HPI  56 year old patient who is followed by endocrine with type 2 diabetes, insulin-requiring.  Hemoglobin A1c has been trending down. She was seen by ophthalmology recently.  No diabetic retinopathy, but felt there were some hypertensive changes.  No history of hypertension She remains on high intensity statin therapy for significant hypercholesterolemia since her last visit.  GLP-1 therapy has been started.  She continues to tolerate this well  Wt Readings from Last 3 Encounters:  12/17/16 186 lb (84.4 kg)  10/25/16 188 lb 3.2 oz (85.4 kg)  10/14/16 186 lb 3.2 oz (84.5 kg)    Past Medical History:  Diagnosis Date  . Allergy   . Arthritis   . Asthma   . Chicken pox   . Diabetes mellitus   . Finger infection 2008   isolation  . GERD (gastroesophageal reflux disease)   . Heart murmur    benign per patient  . Hyperlipidemia   . Hypertension   . Migraine   . OSA (obstructive sleep apnea) 10/25/2016  . Osteoporosis   . Pericarditis   . Sleep apnea    Patient is scheduled for sleep study tonight  . Viral meningitis      Social History   Social History  . Marital status: Divorced    Spouse name: N/A  . Number of children: N/A  . Years of education: N/A   Occupational History  . Not on file.   Social History Main Topics  . Smoking status: Never Smoker  . Smokeless tobacco: Never Used  . Alcohol use Yes     Comment: once a year  . Drug use: No  . Sexual activity: Yes    Birth control/ protection: Post-menopausal   Other Topics Concern  . Not on file   Social History Narrative  . No narrative on file    Past Surgical History:  Procedure Laterality Date  . CARDIAC CATHETERIZATION  2012  . CESAREAN SECTION     1997, 2000  . KNEE ARTHROSCOPY Right 2010  . ORIF HUMERUS FRACTURE Right 03/25/2015   Procedure: OPEN REDUCTION INTERNAL FIXATION (ORIF) DISTAL  HUMERUS FRACTURE;  Surgeon: Renette Butters, MD;  Location: [facility];  Service: Orthopedics;  Laterality: Right;    Family History  Problem Relation Age of Onset  . Colon polyps Mother   . Diabetes Mother   . Heart failure Mother   . Peripheral vascular disease Mother   . Diabetes Father   . Heart disease Father   . Arthritis Father   . Hypertension Father   . Heart failure Father   . Prostate cancer Father   . Arthritis Other   . Hyperlipidemia Other   . Heart disease Other   . Hypertension Other   . Depression Other   . Ovarian cancer Other   . Uterine cancer Other   . Breast cancer Other   . Prostate cancer Other   . Breast cancer Cousin   . Colon cancer Neg Hx   . Stomach cancer Neg Hx   . Rectal cancer Neg Hx   . Pancreatic cancer Neg Hx       Current Outpatient Prescriptions on File Prior to Visit  Medication Sig Dispense Refill  . albuterol (PROVENTIL HFA;VENTOLIN HFA) 108 (90 Base) MCG/ACT inhaler Inhale 2 puffs into the lungs every 4 (four) hours as needed. For shortness  of breath/wheezing 1 Inhaler 12  . atorvastatin (LIPITOR) 80 MG tablet Take 1 tablet (80 mg total) by mouth at bedtime. 90 tablet 3  . BAYER CONTOUR NEXT TEST test strip USE TO TEST FOUR TIMES A DAY AS INSTRUCTED.  12  . diphenhydrAMINE (BENADRYL) 25 MG tablet Take 25 mg by mouth every 6 (six) hours as needed for allergies.     Marland Kitchen EPINEPHrine 0.3 mg/0.3 mL IJ SOAJ injection Inject 0.3 mLs (0.3 mg total) into the muscle once. 1 Device 1  . Fluticasone-Salmeterol (ADVAIR DISKUS) 250-50 MCG/DOSE AEPB Inhale 1 puff into the lungs 2 (two) times daily. 60 each 11  . Insulin Pen Needle (BD PEN NEEDLE NANO U/F) 32G X 4 MM MISC USE AS DIRECTED 4 TIMES A DAY 390 each 3  . LANTUS SOLOSTAR 100 UNIT/ML Solostar Pen INJECT 190 UNITS SUBCUTANEOUSLY EVERY MORNING 60 pen 3  . liraglutide (VICTOZA) 18 MG/3ML SOPN Inject 0.3 mLs (1.8 mg total) into the skin daily. 9 mL 11  . montelukast (SINGULAIR)  10 MG tablet Take 1 tablet (10 mg total) by mouth at bedtime. 30 tablet 3   Current Facility-Administered Medications on File Prior to Visit  Medication Dose Route Frequency Provider Last Rate Last Dose  . 0.9 %  sodium chloride infusion  500 mL Intravenous Continuous Irene Shipper, MD        BP 110/72 (BP Location: Left Arm, Patient Position: Sitting, Cuff Size: Normal)   Pulse 94   Temp 98.5 F (36.9 C) (Oral)   Wt 186 lb (84.4 kg)   SpO2 98%   BMI 38.87 kg/m     Review of Systems  Constitutional: Negative.   HENT: Negative for congestion, dental problem, hearing loss, rhinorrhea, sinus pressure, sore throat and tinnitus.   Eyes: Negative for pain, discharge and visual disturbance.  Respiratory: Negative for cough and shortness of breath.   Cardiovascular: Negative for chest pain, palpitations and leg swelling.  Gastrointestinal: Negative for abdominal distention, abdominal pain, blood in stool, constipation, diarrhea, nausea and vomiting.  Genitourinary: Negative for difficulty urinating, dysuria, flank pain, frequency, hematuria, pelvic pain, urgency, vaginal bleeding, vaginal discharge and vaginal pain.  Musculoskeletal: Negative for arthralgias, gait problem and joint swelling.  Skin: Negative for rash.  Neurological: Negative for dizziness, syncope, speech difficulty, weakness, numbness and headaches.  Hematological: Negative for adenopathy.  Psychiatric/Behavioral: Negative for agitation, behavioral problems and dysphoric mood. The patient is not nervous/anxious.        Objective:   Physical Exam  Constitutional: She is oriented to person, place, and time. She appears well-developed and well-nourished.  Overweight Pressure 112 over 84  Weight 186  HENT:  Head: Normocephalic.  Right Ear: External ear normal.  Left Ear: External ear normal.  Mouth/Throat: Oropharynx is clear and moist.  Eyes: Conjunctivae and EOM are normal. Pupils are equal, round, and reactive to  light.  Neck: Normal range of motion. Neck supple. No thyromegaly present.  Cardiovascular: Normal rate, regular rhythm, normal heart sounds and intact distal pulses.   Pulmonary/Chest: Effort normal and breath sounds normal.  Abdominal: Soft. Bowel sounds are normal. She exhibits no mass. There is no tenderness.  Musculoskeletal: Normal range of motion.  Lymphadenopathy:    She has no cervical adenopathy.  Neurological: She is alert and oriented to person, place, and time.  Skin: Skin is warm and dry. No rash noted.  Psychiatric: She has a normal mood and affect. Her behavior is normal.  Assessment & Plan:   Diabetes mellitus.  Continues to improve.  Follow-up endocrinology as scheduled Asthma, stable Dyslipidemia.  Continue high intensity statin  Follow-up 6 months  Canden Cieslinski Pilar Plate

## 2016-12-22 ENCOUNTER — Ambulatory Visit (INDEPENDENT_AMBULATORY_CARE_PROVIDER_SITE_OTHER): Payer: BLUE CROSS/BLUE SHIELD | Admitting: Endocrinology

## 2016-12-22 ENCOUNTER — Other Ambulatory Visit: Payer: Self-pay | Admitting: Internal Medicine

## 2016-12-22 DIAGNOSIS — E1121 Type 2 diabetes mellitus with diabetic nephropathy: Secondary | ICD-10-CM | POA: Diagnosis not present

## 2016-12-22 DIAGNOSIS — Z794 Long term (current) use of insulin: Secondary | ICD-10-CM

## 2016-12-22 LAB — POCT GLYCOSYLATED HEMOGLOBIN (HGB A1C): Hemoglobin A1C: 7.6

## 2016-12-22 NOTE — Patient Instructions (Addendum)
check your blood sugar twice a day.  vary the time of day when you check, between before the 3 meals, and at bedtime.  also check if you have symptoms of your blood sugar being too high or too low.  please keep a record of the readings and bring it to your next appointment here.  You can write it on any piece of paper.  please call us sooner if your blood sugar goes below 70, or if you have a lot of readings over 200.  Please continue the same medications for diabetes.     Please come back for a follow-up appointment in 3 months.

## 2016-12-22 NOTE — Progress Notes (Signed)
Subjective:    Patient ID: Renee Lam, female    DOB: 01-Dec-1960, 56 y.o.   MRN: [id number]  HPI Pt returns for f/u of diabetes mellitus: DM type: Insulin-requiring type 2 Dx'ed: 1517 Complications: polyneuropathy and retinopathy. Therapy: insulin since 2010, and victoza GDM: 1996 and 1999.  DKA: never.  Severe hypoglycemia: never.     Pancreatitis: never.  Other: she takes QD insulin, after poor results with multiple daily injections.   Interval history: She says she never missed her meds.  no cbg record, but states cbg's vary from 42-166.  She does not recall any trend throughout the day.  pt states she feels well in general.    Past Medical History:  Diagnosis Date  . Allergy   . Arthritis   . Asthma   . Chicken pox   . Diabetes mellitus   . Finger infection 2008   isolation  . GERD (gastroesophageal reflux disease)   . Heart murmur    benign per patient  . Hyperlipidemia   . Hypertension   . Migraine   . OSA (obstructive sleep apnea) 10/25/2016  . Osteoporosis   . Pericarditis   . Sleep apnea    Patient is scheduled for sleep study tonight  . Viral meningitis     Past Surgical History:  Procedure Laterality Date  . CARDIAC CATHETERIZATION  2012  . CESAREAN SECTION     1997, 2000  . KNEE ARTHROSCOPY Right 2010  . ORIF HUMERUS FRACTURE Right 03/25/2015   Procedure: OPEN REDUCTION INTERNAL FIXATION (ORIF) DISTAL HUMERUS FRACTURE;  Surgeon: Renette Butters, MD;  Location: [facility];  Service: Orthopedics;  Laterality: Right;    Social History   Social History  . Marital status: Divorced    Spouse name: N/A  . Number of children: N/A  . Years of education: N/A   Occupational History  . Not on file.   Social History Main Topics  . Smoking status: Never Smoker  . Smokeless tobacco: Never Used  . Alcohol use Yes     Comment: once a year  . Drug use: No  . Sexual activity: Yes    Birth control/ protection: Post-menopausal    Other Topics Concern  . Not on file   Social History Narrative  . No narrative on file    Current Outpatient Prescriptions on File Prior to Visit  Medication Sig Dispense Refill  . albuterol (PROVENTIL HFA;VENTOLIN HFA) 108 (90 Base) MCG/ACT inhaler Inhale 2 puffs into the lungs every 4 (four) hours as needed. For shortness of breath/wheezing 1 Inhaler 12  . atorvastatin (LIPITOR) 80 MG tablet Take 1 tablet (80 mg total) by mouth at bedtime. 90 tablet 3  . BAYER CONTOUR NEXT TEST test strip USE TO TEST FOUR TIMES A DAY AS INSTRUCTED.  12  . diphenhydrAMINE (BENADRYL) 25 MG tablet Take 25 mg by mouth every 6 (six) hours as needed for allergies.     Marland Kitchen EPINEPHrine 0.3 mg/0.3 mL IJ SOAJ injection Inject 0.3 mLs (0.3 mg total) into the muscle once. 1 Device 1  . Fluticasone-Salmeterol (ADVAIR DISKUS) 250-50 MCG/DOSE AEPB Inhale 1 puff into the lungs 2 (two) times daily. 60 each 11  . Insulin Pen Needle (BD PEN NEEDLE NANO U/F) 32G X 4 MM MISC USE AS DIRECTED 4 TIMES A DAY 390 each 3  . LANTUS SOLOSTAR 100 UNIT/ML Solostar Pen INJECT 190 UNITS SUBCUTANEOUSLY EVERY MORNING 60 pen 3  . liraglutide (VICTOZA) 18 MG/3ML SOPN Inject  0.3 mLs (1.8 mg total) into the skin daily. 9 mL 11  . montelukast (SINGULAIR) 10 MG tablet Take 1 tablet (10 mg total) by mouth at bedtime. 30 tablet 3   Current Facility-Administered Medications on File Prior to Visit  Medication Dose Route Frequency Provider Last Rate Last Dose  . 0.9 %  sodium chloride infusion  500 mL Intravenous Continuous Irene Shipper, MD          Family History  Problem Relation Age of Onset  . Colon polyps Mother   . Diabetes Mother   . Heart failure Mother   . Peripheral vascular disease Mother   . Diabetes Father   . Heart disease Father   . Arthritis Father   . Hypertension Father   . Heart failure Father   . Prostate cancer Father   . Arthritis Other   . Hyperlipidemia Other   . Heart disease Other   . Hypertension Other    . Depression Other   . Ovarian cancer Other   . Uterine cancer Other   . Breast cancer Other   . Prostate cancer Other   . Breast cancer Cousin   . Colon cancer Neg Hx   . Stomach cancer Neg Hx   . Rectal cancer Neg Hx   . Pancreatic cancer Neg Hx     There were no vitals taken for this visit.   Review of Systems No weight change.  She denies LOC.     Objective:   Physical Exam GENERAL: no distress Pulses: dorsalis pedis intact bilat.   MSK: no deformity of the feet CV: no leg edema Skin:  no ulcer on the feet.  normal color and temp on the feet. Neuro: sensation is intact to touch on the feet.    A1c=7.6%    Assessment & Plan:  Insulin-requiring type 2 DM, with DR: this is the best control this pt should aim for, given this regimen, which does match insulin to her changing needs throughout the day  Patient Instructions  check your blood sugar twice a day.  vary the time of day when you check, between before the 3 meals, and at bedtime.  also check if you have symptoms of your blood sugar being too high or too low.  please keep a record of the readings and bring it to your next appointment here.  You can write it on any piece of paper.  please call us sooner if your blood sugar goes below 70, or if you have a lot of readings over 200.  Please continue the same medications for diabetes.     Please come back for a follow-up appointment in 3 months.

## 2016-12-29 ENCOUNTER — Ambulatory Visit: Payer: BLUE CROSS/BLUE SHIELD | Admitting: Endocrinology

## 2017-01-03 DIAGNOSIS — G4733 Obstructive sleep apnea (adult) (pediatric): Secondary | ICD-10-CM | POA: Diagnosis not present

## 2017-01-04 ENCOUNTER — Telehealth: Payer: Self-pay | Admitting: *Deleted

## 2017-01-04 NOTE — Telephone Encounter (Signed)
Patient has a compliance range of 01/04/17 - 03/05/17. Patient has a 10 week appointment scheduled for February 17 2017.

## 2017-01-05 ENCOUNTER — Encounter: Payer: Self-pay | Admitting: Cardiology

## 2017-01-13 ENCOUNTER — Other Ambulatory Visit: Payer: Self-pay | Admitting: Internal Medicine

## 2017-01-14 ENCOUNTER — Telehealth: Payer: Self-pay | Admitting: Cardiology

## 2017-01-14 ENCOUNTER — Telehealth: Payer: Self-pay | Admitting: *Deleted

## 2017-01-14 NOTE — Telephone Encounter (Signed)
Informed patient of sleep study results and patient understanding was verbalized. Patient states she has been compliant she got a message from the CPAP company that they had done an update. She then got a email from [facility]) that she was missing 3 days at a time and she told them about the update so [facility] advised her to unplug her cpap and plug it back in. After 2 days [facility] called to say she was compliant and they could see all days that were missing. Patient doesn't want the settings changed she wants to keep the auto setting because it works better for her. Will repeat D/L in 2 weeks 01/29/17.

## 2017-01-14 NOTE — Telephone Encounter (Addendum)
Informed patient of sleep study results and patient understanding was verbalized. Patient states she has been compliant she got a message from the CPAP company that they had done an update. She then got a email from [facility]) that she was missing 3 days at a time and she told them about the update so [facility] advised her to unplug her cpap and plug it back in. After 2 days [facility] called to say she was compliant and they could see all days that were missing. Patient doesn't want the settings changed she wants to keep the auto setting because it works better for her. Will repeat D/L in 2 weeks 01/29/17.

## 2017-01-14 NOTE — Telephone Encounter (Signed)
Patient calling, states that she is returning your call and is available anytime now.

## 2017-01-14 NOTE — Telephone Encounter (Signed)
-----   Message from Sueanne Margarita, MD sent at 01/11/2017  7:48 PM EDT ----- Please set CPAP at 16cm H2O and encouraged increased compliance.  Please get repeat download in 2 weeks

## 2017-01-17 NOTE — Telephone Encounter (Signed)
Ok to keep auto CPAP settings

## 2017-01-18 ENCOUNTER — Telehealth: Payer: Self-pay | Admitting: *Deleted

## 2017-01-18 ENCOUNTER — Encounter: Payer: Self-pay | Admitting: *Deleted

## 2017-01-18 NOTE — Telephone Encounter (Signed)
-----   Message from [place] sent at 01/17/2017 12:40 PM EDT ----- Regarding: RE: new order I don't see an order for this one.  Did I miss it?  Thank you! ----- Message ----- From: Freada Bergeron, CMA Sent: 01/14/2017  10:27 AM To: Jiles Crocker, Darlina Guys Subject: new order                                      Please set CPAP at 16cm H2O  Please get repeat download in 2 weeks

## 2017-01-18 NOTE — Telephone Encounter (Signed)
This encounter was created in error - please disregard.

## 2017-01-18 NOTE — Telephone Encounter (Deleted)
-----   Message from [place] sent at 01/17/2017 12:40 PM EDT ----- Regarding: RE: new order I don't see an order for this one.  Did I miss it?  Thank you! ----- Message ----- From: Freada Bergeron, CMA Sent: 01/14/2017  10:27 AM To: Jiles Crocker, Darlina Guys Subject: new order                                      Please set CPAP at 16cm H2O  Please get repeat download in 2 weeks

## 2017-01-18 NOTE — Telephone Encounter (Signed)
January 17, 2017  Sueanne Margarita, MD  to Me    8:44 AM  Note    Ok to keep auto CPAP settings     Renee Lam, disregard patient wants to keep her auto CPAP settings.

## 2017-01-21 ENCOUNTER — Encounter: Payer: Self-pay | Admitting: Family Medicine

## 2017-01-21 ENCOUNTER — Ambulatory Visit (INDEPENDENT_AMBULATORY_CARE_PROVIDER_SITE_OTHER): Payer: BLUE CROSS/BLUE SHIELD | Admitting: Family Medicine

## 2017-01-21 VITALS — BP 104/80 | HR 87 | Temp 98.5°F | Ht <= 58 in | Wt 188.4 lb

## 2017-01-21 DIAGNOSIS — M545 Low back pain, unspecified: Secondary | ICD-10-CM

## 2017-01-21 MED ORDER — CYCLOBENZAPRINE HCL 5 MG PO TABS: 5.0000 mg | ORAL_TABLET | Freq: Every evening | ORAL | 0 refills | Status: DC | PRN

## 2017-01-21 NOTE — Progress Notes (Signed)
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 and that she and several for classmates have developed back pain from sitting in these chairs -Pain is moderate to severe, bilateral in the lower thoracic thoracic and lumbar back paraspinal muscles -Occasionally she feels like the pain is also wrapping laterally around the bra line -Denies fevers, trauma, malaise, change in bowels, nausea, vomiting, dysuria, hematuria, bowel or bladder dysfunction, weakness or numbness -She has tried over-the-counter analgesics which do not help much -Denies any underlying musculoskeletal conditions or back issues  ROS: See pertinent positives and negatives per HPI.  Past Medical History:  Diagnosis Date  . Allergy   . Arthritis   . Asthma   . Chicken pox   . Diabetes mellitus   . Finger infection 2008   isolation  . GERD (gastroesophageal reflux disease)   . Heart murmur    benign per patient  . Hyperlipidemia   . Hypertension   . Migraine   . OSA (obstructive sleep apnea) 10/25/2016  . Osteoporosis   . Pericarditis   . Sleep apnea    Patient is scheduled for sleep study tonight  . Viral meningitis     Past Surgical History:  Procedure Laterality Date  . CARDIAC CATHETERIZATION  2012  . CESAREAN SECTION     1997, 2000  . KNEE ARTHROSCOPY Right 2010  . ORIF HUMERUS FRACTURE Right 03/25/2015   Procedure: OPEN REDUCTION INTERNAL FIXATION (ORIF) DISTAL HUMERUS FRACTURE;  Surgeon: Renette Butters, MD;  Location: [place];  Service: Orthopedics;  Laterality: Right;    Family History  Problem Relation Age of Onset  . Colon polyps Mother   . Diabetes Mother   . Heart failure Mother   . Peripheral vascular disease Mother   . Diabetes Father   . Heart disease Father   . Arthritis Father   . Hypertension Father   . Heart failure Father   . Prostate cancer Father   . Arthritis Other   .  Hyperlipidemia Other   . Heart disease Other   . Hypertension Other   . Depression Other   . Ovarian cancer Other   . Uterine cancer Other   . Breast cancer Other   . Prostate cancer Other   . Breast cancer Cousin   . Colon cancer Neg Hx   . Stomach cancer Neg Hx   . Rectal cancer Neg Hx   . Pancreatic cancer Neg Hx     Social History   Social History  . Marital status: Divorced    Spouse name: N/A  . Number of children: N/A  . Years of education: N/A   Social History Main Topics  . Smoking status: Never Smoker  . Smokeless tobacco: Never Used  . Alcohol use Yes     Comment: once a year  . Drug use: No  . Sexual activity: Yes    Birth control/ protection: Post-menopausal   Other Topics Concern  . None   Social History Narrative  . None     Current Outpatient Prescriptions:  .  albuterol (PROVENTIL HFA;VENTOLIN HFA) 108 (90 Base) MCG/ACT inhaler, Inhale 2 puffs into the lungs every 4 (four) hours as needed. For shortness of breath/wheezing, Disp: 1 Inhaler, Rfl: 12 .  atorvastatin (LIPITOR) 80 MG tablet, TAKE 1 TABLET AT BEDTIME, Disp: 90 tablet, Rfl: 2 .  BAYER CONTOUR NEXT TEST test strip, USE TO  TEST FOUR TIMES A DAY AS INSTRUCTED., Disp: , Rfl: 12 .  CONTOUR NEXT TEST test strip, USE TO TEST FOUR TIMES A DAY AS INSTRUCTED., Disp: 100 each, Rfl: 10 .  diphenhydrAMINE (BENADRYL) 25 MG tablet, Take 25 mg by mouth every 6 (six) hours as needed for allergies. , Disp: , Rfl:  .  EPINEPHrine 0.3 mg/0.3 mL IJ SOAJ injection, Inject 0.3 mLs (0.3 mg total) into the muscle once., Disp: 1 Device, Rfl: 1 .  Fluticasone-Salmeterol (ADVAIR DISKUS) 250-50 MCG/DOSE AEPB, Inhale 1 puff into the lungs 2 (two) times daily., Disp: 60 each, Rfl: 11 .  Insulin Pen Needle (BD PEN NEEDLE NANO U/F) 32G X 4 MM MISC, USE AS DIRECTED 4 TIMES A DAY, Disp: 390 each, Rfl: 3 .  LANTUS SOLOSTAR 100 UNIT/ML Solostar Pen, INJECT 190 UNITS SUBCUTANEOUSLY EVERY MORNING, Disp: 60 pen, Rfl: 3 .   liraglutide (VICTOZA) 18 MG/3ML SOPN, Inject 0.3 mLs (1.8 mg total) into the skin daily., Disp: 9 mL, Rfl: 11 .  montelukast (SINGULAIR) 10 MG tablet, Take 1 tablet (10 mg total) by mouth at bedtime., Disp: 30 tablet, Rfl: 3 .  cyclobenzaprine (FLEXERIL) 5 MG tablet, Take 1 tablet (5 mg total) by mouth at bedtime as needed for muscle spasms., Disp: 20 tablet, Rfl: 0  Current Facility-Administered Medications:  .  0.9 %  sodium chloride infusion, 500 mL, Intravenous, Continuous, Irene Shipper, MD  EXAM:  Vitals:   01/21/17 1624  BP: 104/80  Pulse: 87  Temp: 98.5 F (36.9 C)    Body mass index is 39.38 kg/m.  GENERAL: vitals reviewed and listed above, alert, oriented, appears well hydrated and in no acute distress  NECK: no obvious masses on inspection  LUNGS: clear to auscultation bilaterally, no wheezes, rales or rhonchi, good air movement  CV: HRRR, no peripheral edema  ABD: Bowel sounds positive in all 4 quadrants, soft, nontender to palpation, no rebound or guarding  SKIN: No rash or abnormal lesions on the skin in the back region/flank region/abdomen  MS: moves all extremities without noticeable abnormality, she has significant muscle tension in the bilateral trapezius muscle region, right thoracic paraspinal muscles, bilateral lumbar paraspinal muscles with some tenderness to palpation in the lower right thoracic paraspinal muscles and bilateral lumbar paraspinal muscles, her right shoulder is elevated compared to the left, T7 through T12 RrSl, neurovascular intact in the lower extremities  PSYCH: pleasant and cooperative, no obvious depression or anxiety  ASSESSMENT AND PLAN:  Discussed the following assessment and plan:  Acute bilateral low back and thoracic pain without sciatica  -we discussed possible serious and likely etiologies, workup and treatment, treatment risks and return precautions - given her description of symptoms and the findings on exam a  musculoskeletal etiology is most likely -after this discussion, Bobbie Virden opted for muscle relaxer to take at night, home exercise program, stretching and moving during the study times  - avoidance of long periods of sitting without moving , symptomatic care per patient instructions and close follow-up with her primary care provider -follow up advised in 1-2 weeks -of course, we advised Andersyn Fragoso  to return or notify a doctor immediately if symptoms worsen or persist or new concerns arise. Patient Instructions  BEFORE YOU LEAVE: -follow up: In 1-2 weeks -Low back exercises  Please do the exercises provided 4 days per week.  For the pain you can try the following: -Heat for 15 minutes twice daily -Topical menthol (Tiger balm is a good product) -Aleve  or Tylenol per instructions as needed -Flexeril at night as needed  I hope you are feeling better soon! Seek care sooner if worsening or new concerns.      Colin Benton R., DO

## 2017-01-21 NOTE — Patient Instructions (Signed)
BEFORE YOU LEAVE: -follow up: In 1-2 weeks -Low back exercises  Please do the exercises provided 4 days per week.  For the pain you can try the following: -Heat for 15 minutes twice daily -Topical menthol (Tiger balm is a good product) -Aleve or Tylenol per instructions as needed -Flexeril at night as needed  I hope you are feeling better soon! Seek care sooner if worsening or new concerns.

## 2017-01-25 DIAGNOSIS — S338XXA Sprain of other parts of lumbar spine and pelvis, initial encounter: Secondary | ICD-10-CM | POA: Diagnosis not present

## 2017-01-26 NOTE — Telephone Encounter (Signed)
Per Dr Radford Pax orders changed to keep auto setting. Calls   Sueanne Margarita, MD  You 9 days ago     [facility] to keep auto CPAP settings      Documentation

## 2017-01-27 DIAGNOSIS — S338XXA Sprain of other parts of lumbar spine and pelvis, initial encounter: Secondary | ICD-10-CM | POA: Diagnosis not present

## 2017-02-01 DIAGNOSIS — S338XXA Sprain of other parts of lumbar spine and pelvis, initial encounter: Secondary | ICD-10-CM | POA: Diagnosis not present

## 2017-02-03 DIAGNOSIS — G4733 Obstructive sleep apnea (adult) (pediatric): Secondary | ICD-10-CM | POA: Diagnosis not present

## 2017-02-04 ENCOUNTER — Encounter: Payer: Self-pay | Admitting: Internal Medicine

## 2017-02-04 ENCOUNTER — Ambulatory Visit (INDEPENDENT_AMBULATORY_CARE_PROVIDER_SITE_OTHER): Payer: BLUE CROSS/BLUE SHIELD | Admitting: Internal Medicine

## 2017-02-04 VITALS — BP 112/68 | HR 87 | Temp 98.5°F | Ht <= 58 in | Wt 183.4 lb

## 2017-02-04 DIAGNOSIS — E7800 Pure hypercholesterolemia, unspecified: Secondary | ICD-10-CM | POA: Diagnosis not present

## 2017-02-04 DIAGNOSIS — E1121 Type 2 diabetes mellitus with diabetic nephropathy: Secondary | ICD-10-CM | POA: Diagnosis not present

## 2017-02-04 NOTE — Progress Notes (Signed)
Subjective:    Patient ID: Renee Lam, female    DOB: 27-Jul-1960, 56 y.o.   MRN: [id number]  HPI  Lab Results  Component Value Date   HGBA1C 7.6 12/22/2016   56 year old patient who is seen today in follow-up.  She was seen recently with acute low back pain that has nicely resolved. She has completed training in nursing school and anticipates taking a certified examination soon No concerns or complaints. She is followed by endocrinology and hemoglobin A1c continues to trend down  Past Medical History:  Diagnosis Date  . Allergy   . Arthritis   . Asthma   . Chicken pox   . Diabetes mellitus   . Finger infection 2008   isolation  . GERD (gastroesophageal reflux disease)   . Heart murmur    benign per patient  . Hyperlipidemia   . Hypertension   . Migraine   . OSA (obstructive sleep apnea) 10/25/2016  . Osteoporosis   . Pericarditis   . Sleep apnea    Patient is scheduled for sleep study tonight  . Viral meningitis      Social History   Social History  . Marital status: Divorced    Spouse name: N/A  . Number of children: N/A  . Years of education: N/A   Occupational History  . Not on file.   Social History Main Topics  . Smoking status: Never Smoker  . Smokeless tobacco: Never Used  . Alcohol use Yes     Comment: once a year  . Drug use: No  . Sexual activity: Yes    Birth control/ protection: Post-menopausal   Other Topics Concern  . Not on file   Social History Narrative  . No narrative on file    Past Surgical History:  Procedure Laterality Date  . CARDIAC CATHETERIZATION  2012  . CESAREAN SECTION     1997, 2000  . KNEE ARTHROSCOPY Right 2010  . ORIF HUMERUS FRACTURE Right 03/25/2015   Procedure: OPEN REDUCTION INTERNAL FIXATION (ORIF) DISTAL HUMERUS FRACTURE;  Surgeon: Renette Butters, MD;  Location: [facility];  Service: Orthopedics;  Laterality: Right;    Family History  Problem Relation Age of Onset  . Colon  polyps Mother   . Diabetes Mother   . Heart failure Mother   . Peripheral vascular disease Mother   . Diabetes Father   . Heart disease Father   . Arthritis Father   . Hypertension Father   . Heart failure Father   . Prostate cancer Father   . Arthritis Other   . Hyperlipidemia Other   . Heart disease Other   . Hypertension Other   . Depression Other   . Ovarian cancer Other   . Uterine cancer Other   . Breast cancer Other   . Prostate cancer Other   . Breast cancer Cousin   . Colon cancer Neg Hx   . Stomach cancer Neg Hx   . Rectal cancer Neg Hx   . Pancreatic cancer Neg Hx       Current Outpatient Prescriptions on File Prior to Visit  Medication Sig Dispense Refill  . albuterol (PROVENTIL HFA;VENTOLIN HFA) 108 (90 Base) MCG/ACT inhaler Inhale 2 puffs into the lungs every 4 (four) hours as needed. For shortness of breath/wheezing 1 Inhaler 12  . atorvastatin (LIPITOR) 80 MG tablet TAKE 1 TABLET AT BEDTIME 90 tablet 2  . BAYER CONTOUR NEXT TEST test strip USE TO TEST FOUR TIMES A  DAY AS INSTRUCTED.  12  . CONTOUR NEXT TEST test strip USE TO TEST FOUR TIMES A DAY AS INSTRUCTED. 100 each 10  . cyclobenzaprine (FLEXERIL) 5 MG tablet Take 1 tablet (5 mg total) by mouth at bedtime as needed for muscle spasms. 20 tablet 0  . diphenhydrAMINE (BENADRYL) 25 MG tablet Take 25 mg by mouth every 6 (six) hours as needed for allergies.     Marland Kitchen EPINEPHrine 0.3 mg/0.3 mL IJ SOAJ injection Inject 0.3 mLs (0.3 mg total) into the muscle once. 1 Device 1  . Fluticasone-Salmeterol (ADVAIR DISKUS) 250-50 MCG/DOSE AEPB Inhale 1 puff into the lungs 2 (two) times daily. 60 each 11  . Insulin Pen Needle (BD PEN NEEDLE NANO U/F) 32G X 4 MM MISC USE AS DIRECTED 4 TIMES A DAY 390 each 3  . LANTUS SOLOSTAR 100 UNIT/ML Solostar Pen INJECT 190 UNITS SUBCUTANEOUSLY EVERY MORNING 60 pen 3  . liraglutide (VICTOZA) 18 MG/3ML SOPN Inject 0.3 mLs (1.8 mg total) into the skin daily. 9 mL 11  . montelukast  (SINGULAIR) 10 MG tablet Take 1 tablet (10 mg total) by mouth at bedtime. 30 tablet 3   Current Facility-Administered Medications on File Prior to Visit  Medication Dose Route Frequency Provider Last Rate Last Dose  . 0.9 %  sodium chloride infusion  500 mL Intravenous Continuous Irene Shipper, MD        BP 112/68 (BP Location: Left Arm, Patient Position: Sitting, Cuff Size: Normal)   Pulse 87   Temp 98.5 F (36.9 C) (Oral)   Ht 4\' 10"  (1.473 m)   Wt 183 lb 6.4 oz (83.2 kg)   SpO2 97%   BMI 38.33 kg/m     Review of Systems  Constitutional: Negative.   HENT: Negative for congestion, dental problem, hearing loss, rhinorrhea, sinus pressure, sore throat and tinnitus.   Eyes: Negative for pain, discharge and visual disturbance.  Respiratory: Negative for cough and shortness of breath.   Cardiovascular: Negative for chest pain, palpitations and leg swelling.  Gastrointestinal: Negative for abdominal distention, abdominal pain, blood in stool, constipation, diarrhea, nausea and vomiting.  Genitourinary: Negative for difficulty urinating, dysuria, flank pain, frequency, hematuria, pelvic pain, urgency, vaginal bleeding, vaginal discharge and vaginal pain.  Musculoskeletal: Negative for arthralgias, gait problem and joint swelling.  Skin: Negative for rash.  Neurological: Negative for dizziness, syncope, speech difficulty, weakness, numbness and headaches.  Hematological: Negative for adenopathy.  Psychiatric/Behavioral: Negative for agitation, behavioral problems and dysphoric mood. The patient is not nervous/anxious.        Objective:   Physical Exam  Constitutional: She is oriented to person, place, and time. She appears well-developed and well-nourished.  Blood pressure remains well controlled 120 over 66  HENT:  Head: Normocephalic.  Right Ear: External ear normal.  Left Ear: External ear normal.  Mouth/Throat: Oropharynx is clear and moist.  Eyes: Pupils are equal, round,  and reactive to light. Conjunctivae and EOM are normal.  Neck: Normal range of motion. Neck supple. No thyromegaly present.  Cardiovascular: Normal rate, regular rhythm, normal heart sounds and intact distal pulses.   Pulmonary/Chest: Effort normal and breath sounds normal.  Abdominal: Soft. Bowel sounds are normal. She exhibits no mass. There is no tenderness.  Musculoskeletal: Normal range of motion.  Lymphadenopathy:    She has no cervical adenopathy.  Neurological: She is alert and oriented to person, place, and time.  Skin: Skin is warm and dry. No rash noted.  Psychiatric: She has a normal  mood and affect. Her behavior is normal.          Assessment & Plan:   Normotensive History low back pain.  Resolved Diabetes mellitus, improving.  Continue present regimen and follow-up with endocrinology History of asthma, stable Dyslipidemia.  Continue statin therapy  Follow-up 6 months as scheduled  [name]

## 2017-02-04 NOTE — Patient Instructions (Signed)
Limit your sodium (Salt) intake    It is important that you exercise regularly, at least 20 minutes 3 to 4 times per week.  If you develop chest pain or shortness of breath seek  medical attention.  Call or return to clinic prn if these symptoms worsen or fail to improve as anticipated.  

## 2017-02-08 DIAGNOSIS — S338XXA Sprain of other parts of lumbar spine and pelvis, initial encounter: Secondary | ICD-10-CM | POA: Diagnosis not present

## 2017-02-09 ENCOUNTER — Other Ambulatory Visit: Payer: Self-pay | Admitting: Internal Medicine

## 2017-02-17 ENCOUNTER — Encounter: Payer: Self-pay | Admitting: Cardiology

## 2017-02-17 ENCOUNTER — Ambulatory Visit (INDEPENDENT_AMBULATORY_CARE_PROVIDER_SITE_OTHER): Payer: BLUE CROSS/BLUE SHIELD | Admitting: Cardiology

## 2017-02-17 ENCOUNTER — Other Ambulatory Visit: Payer: Self-pay | Admitting: Endocrinology

## 2017-02-17 VITALS — BP 115/69 | HR 91 | Ht <= 58 in | Wt 185.8 lb

## 2017-02-17 DIAGNOSIS — G4733 Obstructive sleep apnea (adult) (pediatric): Secondary | ICD-10-CM

## 2017-02-17 NOTE — Patient Instructions (Signed)

## 2017-02-17 NOTE — Progress Notes (Signed)
Cardiology Office Note:    Date:  02/17/2017   ID:  Renee Lam, DOB 03-10-1961, MRN [id number]  PCP:  Marletta Lor, MD  Cardiologist:  Fransico Him, MD   Referring MD: Marletta Lor, MD   Chief Complaint  Patient presents with  . Sleep Apnea    History of Present Illness:    Renee Lam is a 56 y.o. female with a hx of severe OSA with an AHI of 46.8/hr with oxygen desaturations as low as 72% with loud snoring.  She recently got a new machine and was set on 18cm H2O but did not tolerate this so she was placed on an auto setting.  She is now here for followup and is doing well. She tolerates her CPAP device and feels the auto pressure is much better tolerated.  She is out of school now and is sleeping much better at night.  She uses a nasal mask which she tolerates well and has no problems with.  She continues to feel rested in the am but occasionally has sleepiness during the day, although improved since getting out of school. She does not think that she is snoring an no longer awakens with the choking sensation.  She has no significant mouth or nasal dryness.   Past Medical History:  Diagnosis Date  . Allergy   . Arthritis   . Asthma   . Chicken pox   . Diabetes mellitus   . Finger infection 2008   isolation  . GERD (gastroesophageal reflux disease)   . Heart murmur    benign per patient  . Hyperlipidemia   . Hypertension   . Migraine   . OSA (obstructive sleep apnea) 10/25/2016  . Osteoporosis   . Pericarditis   . Sleep apnea    Patient is scheduled for sleep study tonight  . Viral meningitis     Past Surgical History:  Procedure Laterality Date  . CARDIAC CATHETERIZATION  2012  . CESAREAN SECTION     1997, 2000  . KNEE ARTHROSCOPY Right 2010  . ORIF HUMERUS FRACTURE Right 03/25/2015   Procedure: OPEN REDUCTION INTERNAL FIXATION (ORIF) DISTAL HUMERUS FRACTURE;  Surgeon: Renette Butters, MD;  Location: [facility];   Service: Orthopedics;  Laterality: Right;    Current Medications: Current Meds  Medication Sig  . albuterol (PROAIR HFA) 108 (90 Base) MCG/ACT inhaler Inhale 2 puffs into the lungs as needed.  Marland Kitchen albuterol (PROVENTIL HFA;VENTOLIN HFA) 108 (90 Base) MCG/ACT inhaler Inhale 2 puffs into the lungs every 4 (four) hours as needed. For shortness of breath/wheezing  . atorvastatin (LIPITOR) 80 MG tablet TAKE 1 TABLET AT BEDTIME  . BAYER CONTOUR NEXT TEST test strip USE TO TEST FOUR TIMES A DAY AS INSTRUCTED.  Marland Kitchen CONTOUR NEXT TEST test strip USE TO TEST FOUR TIMES A DAY AS INSTRUCTED.  Marland Kitchen diphenhydrAMINE (BENADRYL) 25 MG tablet Take 25 mg by mouth every 6 (six) hours as needed for allergies.   Marland Kitchen EPINEPHRINE 0.3 mg/0.3 mL IJ SOAJ injection INJECT 0.3 ML INTO THE MUSCLE ONCE  . Fluticasone-Salmeterol (ADVAIR DISKUS) 250-50 MCG/DOSE AEPB Inhale 1 puff into the lungs 2 (two) times daily.  . Insulin Pen Needle (BD PEN NEEDLE NANO U/F) 32G X 4 MM MISC USE AS DIRECTED 4 TIMES A DAY  . LANTUS SOLOSTAR 100 UNIT/ML Solostar Pen INJECT 190 UNITS SUBCUTANEOUSLY EVERY MORNING  . liraglutide (VICTOZA) 18 MG/3ML SOPN Inject 0.3 mLs (1.8 mg total) into the skin daily.  Marland Kitchen  montelukast (SINGULAIR) 10 MG tablet Take 1 tablet (10 mg total) by mouth at bedtime.   Current Facility-Administered Medications for the 02/17/17 encounter (Office Visit) with Sueanne Margarita, MD  Medication  . 0.9 %  sodium chloride infusion     Allergies:   Food; Penicillins; and Zithromax [azithromycin]   Social History   Social History  . Marital status: Divorced    Spouse name: N/A  . Number of children: N/A  . Years of education: N/A   Social History Main Topics  . Smoking status: Never Smoker  . Smokeless tobacco: Never Used  . Alcohol use Yes     Comment: once a year  . Drug use: No  . Sexual activity: Yes    Birth control/ protection: Post-menopausal   Other Topics Concern  . None   Social History Narrative  . None      Family History: The patient's family history includes Arthritis in her father and other; Breast cancer in her cousin and other; Colon polyps in her mother; Depression in her other; Diabetes in her father and mother; Heart disease in her father and other; Heart failure in her father and mother; Hyperlipidemia in her other; Hypertension in her father and other; Ovarian cancer in her other; Peripheral vascular disease in her mother; Prostate cancer in her father and other; Uterine cancer in her other. There is no history of Colon cancer, Stomach cancer, Rectal cancer, or Pancreatic cancer.  ROS:   Please see the history of present illness.     All other systems reviewed and are negative.  EKGs/Labs/Other Studies Reviewed:    The following studies were reviewed today: CPAP donwload  EKG:  EKG is not ordered today.    Recent Labs: 04/08/2016: ALT 22; BUN 8; Creatinine, Ser 0.57; Hemoglobin 13.2; Platelets 390; Potassium 3.3; Sodium 138; TSH 0.775   Recent Lipid Panel    Component Value Date/Time   CHOL 232 (H) 04/08/2016 0730   TRIG 237 (H) 04/08/2016 0730   HDL 53 04/08/2016 0730   CHOLHDL 4.4 04/08/2016 0730   VLDL 47 (H) 04/08/2016 0730   LDLCALC 132 (H) 04/08/2016 0730    Physical Exam:    VS:  BP 115/69   Pulse 91   Ht 4\' 10"  (1.473 m)   Wt 185 lb 12.8 oz (84.3 kg)   BMI 38.83 kg/m     Wt Readings from Last 3 Encounters:  02/17/17 185 lb 12.8 oz (84.3 kg)  02/04/17 183 lb 6.4 oz (83.2 kg)  01/21/17 188 lb 6.4 oz (85.5 kg)     GEN:  Well nourished, well developed in no acute distress HEENT: Normal NECK: No JVD; No carotid bruits LYMPHATICS: No lymphadenopathy CARDIAC: RRR, no murmurs, rubs, gallops RESPIRATORY:  Clear to auscultation without rales, wheezing or rhonchi  ABDOMEN: Soft, non-tender, non-distended MUSCULOSKELETAL:  No edema; No deformity  SKIN: Warm and dry NEUROLOGIC:  Alert and oriented x 3 PSYCHIATRIC:  Normal affect   ASSESSMENT:    1. OSA  (obstructive sleep apnea)   2. Morbid obesity ([place])    PLAN:    In order of problems listed above:  OSA - the patient is tolerating PAP therapy well without any problems. The PAP download was reviewed today and showed an AHI of 1.7/hr on auto CPAP with 80% compliance in using more than 4 hours nightly.  The patient has been using and benefiting from CPAP use and will continue to benefit from therapy.  Morbid obesity - Since stopping  school she is now walking for exercise.  I encouraged her to cut back on carbs and portions.     Medication Adjustments/Labs and Tests Ordered: Current medicines are reviewed at length with the patient today.  Concerns regarding medicines are outlined above.  No orders of the defined types were placed in this encounter.  No orders of the defined types were placed in this encounter.   Signed, Fransico Him, MD  02/17/2017 12:05 PM    [place]

## 2017-02-18 ENCOUNTER — Telehealth: Payer: Self-pay | Admitting: Internal Medicine

## 2017-02-18 NOTE — Telephone Encounter (Signed)
Received a PA request for Epinephrine. PA submitted & is pending. Key: [id number]

## 2017-02-21 NOTE — Telephone Encounter (Signed)
Patient will need to check with different drugstores for availability

## 2017-02-21 NOTE — Telephone Encounter (Signed)
The PA has been denied. The patient's pharmacy is having trouble getting in the Mylan brand of the Epipen, which is the one her insurance covers. The insurance company will not cover a different brand unless she has tried & failed the Mylan brand or the Brand Epipen.

## 2017-02-21 NOTE — Telephone Encounter (Signed)
Please advise 

## 2017-02-22 DIAGNOSIS — S338XXA Sprain of other parts of lumbar spine and pelvis, initial encounter: Secondary | ICD-10-CM | POA: Diagnosis not present

## 2017-02-22 NOTE — Telephone Encounter (Signed)
Made pt aware of PA denial, pt states she will check which pen is covered by her insurance and will call the office back.

## 2017-03-01 ENCOUNTER — Telehealth: Payer: Self-pay

## 2017-03-01 NOTE — Telephone Encounter (Signed)
Called patient & notified her that he disability paperwork was ready to be picked up.

## 2017-03-03 ENCOUNTER — Encounter: Payer: Self-pay | Admitting: Internal Medicine

## 2017-03-04 DIAGNOSIS — G4733 Obstructive sleep apnea (adult) (pediatric): Secondary | ICD-10-CM | POA: Diagnosis not present

## 2017-03-06 DIAGNOSIS — G4733 Obstructive sleep apnea (adult) (pediatric): Secondary | ICD-10-CM | POA: Diagnosis not present

## 2017-03-07 ENCOUNTER — Encounter: Payer: Self-pay | Admitting: Cardiology

## 2017-03-15 DIAGNOSIS — S338XXA Sprain of other parts of lumbar spine and pelvis, initial encounter: Secondary | ICD-10-CM | POA: Diagnosis not present

## 2017-03-18 ENCOUNTER — Telehealth: Payer: Self-pay | Admitting: *Deleted

## 2017-03-18 NOTE — Telephone Encounter (Signed)
-----   Message from Sueanne Margarita, MD sent at 03/15/2017  7:56 AM EDT ----- Good AHI and compliance.  Continue current CPAP settings.

## 2017-03-18 NOTE — Telephone Encounter (Signed)
Informed patient of compliance results and verbalized understanding was indicated. Patient understands her apnea events are in normal range at 1.2. Patient understands her compliance is good and her settings will not change. Patient was grateful for the call and thanked me.

## 2017-03-24 ENCOUNTER — Ambulatory Visit (INDEPENDENT_AMBULATORY_CARE_PROVIDER_SITE_OTHER): Payer: BLUE CROSS/BLUE SHIELD | Admitting: Endocrinology

## 2017-03-24 VITALS — BP 120/74 | HR 85 | Temp 98.7°F | Resp 16 | Wt 185.5 lb

## 2017-03-24 DIAGNOSIS — E1121 Type 2 diabetes mellitus with diabetic nephropathy: Secondary | ICD-10-CM | POA: Diagnosis not present

## 2017-03-24 DIAGNOSIS — Z0000 Encounter for general adult medical examination without abnormal findings: Secondary | ICD-10-CM | POA: Diagnosis not present

## 2017-03-24 DIAGNOSIS — S338XXA Sprain of other parts of lumbar spine and pelvis, initial encounter: Secondary | ICD-10-CM | POA: Diagnosis not present

## 2017-03-24 LAB — URINALYSIS, ROUTINE W REFLEX MICROSCOPIC
Bilirubin Urine: NEGATIVE
Hgb urine dipstick: NEGATIVE
Ketones, ur: NEGATIVE
Leukocytes, UA: NEGATIVE
Nitrite: NEGATIVE
Specific Gravity, Urine: 1.025 (ref 1.000–1.030)
Total Protein, Urine: NEGATIVE
Urine Glucose: NEGATIVE
Urobilinogen, UA: 0.2 (ref 0.0–1.0)
pH: 6 (ref 5.0–8.0)

## 2017-03-24 LAB — SEDIMENTATION RATE: Sed Rate: 41 mm/hr — ABNORMAL HIGH (ref 0–30)

## 2017-03-24 LAB — BASIC METABOLIC PANEL
BUN: 10 mg/dL (ref 6–23)
CO2: 29 mEq/L (ref 19–32)
Calcium: 8.6 mg/dL (ref 8.4–10.5)
Chloride: 104 mEq/L (ref 96–112)
Creatinine, Ser: 0.64 mg/dL (ref 0.40–1.20)
GFR: 101.51 mL/min (ref >60.00)
Glucose, Bld: 140 mg/dL — ABNORMAL HIGH (ref 70–99)
Potassium: 3.7 mEq/L (ref 3.5–5.1)
Sodium: 141 mEq/L (ref 135–145)

## 2017-03-24 LAB — HEPATIC FUNCTION PANEL
ALT: 20 U/L (ref 0–35)
AST: 12 U/L (ref 0–37)
Albumin: 3.9 g/dL (ref 3.5–5.2)
Alkaline Phosphatase: 79 U/L (ref 39–117)
Bilirubin, Direct: 0.2 mg/dL (ref 0.0–0.3)
Total Bilirubin: 0.9 mg/dL (ref 0.2–1.2)
Total Protein: 7.5 g/dL (ref 6.0–8.3)

## 2017-03-24 LAB — LIPID PANEL
Cholesterol: 123 mg/dL (ref 0–200)
HDL: 46.9 mg/dL (ref >39.00)
LDL Cholesterol: 45 mg/dL (ref 0–99)
NonHDL: 75.74
Total CHOL/HDL Ratio: 3
Triglycerides: 152 mg/dL — ABNORMAL HIGH (ref 0.0–149.0)
VLDL: 30.4 mg/dL (ref 0.0–40.0)

## 2017-03-24 LAB — TSH: TSH: 1.02 u[IU]/mL (ref 0.35–4.50)

## 2017-03-24 LAB — MICROALBUMIN / CREATININE URINE RATIO
Creatinine,U: 132.1 mg/dL
Microalb Creat Ratio: 0.8 mg/g (ref 0.0–30.0)
Microalb, Ur: 1.1 mg/dL (ref 0.0–1.9)

## 2017-03-24 LAB — CBC WITH DIFFERENTIAL/PLATELET
Basophils Absolute: 0.1 10*3/uL (ref 0.0–0.1)
Basophils Relative: 1.3 % (ref 0.0–3.0)
Eosinophils Absolute: 0.2 10*3/uL (ref 0.0–0.7)
Eosinophils Relative: 2 % (ref 0.0–5.0)
HCT: 39.4 % (ref 36.0–46.0)
Hemoglobin: 13.2 g/dL (ref 12.0–15.0)
Lymphocytes Relative: 32.3 % (ref 12.0–46.0)
Lymphs Abs: 2.9 10*3/uL (ref 0.7–4.0)
MCHC: 33.4 g/dL (ref 30.0–36.0)
MCV: 85.3 fl (ref 78.0–100.0)
Monocytes Absolute: 0.6 10*3/uL (ref 0.1–1.0)
Monocytes Relative: 6.2 % (ref 3.0–12.0)
Neutro Abs: 5.2 10*3/uL (ref 1.4–7.7)
Neutrophils Relative %: 58.2 % (ref 43.0–77.0)
Platelets: 435 10*3/uL — ABNORMAL HIGH (ref 150.0–400.0)
RBC: 4.62 Mil/uL (ref 3.87–5.11)
RDW: 13.8 % (ref 11.5–15.5)
WBC: 8.9 10*3/uL (ref 4.0–10.5)

## 2017-03-24 LAB — POCT GLYCOSYLATED HEMOGLOBIN (HGB A1C): Hemoglobin A1C: 7.1

## 2017-03-24 NOTE — Patient Instructions (Addendum)
check your blood sugar twice a day.  vary the time of day when you check, between before the 3 meals, and at bedtime.  also check if you have symptoms of your blood sugar being too high or too low.  please keep a record of the readings and bring it to your next appointment here.  You can write it on any piece of paper.  please call us sooner if your blood sugar goes below 70, or if you have a lot of readings over 200.  Please continue the same medications for diabetes.     Please come back for a follow-up appointment in 4 months.

## 2017-03-24 NOTE — Progress Notes (Signed)
Subjective:    Patient ID: Renee Lam, female    DOB: 10/03/1960, 56 y.o.   MRN: [id number]  HPI Pt returns for f/u of diabetes mellitus: DM type: Insulin-requiring type 2 Dx'ed: 9518 Complications: none Therapy: insulin since 2010, and victoza GDM: 1996 and 1999.  DKA: never.  Severe hypoglycemia: never.     Pancreatitis: never.  Other: she takes QD insulin, after poor results with multiple daily injections.   Interval history: She says she never missed her meds.  no cbg record, but states cbg's vary from 42-166.  She does not recall any trend throughout the day.  pt states she feels well in general.   Past Medical History:  Diagnosis Date  . Allergy   . Arthritis   . Asthma   . Chicken pox   . Diabetes mellitus   . Finger infection 2008   isolation  . GERD (gastroesophageal reflux disease)   . Heart murmur    benign per patient  . Hyperlipidemia   . Hypertension   . Migraine   . OSA (obstructive sleep apnea) 10/25/2016  . Osteoporosis   . Pericarditis   . Sleep apnea    Patient is scheduled for sleep study tonight  . Viral meningitis     Past Surgical History:  Procedure Laterality Date  . CARDIAC CATHETERIZATION  2012  . CESAREAN SECTION     1997, 2000  . KNEE ARTHROSCOPY Right 2010  . ORIF HUMERUS FRACTURE Right 03/25/2015   Procedure: OPEN REDUCTION INTERNAL FIXATION (ORIF) DISTAL HUMERUS FRACTURE;  Surgeon: Renette Butters, MD;  Location: [place];  Service: Orthopedics;  Laterality: Right;    Social History   Social History  . Marital status: Divorced    Spouse name: N/A  . Number of children: N/A  . Years of education: N/A   Occupational History  . Not on file.   Social History Main Topics  . Smoking status: Never Smoker  . Smokeless tobacco: Never Used  . Alcohol use Yes     Comment: once a year  . Drug use: No  . Sexual activity: Yes    Birth control/ protection: Post-menopausal   Other Topics Concern  . Not  on file   Social History Narrative  . No narrative on file    Current Outpatient Prescriptions on File Prior to Visit  Medication Sig Dispense Refill  . albuterol (PROAIR HFA) 108 (90 Base) MCG/ACT inhaler Inhale 2 puffs into the lungs as needed.    Marland Kitchen albuterol (PROVENTIL HFA;VENTOLIN HFA) 108 (90 Base) MCG/ACT inhaler Inhale 2 puffs into the lungs every 4 (four) hours as needed. For shortness of breath/wheezing 1 Inhaler 12  . atorvastatin (LIPITOR) 80 MG tablet TAKE 1 TABLET AT BEDTIME 90 tablet 2  . BAYER CONTOUR NEXT TEST test strip USE TO TEST FOUR TIMES A DAY AS INSTRUCTED.  12  . CONTOUR NEXT TEST test strip USE TO TEST FOUR TIMES A DAY AS INSTRUCTED. 100 each 10  . diphenhydrAMINE (BENADRYL) 25 MG tablet Take 25 mg by mouth every 6 (six) hours as needed for allergies.     Marland Kitchen EPINEPHRINE 0.3 mg/0.3 mL IJ SOAJ injection INJECT 0.3 ML INTO THE MUSCLE ONCE 2 mL 0  . Fluticasone-Salmeterol (ADVAIR DISKUS) 250-50 MCG/DOSE AEPB Inhale 1 puff into the lungs 2 (two) times daily. 60 each 11  . Insulin Pen Needle (BD PEN NEEDLE NANO U/F) 32G X 4 MM MISC USE AS DIRECTED 4 TIMES A DAY  390 each 3  . LANTUS SOLOSTAR 100 UNIT/ML Solostar Pen INJECT 190 UNITS SUBCUTANEOUSLY EVERY MORNING 60 mL 3  . liraglutide (VICTOZA) 18 MG/3ML SOPN Inject 0.3 mLs (1.8 mg total) into the skin daily. 9 mL 11  . montelukast (SINGULAIR) 10 MG tablet Take 1 tablet (10 mg total) by mouth at bedtime. 30 tablet 3   Current Facility-Administered Medications on File Prior to Visit  Medication Dose Route Frequency Provider Last Rate Last Dose  . 0.9 %  sodium chloride infusion  500 mL Intravenous Continuous Irene Shipper, MD          Family History  Problem Relation Age of Onset  . Colon polyps Mother   . Diabetes Mother   . Heart failure Mother   . Peripheral vascular disease Mother   . Diabetes Father   . Heart disease Father   . Arthritis Father   . Hypertension Father   . Heart failure Father   . Prostate  cancer Father   . Arthritis Other   . Hyperlipidemia Other   . Heart disease Other   . Hypertension Other   . Depression Other   . Ovarian cancer Other   . Uterine cancer Other   . Breast cancer Other   . Prostate cancer Other   . Breast cancer Cousin   . Colon cancer Neg Hx   . Stomach cancer Neg Hx   . Rectal cancer Neg Hx   . Pancreatic cancer Neg Hx     BP 120/74 (BP Location: Left Arm, Patient Position: Sitting, Cuff Size: Normal)   Pulse 85   Temp 98.7 F (37.1 C) (Oral)   Resp 16   Wt 185 lb 8 oz (84.1 kg)   SpO2 96%   BMI 38.77 kg/m    Review of Systems She denies hypoglycemia.     Objective:   Physical Exam VITAL SIGNS:  See vs page GENERAL: no distress Pulses: foot pulses are intact bilaterally.   MSK: no deformity of the feet or ankles.  CV: no edema of the legs or ankles Skin:  no ulcer on the feet or ankles.  normal color and temp on the feet and ankles Neuro: sensation is intact to touch on the feet and ankles.     Lab Results  Component Value Date   HGBA1C 7.1 03/24/2017      Assessment & Plan:  Insulin-requiring type 2 DM: this is the best control this pt should aim for, given this regimen, which does match insulin to her changing needs throughout the day. Dyslipidemia, new to me: recheck today.    Patient Instructions  check your blood sugar twice a day.  vary the time of day when you check, between before the 3 meals, and at bedtime.  also check if you have symptoms of your blood sugar being too high or too low.  please keep a record of the readings and bring it to your next appointment here.  You can write it on any piece of paper.  please call us sooner if your blood sugar goes below 70, or if you have a lot of readings over 200.  Please continue the same medications for diabetes.     Please come back for a follow-up appointment in 4 months.

## 2017-04-05 DIAGNOSIS — G4733 Obstructive sleep apnea (adult) (pediatric): Secondary | ICD-10-CM | POA: Diagnosis not present

## 2017-04-13 ENCOUNTER — Encounter: Payer: Self-pay | Admitting: Internal Medicine

## 2017-04-13 ENCOUNTER — Ambulatory Visit (INDEPENDENT_AMBULATORY_CARE_PROVIDER_SITE_OTHER): Payer: BLUE CROSS/BLUE SHIELD | Admitting: Internal Medicine

## 2017-04-13 VITALS — BP 110/58 | HR 80 | Temp 98.2°F | Ht <= 58 in | Wt 184.6 lb

## 2017-04-13 DIAGNOSIS — M722 Plantar fascial fibromatosis: Secondary | ICD-10-CM | POA: Diagnosis not present

## 2017-04-13 DIAGNOSIS — E1121 Type 2 diabetes mellitus with diabetic nephropathy: Secondary | ICD-10-CM

## 2017-04-13 DIAGNOSIS — M25572 Pain in left ankle and joints of left foot: Secondary | ICD-10-CM | POA: Diagnosis not present

## 2017-04-13 DIAGNOSIS — M25571 Pain in right ankle and joints of right foot: Secondary | ICD-10-CM | POA: Diagnosis not present

## 2017-04-13 DIAGNOSIS — M25561 Pain in right knee: Secondary | ICD-10-CM | POA: Diagnosis not present

## 2017-04-13 DIAGNOSIS — M1711 Unilateral primary osteoarthritis, right knee: Secondary | ICD-10-CM | POA: Diagnosis not present

## 2017-04-13 MED ORDER — NAPROXEN 500 MG PO TABS: 500.0000 mg | ORAL_TABLET | Freq: Two times a day (BID) | ORAL | 3 refills | Status: DC

## 2017-04-13 NOTE — Progress Notes (Signed)
Subjective:    Patient ID: Renee Lam, female    DOB: 11-12-1960, 56 y.o.   MRN: [id number]  HPI  Lab Results  Component Value Date   HGBA1C 7.1 03/24/2017   56 year old patient who presents with a chief complaint of right knee pain.  Hip discomfort.  Bilateral heel and ankle discomfort.  She works as a Corporate treasurer and has recently started a job where she has been much more physically active. Incidentally, she does have orthopedic follow-up scheduled later today with Dr. Percell Miller  Past Medical History:  Diagnosis Date  . Allergy   . Arthritis   . Asthma   . Chicken pox   . Diabetes mellitus   . Finger infection 2008   isolation  . GERD (gastroesophageal reflux disease)   . Heart murmur    benign per patient  . Hyperlipidemia   . Hypertension   . Migraine   . OSA (obstructive sleep apnea) 10/25/2016  . Osteoporosis   . Pericarditis   . Sleep apnea    Patient is scheduled for sleep study tonight  . Viral meningitis      Social History   Social History  . Marital status: Divorced    Spouse name: N/A  . Number of children: N/A  . Years of education: N/A   Occupational History  . Not on file.   Social History Main Topics  . Smoking status: Never Smoker  . Smokeless tobacco: Never Used  . Alcohol use Yes     Comment: once a year  . Drug use: No  . Sexual activity: Yes    Birth control/ protection: Post-menopausal   Other Topics Concern  . Not on file   Social History Narrative  . No narrative on file    Past Surgical History:  Procedure Laterality Date  . CARDIAC CATHETERIZATION  2012  . CESAREAN SECTION     1997, 2000  . KNEE ARTHROSCOPY Right 2010  . ORIF HUMERUS FRACTURE Right 03/25/2015   Procedure: OPEN REDUCTION INTERNAL FIXATION (ORIF) DISTAL HUMERUS FRACTURE;  Surgeon: Renette Butters, MD;  Location: [place];  Service: Orthopedics;  Laterality: Right;    Family History  Problem Relation Age of Onset  . Colon polyps  Mother   . Diabetes Mother   . Heart failure Mother   . Peripheral vascular disease Mother   . Diabetes Father   . Heart disease Father   . Arthritis Father   . Hypertension Father   . Heart failure Father   . Prostate cancer Father   . Arthritis Other   . Hyperlipidemia Other   . Heart disease Other   . Hypertension Other   . Depression Other   . Ovarian cancer Other   . Uterine cancer Other   . Breast cancer Other   . Prostate cancer Other   . Breast cancer Cousin   . Colon cancer Neg Hx   . Stomach cancer Neg Hx   . Rectal cancer Neg Hx   . Pancreatic cancer Neg Hx       Current Outpatient Prescriptions on File Prior to Visit  Medication Sig Dispense Refill  . albuterol (PROAIR HFA) 108 (90 Base) MCG/ACT inhaler Inhale 2 puffs into the lungs as needed.    Marland Kitchen albuterol (PROVENTIL HFA;VENTOLIN HFA) 108 (90 Base) MCG/ACT inhaler Inhale 2 puffs into the lungs every 4 (four) hours as needed. For shortness of breath/wheezing 1 Inhaler 12  . atorvastatin (LIPITOR) 80 MG tablet TAKE  1 TABLET AT BEDTIME 90 tablet 2  . BAYER CONTOUR NEXT TEST test strip USE TO TEST FOUR TIMES A DAY AS INSTRUCTED.  12  . CONTOUR NEXT TEST test strip USE TO TEST FOUR TIMES A DAY AS INSTRUCTED. 100 each 10  . diphenhydrAMINE (BENADRYL) 25 MG tablet Take 25 mg by mouth every 6 (six) hours as needed for allergies.     Marland Kitchen EPINEPHRINE 0.3 mg/0.3 mL IJ SOAJ injection INJECT 0.3 ML INTO THE MUSCLE ONCE 2 mL 0  . Fluticasone-Salmeterol (ADVAIR DISKUS) 250-50 MCG/DOSE AEPB Inhale 1 puff into the lungs 2 (two) times daily. 60 each 11  . Insulin Pen Needle (BD PEN NEEDLE NANO U/F) 32G X 4 MM MISC USE AS DIRECTED 4 TIMES A DAY 390 each 3  . LANTUS SOLOSTAR 100 UNIT/ML Solostar Pen INJECT 190 UNITS SUBCUTANEOUSLY EVERY MORNING 60 mL 3  . liraglutide (VICTOZA) 18 MG/3ML SOPN Inject 0.3 mLs (1.8 mg total) into the skin daily. 9 mL 11  . montelukast (SINGULAIR) 10 MG tablet Take 1 tablet (10 mg total) by mouth at  bedtime. 30 tablet 3   Current Facility-Administered Medications on File Prior to Visit  Medication Dose Route Frequency Provider Last Rate Last Dose  . 0.9 %  sodium chloride infusion  500 mL Intravenous Continuous Irene Shipper, MD        BP (!) 110/58 (BP Location: Left Arm, Patient Position: Sitting, Cuff Size: Normal)   Pulse 80   Temp 98.2 F (36.8 C) (Oral)   Ht 4\' 10"  (1.473 m)   Wt 184 lb 9.6 oz (83.7 kg)   SpO2 98%   BMI 38.58 kg/m     Review of Systems  Constitutional: Negative.   HENT: Negative for congestion, dental problem, hearing loss, rhinorrhea, sinus pressure, sore throat and tinnitus.   Eyes: Negative for pain, discharge and visual disturbance.  Respiratory: Negative for cough and shortness of breath.   Cardiovascular: Negative for chest pain, palpitations and leg swelling.  Gastrointestinal: Negative for abdominal distention, abdominal pain, blood in stool, constipation, diarrhea, nausea and vomiting.  Genitourinary: Negative for difficulty urinating, dysuria, flank pain, frequency, hematuria, pelvic pain, urgency, vaginal bleeding, vaginal discharge and vaginal pain.  Musculoskeletal: Positive for arthralgias. Negative for gait problem and joint swelling.  Skin: Negative for rash.  Neurological: Negative for dizziness, syncope, speech difficulty, weakness, numbness and headaches.  Hematological: Negative for adenopathy.  Psychiatric/Behavioral: Negative for agitation, behavioral problems and dysphoric mood. The patient is not nervous/anxious.        Objective:   Physical Exam  Constitutional: She appears well-developed and well-nourished. No distress.  Musculoskeletal:  Full range of motion both hips Right knee no pain or effusion No local tenderness to lateral or medial knee. No tenderness over the patellar ligament          Assessment & Plan:   Right knee pain.  History of mild degenerative changes.  Probable overuse syndrome.  Will try  naproxen twice daily.  Follow-up orthopedics.  Proper foot care.  Encouraged Hip, ankle and heel pain  [name]

## 2017-04-13 NOTE — Patient Instructions (Signed)
Proper foot care as discussed  Orthopedic follow-up as scheduled  Call or return to clinic prn if these symptoms worsen or fail to improve as anticipated.  Naproxen one twice daily

## 2017-04-18 DIAGNOSIS — S338XXA Sprain of other parts of lumbar spine and pelvis, initial encounter: Secondary | ICD-10-CM | POA: Diagnosis not present

## 2017-04-26 DIAGNOSIS — G4733 Obstructive sleep apnea (adult) (pediatric): Secondary | ICD-10-CM | POA: Diagnosis not present

## 2017-05-06 DIAGNOSIS — G4733 Obstructive sleep apnea (adult) (pediatric): Secondary | ICD-10-CM | POA: Diagnosis not present

## 2017-05-13 DIAGNOSIS — H35361 Drusen (degenerative) of macula, right eye: Secondary | ICD-10-CM | POA: Diagnosis not present

## 2017-05-13 DIAGNOSIS — E119 Type 2 diabetes mellitus without complications: Secondary | ICD-10-CM | POA: Diagnosis not present

## 2017-05-13 DIAGNOSIS — H2513 Age-related nuclear cataract, bilateral: Secondary | ICD-10-CM | POA: Diagnosis not present

## 2017-05-13 DIAGNOSIS — H35033 Hypertensive retinopathy, bilateral: Secondary | ICD-10-CM | POA: Diagnosis not present

## 2017-05-13 LAB — HM DIABETES EYE EXAM

## 2017-05-19 ENCOUNTER — Encounter: Payer: Self-pay | Admitting: Internal Medicine

## 2017-05-26 DIAGNOSIS — G4733 Obstructive sleep apnea (adult) (pediatric): Secondary | ICD-10-CM | POA: Diagnosis not present

## 2017-06-05 ENCOUNTER — Emergency Department (HOSPITAL_COMMUNITY)
Admission: EM | Admit: 2017-06-05 | Discharge: 2017-06-05 | Disposition: A | Discharge status: Home / Self Care | Payer: BLUE CROSS/BLUE SHIELD | Attending: Emergency Medicine | Admitting: Emergency Medicine

## 2017-06-05 ENCOUNTER — Encounter (HOSPITAL_COMMUNITY): Payer: Self-pay | Admitting: *Deleted

## 2017-06-05 ENCOUNTER — Other Ambulatory Visit: Payer: Self-pay

## 2017-06-05 DIAGNOSIS — M5441 Lumbago with sciatica, right side: Secondary | ICD-10-CM | POA: Insufficient documentation

## 2017-06-05 DIAGNOSIS — S39012A Strain of muscle, fascia and tendon of lower back, initial encounter: Secondary | ICD-10-CM | POA: Insufficient documentation

## 2017-06-05 DIAGNOSIS — J45909 Unspecified asthma, uncomplicated: Secondary | ICD-10-CM | POA: Insufficient documentation

## 2017-06-05 DIAGNOSIS — M5431 Sciatica, right side: Secondary | ICD-10-CM

## 2017-06-05 DIAGNOSIS — Y9289 Other specified places as the place of occurrence of the external cause: Secondary | ICD-10-CM | POA: Insufficient documentation

## 2017-06-05 DIAGNOSIS — E785 Hyperlipidemia, unspecified: Secondary | ICD-10-CM | POA: Insufficient documentation

## 2017-06-05 DIAGNOSIS — E119 Type 2 diabetes mellitus without complications: Secondary | ICD-10-CM | POA: Diagnosis not present

## 2017-06-05 DIAGNOSIS — I10 Essential (primary) hypertension: Secondary | ICD-10-CM | POA: Insufficient documentation

## 2017-06-05 DIAGNOSIS — Y9389 Activity, other specified: Secondary | ICD-10-CM | POA: Insufficient documentation

## 2017-06-05 DIAGNOSIS — S3992XA Unspecified injury of lower back, initial encounter: Secondary | ICD-10-CM | POA: Diagnosis not present

## 2017-06-05 DIAGNOSIS — Z119 Encounter for screening for infectious and parasitic diseases, unspecified: Secondary | ICD-10-CM | POA: Insufficient documentation

## 2017-06-05 DIAGNOSIS — X500XXA Overexertion from strenuous movement or load, initial encounter: Secondary | ICD-10-CM | POA: Insufficient documentation

## 2017-06-05 DIAGNOSIS — Y990 Civilian activity done for income or pay: Secondary | ICD-10-CM | POA: Diagnosis not present

## 2017-06-05 DIAGNOSIS — G4733 Obstructive sleep apnea (adult) (pediatric): Secondary | ICD-10-CM | POA: Diagnosis not present

## 2017-06-05 MED ORDER — NAPROXEN 500 MG PO TABS: 500.0000 mg | ORAL_TABLET | Freq: Two times a day (BID) | ORAL | 0 refills | Status: DC

## 2017-06-05 MED ORDER — LIDOCAINE 5 % EX PTCH: 1.0000 | MEDICATED_PATCH | CUTANEOUS | 0 refills | Status: DC

## 2017-06-05 MED ORDER — METHOCARBAMOL 500 MG PO TABS: 500.0000 mg | ORAL_TABLET | Freq: Two times a day (BID) | ORAL | 0 refills | Status: DC

## 2017-06-05 NOTE — ED Provider Notes (Signed)
[facility] EMERGENCY DEPARTMENT Provider Note   CSN: [id number] Arrival date & time: 06/05/17  1705     History   Chief Complaint Chief Complaint  Patient presents with  . Hip Pain    HPI Renee Lam is a 56 y.o. female with a past medical history of hypertension, hyperlipidemia, diabetes, arthritis, who presents to ED for evaluation of 2-day history of right lower back pain with sharp, shooting pain radiating down right leg.  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  She took 1 dose of muscle relaxer with no improvement in her symptoms.  She reports previous history of similar symptoms in the past that have resolved on their own.  She reports pain worse with walking and movement.  She denies any previous back surgeries, numbness in legs, loss of bowel or bladder function, fever, history of cancer, history of IV drug use, additional injuries or falls, prior kidney issues or urinary symptoms.  HPI  Past Medical History:  Diagnosis Date  . Allergy   . Arthritis   . Asthma   . Chicken pox   . Diabetes mellitus   . Finger infection 2008   isolation  . GERD (gastroesophageal reflux disease)   . Heart murmur    benign per patient  . Hyperlipidemia   . Hypertension   . Migraine   . OSA (obstructive sleep apnea) 10/25/2016  . Osteoporosis   . Pericarditis   . Sleep apnea    Patient is scheduled for sleep study tonight  . Viral meningitis     Patient Active Problem List   Diagnosis Date Noted  . Right knee pain 04/13/2017  . Wellness examination 03/24/2017  . OSA (obstructive sleep apnea) 10/25/2016  . Morbid obesity ([place]) 03/04/2016  . Diabetes mellitus type 2, controlled ([place]) 10/01/2015  . Diabetic retinopathy ([place]) 07/24/2015  . Fracture, humerus 03/25/2015  . Acute bronchitis with asthma 05/31/2013  . Asthma 06/18/2011  . Hypercholesterolemia 06/18/2011    Past Surgical History:    Procedure Laterality Date  . CARDIAC CATHETERIZATION  2012  . CESAREAN SECTION     1997, 2000  . KNEE ARTHROSCOPY Right 2010  . ORIF HUMERUS FRACTURE Right 03/25/2015   Procedure: OPEN REDUCTION INTERNAL FIXATION (ORIF) DISTAL HUMERUS FRACTURE;  Surgeon: Renette Butters, MD;  Location: [facility];  Service: Orthopedics;  Laterality: Right;    OB History    No data available       Home Medications    Prior to Admission medications   Medication Sig Start Date End Date Taking? Authorizing Provider  albuterol (PROAIR HFA) 108 (90 Base) MCG/ACT inhaler Inhale 2 puffs into the lungs as needed.    [provider]  albuterol (PROVENTIL HFA;VENTOLIN HFA) 108 (90 Base) MCG/ACT inhaler Inhale 2 puffs into the lungs every 4 (four) hours as needed. For shortness of breath/wheezing 06/01/16 09/08/17  Howard Pouch A, DO  atorvastatin (LIPITOR) 80 MG tablet TAKE 1 TABLET AT BEDTIME 12/23/16   Marletta Lor, MD  BAYER CONTOUR NEXT TEST test strip USE TO TEST FOUR TIMES A DAY AS INSTRUCTED. 04/01/16   [provider]  CONTOUR NEXT TEST test strip USE TO TEST FOUR TIMES A DAY AS INSTRUCTED. 01/13/17   Marletta Lor, MD  diphenhydrAMINE (BENADRYL) 25 MG tablet Take 25 mg by mouth every 6 (six) hours as needed for allergies.     [provider]  EPINEPHRINE 0.3 mg/0.3 mL IJ SOAJ injection INJECT 0.3 ML INTO THE MUSCLE ONCE 02/10/17   Marletta Lor, MD  Fluticasone-Salmeterol (ADVAIR DISKUS) 250-50 MCG/DOSE AEPB Inhale 1 puff into the lungs 2 (two) times daily. 06/01/16   Kuneff, Renee A, DO  Insulin Pen Needle (BD PEN NEEDLE NANO U/F) 32G X 4 MM MISC USE AS DIRECTED 4 TIMES A DAY 11/05/16   Renato Shin, MD  LANTUS SOLOSTAR 100 UNIT/ML Solostar Pen INJECT 190 UNITS SUBCUTANEOUSLY EVERY MORNING 02/17/17   Renato Shin, MD  lidocaine (LIDODERM) 5 % Place 1 patch onto the skin daily. Remove & Discard patch within 12 hours or as directed by MD 06/05/17    Delia Heady, PA-C  liraglutide (VICTOZA) 18 MG/3ML SOPN Inject 0.3 mLs (1.8 mg total) into the skin daily. 10/19/16   Renato Shin, MD  methocarbamol (ROBAXIN) 500 MG tablet Take 1 tablet (500 mg total) by mouth 2 (two) times daily. 06/05/17   Zan Orlick, PA-C  montelukast (SINGULAIR) 10 MG tablet Take 1 tablet (10 mg total) by mouth at bedtime. 06/01/16   Kuneff, Renee A, DO  naproxen (NAPROSYN) 500 MG tablet Take 1 tablet (500 mg total) by mouth 2 (two) times daily. 06/05/17   Delia Heady, PA-C    Family History Family History  Problem Relation Age of Onset  . Colon polyps Mother   . Diabetes Mother   . Heart failure Mother   . Peripheral vascular disease Mother   . Diabetes Father   . Heart disease Father   . Arthritis Father   . Hypertension Father   . Heart failure Father   . Prostate cancer Father   . Arthritis Other   . Hyperlipidemia Other   . Heart disease Other   . Hypertension Other   . Depression Other   . Ovarian cancer Other   . Uterine cancer Other   . Breast cancer Other   . Prostate cancer Other   . Breast cancer Cousin   . Colon cancer Neg Hx   . Stomach cancer Neg Hx   . Rectal cancer Neg Hx   . Pancreatic cancer Neg Hx     Social History Social History   Tobacco Use  . Smoking status: Never Smoker  . Smokeless tobacco: Never Used  Substance Use Topics  . Alcohol use: Yes    Comment: once a year  . Drug use: No     Allergies   Food; Penicillins; and Zithromax [azithromycin]   Review of Systems Review of Systems  Constitutional: Negative for chills and fever.  Gastrointestinal: Negative for nausea and vomiting.  Genitourinary: Negative for dysuria, flank pain and urgency.  Musculoskeletal: Positive for back pain, gait problem and myalgias. Negative for arthralgias, joint swelling, neck pain and neck stiffness.  Skin: Negative for rash.  Neurological: Negative for weakness and numbness.     Physical Exam Updated Vital Signs BP  (!) 142/76 (BP Location: Left Arm)   Pulse 85   Temp 98.9 F (37.2 C) (Oral)   Resp 14   Ht 4\' 10"  (1.473 m)   Wt 83.9 kg (185 lb)   SpO2 99%   BMI 38.67 kg/m   Physical Exam  Constitutional: She appears well-developed and well-nourished. No distress.  Nontoxic appearing and in no acute distress.  HENT:  Head: Normocephalic and atraumatic.  Eyes: Conjunctivae and EOM are normal. No scleral icterus.  Neck: Normal range of motion.  Pulmonary/Chest: Effort normal. No respiratory distress.  Abdominal:  No CVA  tenderness noted.  Musculoskeletal: Normal range of motion. She exhibits tenderness. She exhibits no edema or deformity.       Arms: Tenderness to palpation of the indicated area of the paraspinal musculature on the right side at the lumbar level. No midline spinal tenderness present in lumbar, thoracic or cervical spine. No step-off palpated. No visible bruising, edema or temperature change noted. No objective signs of numbness present. No saddle anesthesia. 2+ DP pulses bilaterally. Sensation intact to light touch. Strength 5/5 in bilateral lower extremities.  Neurological: She is alert.  Skin: No rash noted. She is not diaphoretic.  Psychiatric: She has a normal mood and affect.  Nursing note and vitals reviewed.    ED Treatments / Results  Labs (all labs ordered are listed, but only abnormal results are displayed) Labs Reviewed - No data to display  EKG  EKG Interpretation None       Radiology No results found.  Procedures Procedures (including critical care time)  Medications Ordered in ED Medications - No data to display   Initial Impression / Assessment and Plan / ED Course  I have reviewed the triage vital signs and the nursing notes.  Pertinent labs & imaging results that were available during my care of the patient were reviewed by me and considered in my medical decision making (see chart for details).     Patient presents to ED for evaluation  of 2-day history of right-sided lower back pain with sharp, shooting pain radiating down her right leg.  Symptoms began after lifting a heavy patient at work 2 days ago.  She has tried 1 dose of muscle relaxer with no improvement in her symptoms.  She has no red flags for back pain including no prior back surgeries, history of cancer, history of IV drug use, fevers, urinary or bowel incontinence.  On physical exam she is overall well-appearing.  She has no focal deficits on her neurological exam.  She has no midline spinal tenderness but did have some tenderness of the paraspinal musculature on the right side.  She is afebrile with no history of fever.  I suspect that her symptoms are due to sciatica rather than cauda equina or other acute spinal cord injury or infectious process.  Will give patient symptomatic treatment with muscle relaxer, anti-inflammatories and lidocaine patches as well as instructions on heat therapy and stretching.  Advised to follow-up with her primary care provider for further evaluation.  Patient appears stable for discharge at this time.  Strict return precautions given.  Final Clinical Impressions(s) / ED Diagnoses   Final diagnoses:  Sciatica of right side  Strain of lumbar region, initial encounter    ED Discharge Orders        Ordered    methocarbamol (ROBAXIN) 500 MG tablet  2 times daily     06/05/17 1837    naproxen (NAPROSYN) 500 MG tablet  2 times daily     06/05/17 1837    lidocaine (LIDODERM) 5 %  Every 24 hours     06/05/17 1837     Portions of this note were generated with [employer]. Dictation errors may occur despite best attempts at proofreading.    Delia Heady, PA-C 06/05/17 1842    Duffy Bruce, MD 06/06/17 1341

## 2017-06-05 NOTE — ED Notes (Signed)
Declined W/C at D/C and was escorted to lobby by RN. 

## 2017-06-05 NOTE — Discharge Instructions (Signed)
Please read attached information regarding your condition. Use lidocaine patches as directed. Take Robaxin and naproxen as needed for pain and inflammation. Apply heating pad and stretch area as tolerated. Follow-up with your primary care provider for further evaluation. Return to ED for worsening pain, injuries or falls, numbness in legs, loss of bowel or bladder function.

## 2017-06-05 NOTE — ED Triage Notes (Signed)
The pt is c/o rt hip pain lower back and thigh  She was lifting a parient 2 days ago whenever she hurt his back

## 2017-06-09 ENCOUNTER — Ambulatory Visit: Payer: BLUE CROSS/BLUE SHIELD | Admitting: Adult Health

## 2017-06-10 ENCOUNTER — Other Ambulatory Visit: Payer: Self-pay | Admitting: Adult Health

## 2017-06-13 ENCOUNTER — Encounter: Payer: Self-pay | Admitting: Family Medicine

## 2017-06-13 ENCOUNTER — Ambulatory Visit: Payer: BLUE CROSS/BLUE SHIELD | Admitting: Family Medicine

## 2017-06-13 VITALS — BP 120/80 | HR 88 | Temp 98.3°F | Wt 183.6 lb

## 2017-06-13 DIAGNOSIS — M5431 Sciatica, right side: Secondary | ICD-10-CM

## 2017-06-13 HISTORY — DX: Sciatica, right side: M54.31

## 2017-06-13 MED ORDER — METHOCARBAMOL 500 MG PO TABS: 500.0000 mg | ORAL_TABLET | Freq: Four times a day (QID) | ORAL | 0 refills | Status: DC | PRN

## 2017-06-13 NOTE — Progress Notes (Signed)
   Subjective:    Patient ID: Renee Lam, female    DOB: 04-19-1961, 56 y.o.   MRN: [id number]  HPI Here to follow up an ER visit on 06-05-17 for the sudden onset of pain in the right lower back that radiated down the right leg after lifting a heavy patient at work. She was given Robaxin and Naprosyn, and she is using heat. She feels better although there is still a little pain at times. Still no numbness or weakness. She actually went back to work on 06-10-17, but she is avoiding any heavy lifting.    Review of Systems  Constitutional: Negative.   Respiratory: Negative.   Cardiovascular: Negative.   Musculoskeletal: Positive for back pain.       Objective:   Physical Exam  Constitutional: She is oriented to person, place, and time. She appears well-developed and well-nourished.  Cardiovascular: Normal rate, regular rhythm, normal heart sounds and intact distal pulses.  Pulmonary/Chest: Effort normal and breath sounds normal. No respiratory distress. She has no wheezes. She has no rales.  Musculoskeletal:  Mildly tender in the right lower back and over the right sciatic notch. Full ROM and negative SLR.   Neurological: She is alert and oriented to person, place, and time.          Assessment & Plan:  Low back strain, improving slowly. She will continue to use medications as needed. Recheck prn.  Alysia Penna, MD

## 2017-06-16 ENCOUNTER — Encounter (INDEPENDENT_AMBULATORY_CARE_PROVIDER_SITE_OTHER): Payer: BLUE CROSS/BLUE SHIELD | Admitting: Ophthalmology

## 2017-06-16 DIAGNOSIS — H4601 Optic papillitis, right eye: Secondary | ICD-10-CM

## 2017-06-16 DIAGNOSIS — H43813 Vitreous degeneration, bilateral: Secondary | ICD-10-CM | POA: Diagnosis not present

## 2017-06-16 DIAGNOSIS — H2513 Age-related nuclear cataract, bilateral: Secondary | ICD-10-CM

## 2017-06-17 DIAGNOSIS — H5340 Unspecified visual field defects: Secondary | ICD-10-CM | POA: Diagnosis not present

## 2017-06-17 DIAGNOSIS — H43812 Vitreous degeneration, left eye: Secondary | ICD-10-CM | POA: Diagnosis not present

## 2017-06-17 DIAGNOSIS — H25813 Combined forms of age-related cataract, bilateral: Secondary | ICD-10-CM | POA: Diagnosis not present

## 2017-06-17 DIAGNOSIS — Z79899 Other long term (current) drug therapy: Secondary | ICD-10-CM | POA: Diagnosis not present

## 2017-06-17 DIAGNOSIS — H4710 Unspecified papilledema: Secondary | ICD-10-CM | POA: Diagnosis not present

## 2017-06-20 ENCOUNTER — Ambulatory Visit: Payer: BLUE CROSS/BLUE SHIELD | Admitting: Family Medicine

## 2017-06-21 DIAGNOSIS — H4710 Unspecified papilledema: Secondary | ICD-10-CM | POA: Diagnosis not present

## 2017-06-21 DIAGNOSIS — H539 Unspecified visual disturbance: Secondary | ICD-10-CM | POA: Diagnosis not present

## 2017-06-21 DIAGNOSIS — H5340 Unspecified visual field defects: Secondary | ICD-10-CM | POA: Diagnosis not present

## 2017-06-21 DIAGNOSIS — H0520 Unspecified exophthalmos: Secondary | ICD-10-CM | POA: Diagnosis not present

## 2017-06-22 ENCOUNTER — Other Ambulatory Visit: Payer: Self-pay | Admitting: Endocrinology

## 2017-06-23 ENCOUNTER — Encounter: Payer: Self-pay | Admitting: Internal Medicine

## 2017-06-23 ENCOUNTER — Ambulatory Visit: Payer: BLUE CROSS/BLUE SHIELD | Admitting: Internal Medicine

## 2017-06-23 VITALS — BP 120/82 | Temp 98.3°F | Wt 181.0 lb

## 2017-06-23 DIAGNOSIS — J4520 Mild intermittent asthma, uncomplicated: Secondary | ICD-10-CM

## 2017-06-23 DIAGNOSIS — H47011 Ischemic optic neuropathy, right eye: Secondary | ICD-10-CM | POA: Diagnosis not present

## 2017-06-23 DIAGNOSIS — E7800 Pure hypercholesterolemia, unspecified: Secondary | ICD-10-CM | POA: Diagnosis not present

## 2017-06-23 MED ORDER — ASPIRIN EC 325 MG PO TBEC: 325.0000 mg | DELAYED_RELEASE_TABLET | Freq: Every day | ORAL | 0 refills | Status: DC

## 2017-06-23 NOTE — Progress Notes (Signed)
Subjective:    Patient ID: Renee Lam, female    DOB: [birth year], 57 y.o.   MRN: [id number]  HPI  57 year old patient who is followed by endocrinology with diabetes complicated by retinopathy.  She is seen here today for routine follow-up.  She was seen last month for right-sided sciatica that has resolved. She was seen last week by ophthalmology due to visual loss in the right eye.  She was referred to [facility] neuro ophthalmology and has been evaluated for suspected ischemic optic neuropathy.  Further details are unknown.  Evaluation did include an MRI on January 7.  She is scheduled for follow-up tomorrow to discuss results.  She was also screened for GCA apparently with normal findings.  Steroid therapy was discussed but deferred due to her diabetes and history of uncontrolled hyperglycemia on steroids in the past.  She continues to have diminished visual acuity on the right which has not changed.  Review of systems-patient states that over the past month she has had to use pads for urinary leakage and that she has had 2 episodes of fecal incontinence  Past Medical History:  Diagnosis Date  . Allergy   . Arthritis   . Asthma   . Chicken pox   . Diabetes mellitus   . Finger infection 2008   isolation  . GERD (gastroesophageal reflux disease)   . Heart murmur    benign per patient  . Hyperlipidemia   . Hypertension   . Migraine   . OSA (obstructive sleep apnea) 10/25/2016  . Osteoporosis   . Pericarditis   . Right sided sciatica 06/13/2017  . Sleep apnea    Patient is scheduled for sleep study tonight  . Viral meningitis        Past Surgical History:  Procedure Laterality Date  . CARDIAC CATHETERIZATION  2012  . CESAREAN SECTION     1997, 2000  . KNEE ARTHROSCOPY Right 2010  . ORIF HUMERUS FRACTURE Right 03/25/2015   Procedure: OPEN REDUCTION INTERNAL FIXATION (ORIF) DISTAL HUMERUS FRACTURE;  Surgeon: Renette Butters, MD;  Location: [facility];  Service: Orthopedics;  Laterality: Right;    Family History  Problem Relation Age of Onset  . Colon polyps Mother   . Diabetes Mother   . Heart failure Mother   . Peripheral vascular disease Mother   . Diabetes Father   . Heart disease Father   . Arthritis Father   . Hypertension Father   . Heart failure Father   . Prostate cancer Father   . Arthritis Other   . Hyperlipidemia Other   . Heart disease Other   . Hypertension Other   . Depression Other   . Ovarian cancer Other   . Uterine cancer Other   . Breast cancer Other   . Prostate cancer Other   . Breast cancer Cousin   . Colon cancer Neg Hx   . Stomach cancer Neg Hx   . Rectal cancer Neg Hx   . Pancreatic cancer Neg Hx         BP 120/82 (BP Location: Left Arm)   Temp 98.3 F (36.8 C) (Oral)   Wt 181 lb (82.1 kg)   BMI 37.83 kg/m     Review of Systems  Constitutional: Negative.   HENT: Negative for congestion, dental problem, hearing loss, rhinorrhea, sinus pressure, sore throat and tinnitus.   Eyes: Positive for visual disturbance. Negative for pain and discharge.  Respiratory: Negative for cough and shortness  of breath.   Cardiovascular: Negative for chest pain, palpitations and leg swelling.  Gastrointestinal: Negative for abdominal distention, abdominal pain, blood in stool, constipation, diarrhea, nausea and vomiting.  Genitourinary: Negative for difficulty urinating, dysuria, flank pain, frequency, hematuria, pelvic pain, urgency, vaginal bleeding, vaginal discharge and vaginal pain.  Musculoskeletal: Positive for back pain. Negative for arthralgias, gait problem and joint swelling.  Skin: Negative for rash.  Neurological: Negative for dizziness, syncope, speech difficulty, weakness, numbness and headaches.  Hematological: Negative for adenopathy.  Psychiatric/Behavioral: Negative for agitation, behavioral problems and dysphoric mood. The patient is not nervous/anxious.        Objective:     Physical Exam  Constitutional: She is oriented to person, place, and time. She appears well-developed and well-nourished.  HENT:  Head: Normocephalic.  Right Ear: External ear normal.  Left Ear: External ear normal.  Mouth/Throat: Oropharynx is clear and moist.  Eyes: Conjunctivae and EOM are normal. Pupils are equal, round, and reactive to light.  Neck: Normal range of motion. Neck supple. No thyromegaly present.  Cardiovascular: Normal rate, regular rhythm, normal heart sounds and intact distal pulses.  Pulmonary/Chest: Effort normal and breath sounds normal.  Abdominal: Soft. Bowel sounds are normal. She exhibits no mass. There is no tenderness.  Musculoskeletal: Normal range of motion.  Lymphadenopathy:    She has no cervical adenopathy.  Neurological: She is alert and oriented to person, place, and time.  Skin: Skin is warm and dry. No rash noted.  Psychiatric: She has a normal mood and affect. Her behavior is normal.          Assessment & Plan:   History of sciatica.  Resolved History of right ischemic optic neuropathy.   ASA 325 mg daily recommended.  It was also recommended that patient avoid all NSAID in the future.  Follow-up neurology.  Patient is scheduled for follow-up tomorrow to discuss MRI results and further evaluation Diabetes mellitus.  Follow-up Endocrinology Dyslipidemia continue statin therapy.  Follow-up.  3 months or as needed   [name]

## 2017-06-23 NOTE — Patient Instructions (Signed)
Aspirin 325 mg daily  Avoid all anti-inflammatory medications such as naproxen Aleve Advil ibuprofen  Ophthalmology follow-up as scheduled  Endocrinology follow-up as scheduled

## 2017-06-24 DIAGNOSIS — H5340 Unspecified visual field defects: Secondary | ICD-10-CM | POA: Diagnosis not present

## 2017-06-24 DIAGNOSIS — H4710 Unspecified papilledema: Secondary | ICD-10-CM | POA: Diagnosis not present

## 2017-06-24 DIAGNOSIS — Z794 Long term (current) use of insulin: Secondary | ICD-10-CM | POA: Diagnosis not present

## 2017-06-24 DIAGNOSIS — E1136 Type 2 diabetes mellitus with diabetic cataract: Secondary | ICD-10-CM | POA: Diagnosis not present

## 2017-06-24 DIAGNOSIS — Z79899 Other long term (current) drug therapy: Secondary | ICD-10-CM | POA: Diagnosis not present

## 2017-06-24 DIAGNOSIS — H43812 Vitreous degeneration, left eye: Secondary | ICD-10-CM | POA: Diagnosis not present

## 2017-06-24 DIAGNOSIS — H25813 Combined forms of age-related cataract, bilateral: Secondary | ICD-10-CM | POA: Diagnosis not present

## 2017-07-01 DIAGNOSIS — M1711 Unilateral primary osteoarthritis, right knee: Secondary | ICD-10-CM | POA: Diagnosis not present

## 2017-07-06 DIAGNOSIS — G4733 Obstructive sleep apnea (adult) (pediatric): Secondary | ICD-10-CM | POA: Diagnosis not present

## 2017-07-11 ENCOUNTER — Encounter: Payer: Self-pay | Admitting: Endocrinology

## 2017-07-11 ENCOUNTER — Ambulatory Visit: Payer: BLUE CROSS/BLUE SHIELD | Admitting: Endocrinology

## 2017-07-11 VITALS — BP 145/76 | HR 87 | Wt 183.6 lb

## 2017-07-11 DIAGNOSIS — E1121 Type 2 diabetes mellitus with diabetic nephropathy: Secondary | ICD-10-CM

## 2017-07-11 LAB — POCT GLYCOSYLATED HEMOGLOBIN (HGB A1C): Hemoglobin A1C: 6.9

## 2017-07-11 MED ORDER — INSULIN GLARGINE 100 UNIT/ML SOLOSTAR PEN: 180.0000 [IU] | PEN_INJECTOR | SUBCUTANEOUS | 3 refills | Status: DC

## 2017-07-11 NOTE — Patient Instructions (Addendum)
check your blood sugar twice a day.  vary the time of day when you check, between before the 3 meals, and at bedtime.  also check if you have symptoms of your blood sugar being too high or too low.  please keep a record of the readings and bring it to your next appointment here.  You can write it on any piece of paper.  please call us sooner if your blood sugar goes below 70, or if you have a lot of readings over 200.  Please reduce the insulin to 180 units each morning.  If you start steroids, carefully check your blood sugar, and call if it goes over 200.     Please come back for a follow-up appointment in 4 months.

## 2017-07-11 NOTE — Progress Notes (Signed)
Subjective:    Patient ID: Renee Lam, female    DOB: 13-Dec-1960, 57 y.o.   MRN: [id number]  HPI Pt returns for f/u of diabetes mellitus: DM type: Insulin-requiring type 2 Dx'ed: 3614 Complications: none Therapy: insulin since 2010, and victoza.  GDM: 1996 and 1999.  DKA: never.  Severe hypoglycemia: never.     Pancreatitis: never.  Other: she takes QD insulin, after poor results with multiple daily injections.   Interval history: she brings a record of her cbg's which I have reviewed today.  She still has mild hypoglycemia, when a meal is misses or delayed.  pt states she feels well in general.  She may need to start steroids soon for the optic neuritis.  Past Medical History:  Diagnosis Date  . Allergy   . Arthritis   . Asthma   . Chicken pox   . Diabetes mellitus   . Finger infection 2008   isolation  . GERD (gastroesophageal reflux disease)   . Heart murmur    benign per patient  . Hyperlipidemia   . Hypertension   . Migraine   . OSA (obstructive sleep apnea) 10/25/2016  . Osteoporosis   . Pericarditis   . Right sided sciatica 06/13/2017  . Sleep apnea    Patient is scheduled for sleep study tonight  . Viral meningitis     Past Surgical History:  Procedure Laterality Date  . CARDIAC CATHETERIZATION  2012  . CESAREAN SECTION     1997, 2000  . KNEE ARTHROSCOPY Right 2010  . ORIF HUMERUS FRACTURE Right 03/25/2015   Procedure: OPEN REDUCTION INTERNAL FIXATION (ORIF) DISTAL HUMERUS FRACTURE;  Surgeon: Renette Butters, MD;  Location: [facility];  Service: Orthopedics;  Laterality: Right;    Social History   Socioeconomic History  . Marital status: Divorced    Spouse name: Not on file  . Number of children: Not on file  . Years of education: Not on file  . Highest education level: Not on file  Social Needs  . Financial resource strain: Not on file  . Food insecurity - worry: Not on file  . Food insecurity - inability: Not on file  .  Transportation needs - medical: Not on file  . Transportation needs - non-medical: Not on file  Occupational History  . Not on file  Tobacco Use  . Smoking status: Never Smoker  . Smokeless tobacco: Never Used  Substance and Sexual Activity  . Alcohol use: Yes    Comment: once a year  . Drug use: No  . Sexual activity: Yes    Birth control/protection: Post-menopausal  Other Topics Concern  . Not on file  Social History Narrative  . Not on file    Current Outpatient Medications on File Prior to Visit  Medication Sig Dispense Refill  . albuterol (PROAIR HFA) 108 (90 Base) MCG/ACT inhaler Inhale 2 puffs into the lungs as needed.    Marland Kitchen aspirin EC 325 MG tablet Take 1 tablet (325 mg total) by mouth daily. 30 tablet 0  . atorvastatin (LIPITOR) 80 MG tablet TAKE 1 TABLET AT BEDTIME 90 tablet 2  . BAYER CONTOUR NEXT TEST test strip USE TO TEST FOUR TIMES A DAY AS INSTRUCTED.  12  . brimonidine (ALPHAGAN) 0.2 % ophthalmic solution Place 1 drop into the right eye 3 times daily.    . CONTOUR NEXT TEST test strip USE TO TEST FOUR TIMES A DAY AS INSTRUCTED. 100 each 10  . diphenhydrAMINE (BENADRYL)  25 MG tablet Take 25 mg by mouth every 6 (six) hours as needed for allergies.     Marland Kitchen EPINEPHRINE 0.3 mg/0.3 mL IJ SOAJ injection INJECT 0.3 ML INTO THE MUSCLE ONCE 2 mL 0  . Fluticasone-Salmeterol (ADVAIR DISKUS) 250-50 MCG/DOSE AEPB Inhale 1 puff into the lungs 2 (two) times daily. 60 each 11  . Insulin Pen Needle (BD PEN NEEDLE NANO U/F) 32G X 4 MM MISC USE AS DIRECTED 4 TIMES A DAY 390 each 3  . liraglutide (VICTOZA) 18 MG/3ML SOPN Inject 0.3 mLs (1.8 mg total) into the skin daily. 9 mL 11  . methocarbamol (ROBAXIN) 500 MG tablet Take 1 tablet (500 mg total) by mouth every 6 (six) hours as needed for muscle spasms. 60 tablet 0  . montelukast (SINGULAIR) 10 MG tablet Take 1 tablet (10 mg total) by mouth at bedtime. 30 tablet 3  . naproxen (NAPROSYN) 500 MG tablet Take 1 tablet (500 mg total) by mouth  2 (two) times daily. 30 tablet 0   Current Facility-Administered Medications on File Prior to Visit  Medication Dose Route Frequency Provider Last Rate Last Dose  . 0.9 %  sodium chloride infusion  500 mL Intravenous Continuous Irene Shipper, MD          Family History  Problem Relation Age of Onset  . Colon polyps Mother   . Diabetes Mother   . Heart failure Mother   . Peripheral vascular disease Mother   . Diabetes Father   . Heart disease Father   . Arthritis Father   . Hypertension Father   . Heart failure Father   . Prostate cancer Father   . Arthritis Other   . Hyperlipidemia Other   . Heart disease Other   . Hypertension Other   . Depression Other   . Ovarian cancer Other   . Uterine cancer Other   . Breast cancer Other   . Prostate cancer Other   . Breast cancer Cousin   . Colon cancer Neg Hx   . Stomach cancer Neg Hx   . Rectal cancer Neg Hx   . Pancreatic cancer Neg Hx     BP (!) 145/76 (BP Location: Left Arm, Patient Position: Sitting, Cuff Size: Normal)   Pulse 87   Wt 183 lb 9.6 oz (83.3 kg)   SpO2 96%   BMI 38.37 kg/m   Review of Systems Denies LOC    Objective:   Physical Exam VITAL SIGNS:  See vs page GENERAL: no distress Pulses: dorsalis pedis intact bilat.   MSK: no deformity of the feet CV: no leg edema Skin:  no ulcer on the feet.  normal color and temp on the feet. Neuro: sensation is intact to touch on the feet.     Lab Results  Component Value Date   HGBA1C 6.9 07/11/2017      Assessment & Plan:  Insulin-requiring type 2 DM: overcontrolled, given this regimen, which does match insulin to her changing needs throughout the day. Optic neuritis, new to me.  We discussed the fact that steroids will increase cbg's   Patient Instructions  check your blood sugar twice a day.  vary the time of day when you check, between before the 3 meals, and at bedtime.  also check if you have symptoms of your blood sugar being too high or too low.   please keep a record of the readings and bring it to your next appointment here.  You can write it on any  piece of paper.  please call us sooner if your blood sugar goes below 70, or if you have a lot of readings over 200.  Please reduce the insulin to 180 units each morning.  If you start steroids, carefully check your blood sugar, and call if it goes over 200.     Please come back for a follow-up appointment in 4 months.

## 2017-07-20 ENCOUNTER — Telehealth: Payer: Self-pay | Admitting: Family Medicine

## 2017-07-20 NOTE — Telephone Encounter (Signed)
Please advise sir.

## 2017-07-20 NOTE — Telephone Encounter (Signed)
Copied from [place] [id number]. Topic: General - Other >> Jul 20, 2017 10:19 AM Yvette Rack wrote: Reason for CRM: patient calling to see if there is anything she can take over the counter for back pain she has not went to a pharmacy to ask what was a good medicine for that
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
## 2017-07-25 ENCOUNTER — Other Ambulatory Visit: Payer: Self-pay | Admitting: Family Medicine

## 2017-07-25 ENCOUNTER — Ambulatory Visit: Payer: BLUE CROSS/BLUE SHIELD | Admitting: Endocrinology

## 2017-07-26 ENCOUNTER — Other Ambulatory Visit: Payer: Self-pay | Admitting: Internal Medicine

## 2017-07-26 MED ORDER — NAPROXEN 500 MG PO TABS: 500.0000 mg | ORAL_TABLET | Freq: Two times a day (BID) | ORAL | 1 refills | Status: DC

## 2017-07-26 MED ORDER — METHOCARBAMOL 500 MG PO TABS: 500.0000 mg | ORAL_TABLET | Freq: Four times a day (QID) | ORAL | 0 refills | Status: DC | PRN

## 2017-07-26 NOTE — Telephone Encounter (Signed)
Patient is on both naproxen and Robaxin.  Continue for exacerbation of low back pain

## 2017-07-26 NOTE — Telephone Encounter (Signed)
What prescription is patient requesting a refill on??

## 2017-07-26 NOTE — Telephone Encounter (Signed)
Pt was advised to continue to take  naproxen and Robaxin for exacerbation of low back pain. Pt verbalized understating. Pt requested refills of both mediations, Rx refills e-scribed to the pharmacy.

## 2017-07-26 NOTE — Telephone Encounter (Signed)
Last OV 06/13/2017  Rx was last refilled 06/13/2017 disp 60 with no refills   Sent to Dr. Sarajane Jews

## 2017-07-26 NOTE — Telephone Encounter (Signed)
Send to Dr. Burnice Logan

## 2017-07-27 NOTE — Telephone Encounter (Signed)
What medication

## 2017-07-29 DIAGNOSIS — Z794 Long term (current) use of insulin: Secondary | ICD-10-CM | POA: Diagnosis not present

## 2017-07-29 DIAGNOSIS — H25813 Combined forms of age-related cataract, bilateral: Secondary | ICD-10-CM | POA: Diagnosis not present

## 2017-07-29 DIAGNOSIS — H43813 Vitreous degeneration, bilateral: Secondary | ICD-10-CM | POA: Diagnosis not present

## 2017-07-29 DIAGNOSIS — H53431 Sector or arcuate defects, right eye: Secondary | ICD-10-CM | POA: Diagnosis not present

## 2017-07-29 DIAGNOSIS — H4710 Unspecified papilledema: Secondary | ICD-10-CM | POA: Diagnosis not present

## 2017-07-29 DIAGNOSIS — Z7982 Long term (current) use of aspirin: Secondary | ICD-10-CM | POA: Diagnosis not present

## 2017-07-29 DIAGNOSIS — H53421 Scotoma of blind spot area, right eye: Secondary | ICD-10-CM | POA: Diagnosis not present

## 2017-07-29 DIAGNOSIS — H5340 Unspecified visual field defects: Secondary | ICD-10-CM | POA: Diagnosis not present

## 2017-08-02 ENCOUNTER — Ambulatory Visit: Payer: Self-pay | Admitting: *Deleted

## 2017-08-02 NOTE — Telephone Encounter (Signed)
Patient is concerned about sinus and dizziness and nausea- she is not having any other symptoms at this time. 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 Patient states she usually ends up with sinus infection when she gets these symptoms. Patient has not tried any OTC remedies and has only had symptoms for 2 days. She states her current medical problems are diabetes and hyper lipidemia. She states her glucose levels are in good control. Offered home care vs appointment- and she does want to try some OTC treatment but is requesting a refill of her Singular if possible. She states she will call back if her symptoms persist over 10 days for an appointment.  Reason for Disposition . [1] Sinus congestion as part of a cold AND [2] present < 10 days  Answer Assessment - Initial Assessment Questions 1. LOCATION: "Where does it hurt?"      No pain 2. ONSET: "When did the sinus pain start?"  (e.g., hours, days)      Just congestion in sinus- causing dizziness and nausea in the morning only 3. SEVERITY: "How bad is the pain?"   (Scale 1-10; mild, moderate or severe)   - MILD (1-3): doesn't interfere with normal activities    - MODERATE (4-7): interferes with normal activities (e.g., work or school) or awakens from sleep   - SEVERE (8-10): excruciating pain and patient unable to do any normal activities        n/a 4. RECURRENT SYMPTOM: "Have you ever had sinus problems before?" If so, ask: "When was the last time?" and "What happened that time?"      Yes- both allergies and history of sinus infection, patient does take medication during allergy season, patient is not using any OTC 5. NASAL CONGESTION: "Is the nose blocked?" If so, ask, "Can you open it or must you breathe through the mouth?"     No- patient is breathing through mouth on/off 6. NASAL DISCHARGE: "Do you have discharge from your nose?" If so ask, "What color?"     Nasal discharge- clear 7. FEVER: "Do you have a  fever?" If so, ask: "What is it, how was it measured, and when did it start?"      No fever 8. OTHER SYMPTOMS: "Do you have any other symptoms?" (e.g., sore throat, cough, earache, difficulty breathing)     Dizziness when she gets up from laying down in the morning 9. PREGNANCY: "Is there any chance you are pregnant?" "When was your last menstrual period?"     n/a  Protocols used: SINUS PAIN OR CONGESTION-A-AH

## 2017-08-02 NOTE — Telephone Encounter (Signed)
Called patient back to verify that her dizziness is only associated with her congestion- patient states only associated with congestion and not associated with any other health problem. Told patient if it occurs at any other time to call for appointment.

## 2017-08-04 ENCOUNTER — Encounter (INDEPENDENT_AMBULATORY_CARE_PROVIDER_SITE_OTHER): Payer: BLUE CROSS/BLUE SHIELD

## 2017-08-05 ENCOUNTER — Other Ambulatory Visit: Payer: Self-pay | Admitting: Internal Medicine

## 2017-08-06 DIAGNOSIS — G4733 Obstructive sleep apnea (adult) (pediatric): Secondary | ICD-10-CM | POA: Diagnosis not present

## 2017-08-10 ENCOUNTER — Encounter: Payer: Self-pay | Admitting: Internal Medicine

## 2017-08-10 ENCOUNTER — Other Ambulatory Visit: Payer: Self-pay | Admitting: Internal Medicine

## 2017-08-15 ENCOUNTER — Ambulatory Visit (INDEPENDENT_AMBULATORY_CARE_PROVIDER_SITE_OTHER): Payer: BLUE CROSS/BLUE SHIELD | Admitting: Family Medicine

## 2017-08-16 ENCOUNTER — Ambulatory Visit (INDEPENDENT_AMBULATORY_CARE_PROVIDER_SITE_OTHER): Payer: BLUE CROSS/BLUE SHIELD | Admitting: Family Medicine

## 2017-08-23 ENCOUNTER — Ambulatory Visit (INDEPENDENT_AMBULATORY_CARE_PROVIDER_SITE_OTHER): Payer: BLUE CROSS/BLUE SHIELD | Admitting: Family Medicine

## 2017-08-23 ENCOUNTER — Encounter (INDEPENDENT_AMBULATORY_CARE_PROVIDER_SITE_OTHER): Payer: Self-pay

## 2017-08-28 ENCOUNTER — Other Ambulatory Visit: Payer: Self-pay | Admitting: Endocrinology

## 2017-08-29 ENCOUNTER — Ambulatory Visit: Payer: BLUE CROSS/BLUE SHIELD | Admitting: Family Medicine

## 2017-08-29 ENCOUNTER — Encounter: Payer: Self-pay | Admitting: Family Medicine

## 2017-08-29 ENCOUNTER — Telehealth: Payer: Self-pay | Admitting: Family Medicine

## 2017-08-29 VITALS — BP 116/70 | HR 91 | Temp 98.8°F | Wt 182.2 lb

## 2017-08-29 DIAGNOSIS — R6889 Other general symptoms and signs: Secondary | ICD-10-CM

## 2017-08-29 DIAGNOSIS — J209 Acute bronchitis, unspecified: Secondary | ICD-10-CM

## 2017-08-29 DIAGNOSIS — J029 Acute pharyngitis, unspecified: Secondary | ICD-10-CM

## 2017-08-29 LAB — POCT RAPID STREP A (OFFICE): Rapid Strep A Screen: NEGATIVE

## 2017-08-29 LAB — POC INFLUENZA A&B (BINAX/QUICKVUE)
Influenza A, POC: NEGATIVE
Influenza B, POC: NEGATIVE

## 2017-08-29 MED ORDER — ALBUTEROL SULFATE HFA 108 (90 BASE) MCG/ACT IN AERS: 2.0000 | INHALATION_SPRAY | RESPIRATORY_TRACT | 3 refills | Status: DC | PRN

## 2017-08-29 MED ORDER — AZITHROMYCIN 250 MG PO TABS: ORAL_TABLET | ORAL | 0 refills | Status: DC

## 2017-08-29 NOTE — Telephone Encounter (Signed)
Called pt and left a VM that Rx has been sent into pharmacy.

## 2017-08-29 NOTE — Progress Notes (Signed)
   Subjective:    Patient ID: Renee Lam, female    DOB: 06-17-1960, 57 y.o.   MRN: [id number]  HPI Here for 3 days of fever to 101.8 degrees, headache, ST, chest tightness, coughing up yellow sputum, vomiting and diarrhea. Drinking fluids and taking Tylenol.    Review of Systems  Constitutional: Positive for fever.  HENT: Positive for congestion, postnasal drip and sore throat. Negative for sinus pressure and sinus pain.   Eyes: Negative.   Respiratory: Positive for cough and chest tightness. Negative for wheezing.        Objective:   Physical Exam  Constitutional: She appears well-developed and well-nourished.  HENT:  Right Ear: External ear normal.  Left Ear: External ear normal.  Nose: Nose normal.  Mouth/Throat: Oropharynx is clear and moist.  Eyes: Conjunctivae are normal.  Neck: No thyromegaly present.  Pulmonary/Chest: Effort normal. She has no wheezes. She has no rales.  Scattered rhonchi   Lymphadenopathy:    She has no cervical adenopathy.          Assessment & Plan:  Bronchitis, treat with a Zpack. Add Delsym prn. Written out of work today and tomorrow.  Alysia Penna, MD

## 2017-08-29 NOTE — Telephone Encounter (Signed)
Copied from [place]. Topic: General - Other >> Aug 29, 2017  3:46 PM [name] wrote:  Pt said Dr Sarajane Jews told her to use her inhaler and she has had the below rx in a while it was discontinued   ALBUTEROL [place]

## 2017-08-29 NOTE — Telephone Encounter (Signed)
Called pt and left a VM that Rx

## 2017-08-29 NOTE — Telephone Encounter (Signed)
Has been sent into pharmacy.

## 2017-08-31 ENCOUNTER — Encounter: Payer: Self-pay | Admitting: Family Medicine

## 2017-08-31 ENCOUNTER — Ambulatory Visit: Payer: BLUE CROSS/BLUE SHIELD | Admitting: Family Medicine

## 2017-08-31 ENCOUNTER — Telehealth: Payer: Self-pay | Admitting: Internal Medicine

## 2017-08-31 VITALS — BP 138/82 | HR 92 | Temp 98.5°F | Wt 178.4 lb

## 2017-08-31 DIAGNOSIS — G43809 Other migraine, not intractable, without status migrainosus: Secondary | ICD-10-CM

## 2017-08-31 DIAGNOSIS — J209 Acute bronchitis, unspecified: Secondary | ICD-10-CM

## 2017-08-31 MED ORDER — LEVOFLOXACIN 500 MG PO TABS: 500.0000 mg | ORAL_TABLET | Freq: Every day | ORAL | 0 refills | Status: AC

## 2017-08-31 MED ORDER — TRAMADOL HCL 50 MG PO TABS: 100.0000 mg | ORAL_TABLET | Freq: Four times a day (QID) | ORAL | 0 refills | Status: DC | PRN

## 2017-08-31 MED ORDER — ONDANSETRON HCL 8 MG PO TABS: 8.0000 mg | ORAL_TABLET | Freq: Three times a day (TID) | ORAL | 1 refills | Status: DC | PRN

## 2017-08-31 NOTE — Telephone Encounter (Signed)
Copied from [place]. Topic: Quick Communication - Rx Refill/Question >> Aug 31, 2017  4:14 PM Oliver Pila B wrote: Medication:  levofloxacin (LEVAQUIN) 500 MG tablet [[id number]] ,  Fluticasone-Salmeterol (ADVAIR DISKUS) 250-50 MCG/DOSE AEPB [[id number]]  Pharmacist called and states that these medications cannot be mixed together, contact pharmacy if needed

## 2017-08-31 NOTE — Progress Notes (Signed)
   Subjective:    Patient ID: Renee Lam, female    DOB: Mar 10, 1961, 57 y.o.   MRN: [id number]  HPI Here for ongoing URI symptoms. She was seen here on 08-29-17 for chest congestion, cough, and fever and was felt to have a bronchitis. She was given a Zpack, and she has taken 3 doses of this. Her chest congestion has improved a bit but now she has a headache. For the past 24 hours she has had a steady headache in the back of the head which causes nausea, but she has not vomited. Bright lights make her ead hurt worse. No blurred vision or other neurologic deficits. Drinking fluids. She has alternated Tylenol and Advil but this has not helped. She used to have migraine headaches when she was younger.    Review of Systems  Constitutional: Negative.   HENT: Positive for congestion and postnasal drip. Negative for sinus pressure, sinus pain and sore throat.   Eyes: Positive for photophobia. Negative for visual disturbance.  Respiratory: Positive for cough and chest tightness. Negative for shortness of breath and wheezing.   Cardiovascular: Negative.   Gastrointestinal: Negative.   Neurological: Positive for headaches.       Objective:   Physical Exam  Constitutional: She is oriented to person, place, and time.  She appears ill   HENT:  Head: Normocephalic and atraumatic.  Right Ear: External ear normal.  Left Ear: External ear normal.  Nose: Nose normal.  Mouth/Throat: Oropharynx is clear and moist.  Eyes: Conjunctivae and EOM are normal. Pupils are equal, round, and reactive to light.  Neck: Normal range of motion. Neck supple. No thyromegaly present.  Cardiovascular: Normal rate, regular rhythm, normal heart sounds and intact distal pulses.  Pulmonary/Chest: Effort normal and breath sounds normal. No respiratory distress. She has no wheezes. She has no rales.  Lymphadenopathy:    She has no cervical adenopathy.  Neurological: She is alert and oriented to person, place, and time.  No cranial nerve deficit. Coordination normal.          Assessment & Plan:  She has a partially treated bronchitis, and I think this has triggered a migraine. She will stop the Zpack and switch to Levaquin for 10 days. Use Zofran prn nausea. Use Tramadol prn headache. Written out of work from 08-29-17 until 09-05-17.  Alysia Penna, MD

## 2017-09-01 NOTE — Telephone Encounter (Signed)
See pharmacy concern. Thanks.

## 2017-09-01 NOTE — Telephone Encounter (Signed)
Noted! Routing to Dr.Kwiatkowski. 

## 2017-09-01 NOTE — Telephone Encounter (Signed)
This medication was prescribed by Dr. Sarajane Jews, although I am unaware of the interaction of Levaquin and Advair

## 2017-09-02 NOTE — Telephone Encounter (Signed)
Tell her this potential interaction is very rare and I would like her to take these medications so she can get better

## 2017-09-02 NOTE — Telephone Encounter (Signed)
Called and spoke with pt. Pt advised and voiced understanding.  

## 2017-09-03 DIAGNOSIS — G4733 Obstructive sleep apnea (adult) (pediatric): Secondary | ICD-10-CM | POA: Diagnosis not present

## 2017-09-08 ENCOUNTER — Telehealth: Payer: Self-pay | Admitting: *Deleted

## 2017-09-08 NOTE — Telephone Encounter (Signed)
Prior auth for Tramadol 50mg  sent to [email or web address].

## 2017-09-08 NOTE — Telephone Encounter (Signed)
Copied from [place] [id number]. Topic: General - Other >> Sep 08, 2017 11:56 AM Yvette Rack wrote: Reason for CRM: Larkin Ina from [facility] [phone] calling about a prior authorization on Tramadol  I left a message for the pt to return my call. Sherryle Lis

## 2017-09-09 NOTE — Telephone Encounter (Signed)
Renee Lam with [employer] states they are missing the correct form for the PA for the Tramadol. Requesting callback. CB#: [phone]  Ref#: [id number]

## 2017-09-12 ENCOUNTER — Telehealth: Payer: Self-pay | Admitting: Endocrinology

## 2017-09-12 DIAGNOSIS — M25562 Pain in left knee: Secondary | ICD-10-CM | POA: Diagnosis not present

## 2017-09-12 NOTE — Telephone Encounter (Signed)
Patient went to see orthopedic Doctor and they gave patient a Cortizone shot, She would like to know if Dr Loanne Drilling has any instructions for her regarding her blood sugar since she has received this shot    Please advise

## 2017-09-12 NOTE — Telephone Encounter (Signed)
I left a message for Sam to return my call.

## 2017-09-13 ENCOUNTER — Ambulatory Visit (INDEPENDENT_AMBULATORY_CARE_PROVIDER_SITE_OTHER): Payer: BLUE CROSS/BLUE SHIELD | Admitting: Ophthalmology

## 2017-09-13 ENCOUNTER — Telehealth: Payer: Self-pay | Admitting: Internal Medicine

## 2017-09-13 NOTE — Telephone Encounter (Signed)
Sam with [employer] called back - please return call at [phone]

## 2017-09-13 NOTE — Telephone Encounter (Signed)
See prior note

## 2017-09-13 NOTE — Telephone Encounter (Signed)
I called [employer] at the number below and spoke with [facility].  I informed Renee Lam of the following per the pts chart: patient has not been on an opioid in the past 180 days, no other medications were tried, and the quantity was written for 8 tabs a day-not greater than 8.  Renee Lam stated she will forward this information to their pharmacist for review.

## 2017-09-13 NOTE — Telephone Encounter (Signed)
Copied from [place] [id number]. Topic: Quick Communication - See Telephone Encounter >> Sep 13, 2017  1:54 PM Vernona Rieger wrote: CRM for notification. See Telephone encounter for: 09/13/17.  [employer] needs a PA on the traMADol (ULTRAM) 50 MG tablet for the additional info. They have reached out 4 times. It is due today.  Call back please @ [phone] ext [phone] or fax back the form they sent

## 2017-09-13 NOTE — Telephone Encounter (Signed)
If your blood sugar is in the 200's, take an extra 20 units that morning.   If is is over 300, take 40 extra.  The effect of the steroid shot on the blood sugar will be gone in a few days

## 2017-09-14 ENCOUNTER — Telehealth: Payer: Self-pay | Admitting: Endocrinology

## 2017-09-14 ENCOUNTER — Other Ambulatory Visit: Payer: Self-pay | Admitting: Internal Medicine

## 2017-09-14 NOTE — Telephone Encounter (Signed)
Patient is still waiting for return call from 09/12/17. She needs instructions on what to do re: A1C has raised since cortisone injection. Please call asap ph# [phone]

## 2017-09-15 NOTE — Telephone Encounter (Signed)
I have called and advised patient per Dr. Loanne Drilling.

## 2017-09-15 NOTE — Telephone Encounter (Signed)
I called patient & gave her what doses to take if blood sugars are elevated. Patient showed understanding.

## 2017-09-21 ENCOUNTER — Ambulatory Visit: Payer: BLUE CROSS/BLUE SHIELD | Admitting: Internal Medicine

## 2017-09-21 DIAGNOSIS — Z0289 Encounter for other administrative examinations: Secondary | ICD-10-CM

## 2017-09-26 DIAGNOSIS — M25562 Pain in left knee: Secondary | ICD-10-CM | POA: Diagnosis not present

## 2017-09-27 ENCOUNTER — Ambulatory Visit (INDEPENDENT_AMBULATORY_CARE_PROVIDER_SITE_OTHER): Payer: BLUE CROSS/BLUE SHIELD | Admitting: Ophthalmology

## 2017-09-30 DIAGNOSIS — M25562 Pain in left knee: Secondary | ICD-10-CM | POA: Diagnosis not present

## 2017-10-04 DIAGNOSIS — G4733 Obstructive sleep apnea (adult) (pediatric): Secondary | ICD-10-CM | POA: Diagnosis not present

## 2017-10-05 ENCOUNTER — Telehealth: Payer: Self-pay | Admitting: Internal Medicine

## 2017-10-05 ENCOUNTER — Telehealth: Payer: Self-pay | Admitting: Endocrinology

## 2017-10-05 DIAGNOSIS — M25562 Pain in left knee: Secondary | ICD-10-CM | POA: Diagnosis not present

## 2017-10-05 NOTE — Telephone Encounter (Signed)
   [facility] Pre-operative Risk Assessment    Request for surgical clearance:  1. What type of surgery is being performed? Left knee scope   2. When is this surgery scheduled? TBD   3. What type of clearance is required (medical clearance vs. Pharmacy clearance to hold med vs. Both)? Pharmacy  4. Are there any medications that need to be held prior to surgery and how long? Unknown   5. Practice name and name of physician performing surgery? [facility]  6. What is your office phone number [phone]    7.   What is your office fax number [phone]  8.   Anesthesia type (None, local, MAC, general) ? Per Anesthesiologist    Renee Lam 10/05/2017, 2:06 PM  _________________________________________________________________   (provider comments below)

## 2017-10-05 NOTE — Telephone Encounter (Signed)
Copied from [place] #[id number]. Topic: Quick Communication - See Telephone Encounter >> Oct 05, 2017  9:29 AM Arletha Grippe wrote: CRM for notification. See Telephone encounter for: 10/05/17.pt is having orthopaedic surgery.  It has not been scheduled. Pt is asking if she needs to have clearance appt.  She would like this  as soon as possible.  Please call [phone]

## 2017-10-05 NOTE — Telephone Encounter (Signed)
Tried to leave vm but mailbox is full.

## 2017-10-05 NOTE — Telephone Encounter (Signed)
I called and notified patient that Dr. Loanne Drilling was out of office this week. Her paperwork would be reviewed when he returned. I also scheduled her an earlier appointment in case he needed to see her for surgical clearance.

## 2017-10-05 NOTE — Telephone Encounter (Signed)
Patient stated that Dr Percell Miller is faxing over some information to our office for patient. She stated that they advise her to call our office to see if she would need surgical clearance.  Please advise

## 2017-10-05 NOTE — Telephone Encounter (Signed)
Left message to return call 

## 2017-10-05 NOTE — Telephone Encounter (Signed)
   Primary Cardiologist: Dr Radford Pax  Chart reviewed as part of pre-operative protocol coverage. Given past medical history and time since last visit, based on my phone call today, and ACC/AHA guidelines, Renee Lam would be at acceptable risk for the planned procedure without further cardiovascular testing.   OK to hold ASA as needed pre op, she had normal coronaries by cath 2012.   I will route this recommendation to the requesting party via Epic fax function and remove from pre-op pool.  Please call with questions.  Kerin Ransom, PA-C 10/05/2017, 3:04 PM

## 2017-10-06 NOTE — Telephone Encounter (Signed)
Spoke to patient and she stated that the form was fax 10/05/2017. I informed patient that I will keep a look out for the form. Patient also wanted to know if Dr.Kwiatkowski would fill out the diabetic portion since her endocrinologist is out on vacation.

## 2017-10-10 ENCOUNTER — Telehealth: Payer: Self-pay | Admitting: Interventional Cardiology

## 2017-10-10 DIAGNOSIS — Z124 Encounter for screening for malignant neoplasm of cervix: Secondary | ICD-10-CM | POA: Diagnosis not present

## 2017-10-10 DIAGNOSIS — Z6838 Body mass index (BMI) 38.0-38.9, adult: Secondary | ICD-10-CM | POA: Diagnosis not present

## 2017-10-10 DIAGNOSIS — G4733 Obstructive sleep apnea (adult) (pediatric): Secondary | ICD-10-CM | POA: Diagnosis not present

## 2017-10-10 DIAGNOSIS — Z01419 Encounter for gynecological examination (general) (routine) without abnormal findings: Secondary | ICD-10-CM | POA: Diagnosis not present

## 2017-10-10 DIAGNOSIS — Z1231 Encounter for screening mammogram for malignant neoplasm of breast: Secondary | ICD-10-CM | POA: Diagnosis not present

## 2017-10-10 NOTE — Telephone Encounter (Signed)
New Message:      Pt calling to see if we can re-fax her clearance papers to [place]. Fax # is  [phone]

## 2017-10-10 NOTE — Telephone Encounter (Signed)
Called patient to let her know her clearance would be resent to [place] at number provided. Patient verbalized understanding.

## 2017-10-10 NOTE — Telephone Encounter (Signed)
Patient calling back to check on the status of clearance for orthopaedic surgery? Was the form ever received? Please advise.

## 2017-10-11 NOTE — Telephone Encounter (Signed)
Dr.K do you remember seeing this form?

## 2017-10-11 NOTE — Telephone Encounter (Signed)
Yes this form has been completed and placed in my out box, probably April 29

## 2017-10-12 ENCOUNTER — Telehealth: Payer: Self-pay | Admitting: Endocrinology

## 2017-10-12 HISTORY — PX: MENISCUS REPAIR: SHX5179

## 2017-10-12 NOTE — Telephone Encounter (Signed)
Pt has f/u appt on 10/20/17.  We'll address then, or she can move up appt if necessary.

## 2017-10-12 NOTE — Telephone Encounter (Signed)
Patient want to know if Renee Lam cleared her for surgery, please advise

## 2017-10-12 NOTE — Telephone Encounter (Signed)
Patient notified that form was filled out 10/10/3017.

## 2017-10-12 NOTE — Telephone Encounter (Signed)
I placed this paperwork on your desk the end of last week. Please advise?

## 2017-10-13 NOTE — Telephone Encounter (Signed)
Patient has made appointment for tomorrow instead.

## 2017-10-14 ENCOUNTER — Encounter: Payer: Self-pay | Admitting: Endocrinology

## 2017-10-14 ENCOUNTER — Ambulatory Visit: Payer: BLUE CROSS/BLUE SHIELD | Admitting: Endocrinology

## 2017-10-14 VITALS — BP 128/74 | HR 84 | Wt 181.8 lb

## 2017-10-14 DIAGNOSIS — E119 Type 2 diabetes mellitus without complications: Secondary | ICD-10-CM

## 2017-10-14 LAB — POCT GLYCOSYLATED HEMOGLOBIN (HGB A1C): Hemoglobin A1C: 5.6

## 2017-10-14 MED ORDER — INSULIN GLARGINE 100 UNIT/ML SOLOSTAR PEN: 160.0000 [IU] | PEN_INJECTOR | SUBCUTANEOUS | 3 refills | Status: DC

## 2017-10-14 NOTE — Progress Notes (Signed)
Subjective:    Patient ID: Renee Lam, female    DOB: 05/12/1961, 57 y.o.   MRN: [id number]  HPI Pt returns for f/u of diabetes mellitus: DM type: Insulin-requiring type 2 Dx'ed: 1962 Complications: none Therapy: insulin since 2010, and victoza.  GDM: 1996 and 1999.  DKA: never.  Severe hypoglycemia: never.     Pancreatitis: never.  Other: she takes QD insulin, after poor results with multiple daily injections.   Interval history: she has mild hypoglycemia, when a meal is missed or delayed.  pt states she feels well in general.  She did not take steroids for the optic neuritis. She will have left knee arthroscopy soon.   Past Medical History:  Diagnosis Date  . Allergy   . Arthritis   . Asthma   . Chicken pox   . Diabetes mellitus   . Finger infection 2008   isolation  . GERD (gastroesophageal reflux disease)   . Heart murmur    benign per patient  . Hyperlipidemia   . Hypertension   . Migraine   . OSA (obstructive sleep apnea) 10/25/2016  . Osteoporosis   . Pericarditis   . Right sided sciatica 06/13/2017  . Sleep apnea    Patient is scheduled for sleep study tonight  . Viral meningitis     Past Surgical History:  Procedure Laterality Date  . CARDIAC CATHETERIZATION  2012  . CESAREAN SECTION     1997, 2000  . KNEE ARTHROSCOPY Right 2010  . ORIF HUMERUS FRACTURE Right 03/25/2015   Procedure: OPEN REDUCTION INTERNAL FIXATION (ORIF) DISTAL HUMERUS FRACTURE;  Surgeon: Renette Butters, MD;  Location: [facility];  Service: Orthopedics;  Laterality: Right;    Social History   Socioeconomic History  . Marital status: Divorced    Spouse name: Not on file  . Number of children: Not on file  . Years of education: Not on file  . Highest education level: Not on file  Occupational History  . Not on file  Social Needs  . Financial resource strain: Not on file  . Food insecurity:    Worry: Not on file    Inability: Not on file  .  Transportation needs:    Medical: Not on file    Non-medical: Not on file  Tobacco Use  . Smoking status: Never Smoker  . Smokeless tobacco: Never Used  Substance and Sexual Activity  . Alcohol use: Yes    Comment: once a year  . Drug use: No  . Sexual activity: Yes    Birth control/protection: Post-menopausal  Lifestyle  . Physical activity:    Days per week: Not on file    Minutes per session: Not on file  . Stress: Not on file  Relationships  . Social connections:    Talks on phone: Not on file    Gets together: Not on file    Attends religious service: Not on file    Active member of club or organization: Not on file    Attends meetings of clubs or organizations: Not on file    Relationship status: Not on file  . Intimate partner violence:    Fear of current or ex partner: Not on file    Emotionally abused: Not on file    Physically abused: Not on file    Forced sexual activity: Not on file  Other Topics Concern  . Not on file  Social History Narrative  . Not on file    Current  Outpatient Medications on File Prior to Visit  Medication Sig Dispense Refill  . albuterol (PROAIR HFA) 108 (90 Base) MCG/ACT inhaler Inhale 2 puffs into the lungs as needed. 18 g 3  . aspirin EC 325 MG tablet Take 1 tablet (325 mg total) by mouth daily. 30 tablet 0  . atorvastatin (LIPITOR) 80 MG tablet TAKE 1 TABLET BY MOUTH EVERYDAY AT BEDTIME 90 tablet 1  . BAYER CONTOUR NEXT TEST test strip USE TO TEST FOUR TIMES A DAY AS INSTRUCTED.  12  . CONTOUR NEXT TEST test strip USE TO TEST 4 TIMES A DAY AS INSTRUCTED 100 each 4  . diphenhydrAMINE (BENADRYL) 25 MG tablet Take 25 mg by mouth every 6 (six) hours as needed for allergies.     Marland Kitchen EPINEPHRINE 0.3 mg/0.3 mL IJ SOAJ injection INJECT 0.3 ML INTO THE MUSCLE ONCE 2 mL 0  . Fluticasone-Salmeterol (ADVAIR DISKUS) 250-50 MCG/DOSE AEPB Inhale 1 puff into the lungs 2 (two) times daily. 60 each 11  . Insulin Pen Needle (BD PEN NEEDLE NANO U/F) 32G X  4 MM MISC USE AS DIRECTED 4 TIMES A DAY 390 each 3  . methocarbamol (ROBAXIN) 500 MG tablet TAKE 1 TABLET (500 MG TOTAL) BY MOUTH EVERY 6 (SIX) HOURS AS NEEDED FOR MUSCLE SPASMS. 60 tablet 0  . montelukast (SINGULAIR) 10 MG tablet Take 1 tablet (10 mg total) by mouth at bedtime. 30 tablet 3  . naproxen (NAPROSYN) 500 MG tablet Take 1 tablet (500 mg total) by mouth 2 (two) times daily. 60 tablet 1  . ondansetron (ZOFRAN) 8 MG tablet Take 1 tablet (8 mg total) by mouth every 8 (eight) hours as needed for nausea or vomiting. 30 tablet 1  . traMADol (ULTRAM) 50 MG tablet Take 2 tablets (100 mg total) by mouth every 6 (six) hours as needed for moderate pain. 60 tablet 0  . VICTOZA 18 MG/3ML SOPN INJECT 0.3 MLS (1.8 MG TOTAL) INTO THE SKIN DAILY. 3 pen 7   No current facility-administered medications on file prior to visit.       Family History  Problem Relation Age of Onset  . Colon polyps Mother   . Diabetes Mother   . Heart failure Mother   . Peripheral vascular disease Mother   . Diabetes Father   . Heart disease Father   . Arthritis Father   . Hypertension Father   . Heart failure Father   . Prostate cancer Father   . Arthritis Other   . Hyperlipidemia Other   . Heart disease Other   . Hypertension Other   . Depression Other   . Ovarian cancer Other   . Uterine cancer Other   . Breast cancer Other   . Prostate cancer Other   . Breast cancer Cousin   . Colon cancer Neg Hx   . Stomach cancer Neg Hx   . Rectal cancer Neg Hx   . Pancreatic cancer Neg Hx     BP 128/74   Pulse 84   Wt 181 lb 12.8 oz (82.5 kg)   SpO2 96%   BMI 38.00 kg/m   Review of Systems Denies LOC.  She has lost 2 lbs since last ov    Objective:   Physical Exam VITAL SIGNS:  See vs page GENERAL: no distress Pulses: dorsalis pedis intact bilat.   MSK: no deformity of the feet CV: no leg edema Skin:  no ulcer on the feet.  normal color and temp on the feet. Neuro: sensation  is intact to touch on  the feet.   Lab Results  Component Value Date   HGBA1C 5.6 10/14/2017       Assessment & Plan:  Insulin-requiring type 2 DM: insulin requirement is decreasing Hypoglycemia: we should reduce insulin Knee pain: her surgical risk is low and outweighed by the potential benefit of the surgery.  She is therefore medically cleared from the standpoint of DM  Patient Instructions  check your blood sugar twice a day.  vary the time of day when you check, between before the 3 meals, and at bedtime.  also check if you have symptoms of your blood sugar being too high or too low.  please keep a record of the readings and bring it to your next appointment here.  You can write it on any piece of paper.  please call us sooner if your blood sugar goes below 70, or if you have a lot of readings over 200.  Please reduce the insulin to 160 units each morning.   If you start steroids, carefully check your blood sugar, and call if it goes over 200.   Please come back for a follow-up appointment in 4 months.

## 2017-10-14 NOTE — Patient Instructions (Addendum)
check your blood sugar twice a day.  vary the time of day when you check, between before the 3 meals, and at bedtime.  also check if you have symptoms of your blood sugar being too high or too low.  please keep a record of the readings and bring it to your next appointment here.  You can write it on any piece of paper.  please call us sooner if your blood sugar goes below 70, or if you have a lot of readings over 200.  Please reduce the insulin to 160 units each morning.   If you start steroids, carefully check your blood sugar, and call if it goes over 200.   Please come back for a follow-up appointment in 4 months.

## 2017-10-18 ENCOUNTER — Other Ambulatory Visit: Payer: Self-pay | Admitting: Internal Medicine

## 2017-10-20 ENCOUNTER — Ambulatory Visit: Payer: BLUE CROSS/BLUE SHIELD | Admitting: Endocrinology

## 2017-10-27 DIAGNOSIS — S83242A Other tear of medial meniscus, current injury, left knee, initial encounter: Secondary | ICD-10-CM | POA: Diagnosis not present

## 2017-10-27 DIAGNOSIS — G8918 Other acute postprocedural pain: Secondary | ICD-10-CM | POA: Diagnosis not present

## 2017-10-27 DIAGNOSIS — S83272A Complex tear of lateral meniscus, current injury, left knee, initial encounter: Secondary | ICD-10-CM | POA: Diagnosis not present

## 2017-10-27 DIAGNOSIS — M94262 Chondromalacia, left knee: Secondary | ICD-10-CM | POA: Diagnosis not present

## 2017-10-27 DIAGNOSIS — X58XXXA Exposure to other specified factors, initial encounter: Secondary | ICD-10-CM | POA: Diagnosis not present

## 2017-10-27 DIAGNOSIS — S83232A Complex tear of medial meniscus, current injury, left knee, initial encounter: Secondary | ICD-10-CM | POA: Diagnosis not present

## 2017-10-27 DIAGNOSIS — S83282A Other tear of lateral meniscus, current injury, left knee, initial encounter: Secondary | ICD-10-CM | POA: Diagnosis not present

## 2017-10-27 DIAGNOSIS — Y999 Unspecified external cause status: Secondary | ICD-10-CM | POA: Diagnosis not present

## 2017-10-29 ENCOUNTER — Ambulatory Visit (HOSPITAL_COMMUNITY)
Admission: EM | Admit: 2017-10-29 | Discharge: 2017-10-29 | Disposition: A | Discharge status: Home / Self Care | Payer: BLUE CROSS/BLUE SHIELD | Attending: Internal Medicine | Admitting: Internal Medicine

## 2017-10-29 ENCOUNTER — Encounter (HOSPITAL_COMMUNITY): Payer: Self-pay | Admitting: Emergency Medicine

## 2017-10-29 DIAGNOSIS — R739 Hyperglycemia, unspecified: Secondary | ICD-10-CM

## 2017-10-29 LAB — POCT URINALYSIS DIP (DEVICE)
Bilirubin Urine: NEGATIVE
Glucose, UA: 500 mg/dL — AB
Ketones, ur: NEGATIVE mg/dL
Leukocytes, UA: NEGATIVE
Nitrite: NEGATIVE
Protein, ur: NEGATIVE mg/dL
Specific Gravity, Urine: 1.02 (ref 1.005–1.030)
Urobilinogen, UA: 0.2 mg/dL (ref 0.0–1.0)
pH: 6 (ref 5.0–8.0)

## 2017-10-29 LAB — GLUCOSE, CAPILLARY: Glucose-Capillary: 330 mg/dL — ABNORMAL HIGH (ref 65–99)

## 2017-10-29 NOTE — ED Triage Notes (Signed)
Pt here for hyperglycemia and sts had sx on Thursday on her knee

## 2017-10-29 NOTE — ED Provider Notes (Signed)
[facility]    CSN: [id number] Arrival date & time: 10/29/17  1824     History   Chief Complaint Chief Complaint  Patient presents with  . Hyperglycemia    HPI Renee Lam is a 57 y.o. female.   Renee Lam presents with complaints of elevated blood sugar. States her sugar reached 466 today after lunch. She had  Meniscus surgery two days ago and states she has had mildly elevated sugars since, in the 200's. Has had some increased thirst and urine frequency. Without nausea, vomiting, diarrhea. Knee pain has improved, ambulatory with crutches. She is on victoza and once a day lantus. On 5/3 her lantus was decreased as her a1c was 5.6.  Hx of arthritis, asthma, dm, gerd, htn, osteoporosis, sciatica.    ROS per HPI.      Past Medical History:  Diagnosis Date  . Allergy   . Arthritis   . Asthma   . Chicken pox   . Diabetes mellitus   . Finger infection 2008   isolation  . GERD (gastroesophageal reflux disease)   . Heart murmur    benign per patient  . Hyperlipidemia   . Hypertension   . Migraine   . OSA (obstructive sleep apnea) 10/25/2016  . Osteoporosis   . Pericarditis   . Right sided sciatica 06/13/2017  . Sleep apnea    Patient is scheduled for sleep study tonight  . Viral meningitis     Patient Active Problem List   Diagnosis Date Noted  . 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 Right sided sciatica 06/13/2017  . Right knee pain 04/13/2017  . Wellness examination 03/24/2017  . OSA (obstructive sleep apnea) 10/25/2016  . Morbid obesity ([place]) 03/04/2016  . Diabetes mellitus type 2, controlled ([place]) 10/01/2015  . Diabetic retinopathy ([place]) 07/24/2015  . Fracture, humerus 03/25/2015  . Acute bronchitis with asthma 05/31/2013  . Asthma 06/18/2011  . Hypercholesterolemia 06/18/2011    Past Surgical History:  Procedure Laterality Date  . CARDIAC CATHETERIZATION  2012  . CESAREAN SECTION     1997, 2000  . KNEE ARTHROSCOPY  Right 2010  . ORIF HUMERUS FRACTURE Right 03/25/2015   Procedure: OPEN REDUCTION INTERNAL FIXATION (ORIF) DISTAL HUMERUS FRACTURE;  Surgeon: Renette Butters, MD;  Location: [facility];  Service: Orthopedics;  Laterality: Right;    OB History   None      Home Medications    Prior to Admission medications   Medication Sig Start Date End Date Taking? Authorizing Provider  albuterol (PROAIR HFA) 108 (90 Base) MCG/ACT inhaler Inhale 2 puffs into the lungs as needed. 08/29/17   Laurey Morale, MD  aspirin EC 325 MG tablet Take 1 tablet (325 mg total) by mouth daily. 06/23/17   Marletta Lor, MD  atorvastatin (LIPITOR) 80 MG tablet TAKE 1 TABLET BY MOUTH EVERYDAY AT BEDTIME 09/14/17   Marletta Lor, MD  BAYER CONTOUR NEXT TEST test strip USE TO TEST FOUR TIMES A DAY AS INSTRUCTED. 04/01/16   [provider]  CONTOUR NEXT TEST test strip USE TO TEST 4 TIMES A DAY AS INSTRUCTED 10/18/17   Marletta Lor, MD  diphenhydrAMINE (BENADRYL) 25 MG tablet Take 25 mg by mouth every 6 (six) hours as needed for allergies.     [provider]  EPINEPHRINE 0.3 mg/0.3 mL IJ SOAJ injection INJECT 0.3 ML INTO THE MUSCLE ONCE 02/10/17   Marletta Lor, MD  Fluticasone-Salmeterol (ADVAIR DISKUS) [phone]  MCG/DOSE AEPB Inhale 1 puff into the lungs 2 (two) times daily. 06/01/16   Kuneff, Renee A, DO  Insulin Glargine (LANTUS SOLOSTAR) 100 UNIT/ML Solostar Pen Inject 160 Units into the skin every morning. 10/14/17   Renato Shin, MD  Insulin Pen Needle (BD PEN NEEDLE NANO U/F) 32G X 4 MM MISC USE AS DIRECTED 4 TIMES A DAY 11/05/16   Renato Shin, MD  methocarbamol (ROBAXIN) 500 MG tablet TAKE 1 TABLET (500 MG TOTAL) BY MOUTH EVERY 6 (SIX) HOURS AS NEEDED FOR MUSCLE SPASMS. 08/10/17   Marletta Lor, MD  montelukast (SINGULAIR) 10 MG tablet Take 1 tablet (10 mg total) by mouth at bedtime. 06/01/16   Kuneff, Renee A, DO  naproxen (NAPROSYN) 500 MG tablet Take 1  tablet (500 mg total) by mouth 2 (two) times daily. 07/26/17   Marletta Lor, MD  ondansetron (ZOFRAN) 8 MG tablet Take 1 tablet (8 mg total) by mouth every 8 (eight) hours as needed for nausea or vomiting. 08/31/17   Laurey Morale, MD  traMADol (ULTRAM) 50 MG tablet Take 2 tablets (100 mg total) by mouth every 6 (six) hours as needed for moderate pain. 08/31/17   Laurey Morale, MD  VICTOZA 18 MG/3ML SOPN INJECT 0.3 MLS (1.8 MG TOTAL) INTO THE SKIN DAILY. 08/28/17   Renato Shin, MD    Family History Family History  Problem Relation Age of Onset  . Colon polyps Mother   . Diabetes Mother   . Heart failure Mother   . Peripheral vascular disease Mother   . Diabetes Father   . Heart disease Father   . Arthritis Father   . Hypertension Father   . Heart failure Father   . Prostate cancer Father   . Arthritis Other   . Hyperlipidemia Other   . Heart disease Other   . Hypertension Other   . Depression Other   . Ovarian cancer Other   . Uterine cancer Other   . Breast cancer Other   . Prostate cancer Other   . Breast cancer Cousin   . Colon cancer Neg Hx   . Stomach cancer Neg Hx   . Rectal cancer Neg Hx   . Pancreatic cancer Neg Hx     Social History Social History   Tobacco Use  . Smoking status: Never Smoker  . Smokeless tobacco: Never Used  Substance Use Topics  . Alcohol use: Yes    Comment: once a year  . Drug use: No     Allergies   Food; Penicillins; and Zithromax [azithromycin]   Review of Systems Review of Systems   Physical Exam Triage Vital Signs ED Triage Vitals [10/29/17 1943]  Enc Vitals Group     BP (!) 142/89     Pulse Rate 84     Resp 18     Temp 98.3 F (36.8 C)     Temp Source Oral     SpO2 100 %     Weight      Height      Head Circumference      Peak Flow      Pain Score      Pain Loc      Pain Edu?      Excl. in [place]?    No data found.  Updated Vital Signs BP (!) 142/89 (BP Location: Left Arm)   Pulse 84   Temp 98.3  F (36.8 C) (Oral)   Resp 18   SpO2 100%  Physical Exam  Constitutional: She is oriented to person, place, and time. She appears well-developed and well-nourished. No distress.  Cardiovascular: Normal rate, regular rhythm and normal heart sounds.  Pulmonary/Chest: Effort normal and breath sounds normal.  Musculoskeletal:  Left knee in ace, without visible drainage, without increased pain ; ambulatory with partial weight bearing and crutches.   Neurological: She is alert and oriented to person, place, and time.  Skin: Skin is warm and dry.     UC Treatments / Results  Labs (all labs ordered are listed, but only abnormal results are displayed) Labs Reviewed  GLUCOSE, CAPILLARY - Abnormal; Notable for the following components:      Result Value   Glucose-Capillary 330 (*)    All other components within normal limits  POCT URINALYSIS DIP (DEVICE) - Abnormal; Notable for the following components:   Glucose, UA 500 (*)    Hgb urine dipstick TRACE (*)    All other components within normal limits    EKG None  Radiology No results found.  Procedures Procedures (including critical care time)  Medications Ordered in UC Medications - No data to display  Initial Impression / Assessment and Plan / UC Course  I have reviewed the triage vital signs and the nursing notes.  Pertinent labs & imaging results that were available during my care of the patient were reviewed by me and considered in my medical decision making (see chart for details).     Blood sugar trending down in clinic tonight. Without ketones to urine. Recent decrease in lantus, due to managed a1c. Patient overall feeling well, knee without increased pain, no fevers or other concerns for infection. At this time recommended continued monitoring. Increase fluid intake, monitor food choices. If greater than 500 go to ER, or if signs of hyperglycemia. If persistent elevation follow up with PCP next week as lantus may need  to be temporarily adjusted. Patient verbalized understanding and agreeable to plan.  Ambulatory out of clinic without difficulty.    Final Clinical Impressions(s) / UC Diagnoses   Final diagnoses:  Hyperglycemia     Discharge Instructions     It is reassuring that your blood sugar is trending down tonight. Please increase your water intake as this can help lower your blood sugar. Continue to monitor. Monitor intake as your activity is likely less due to recent surgery. If get a reading greater than 500 or other signs of hyperglycemia please go to the er. If levels remain consistently elevated please call your PCP on Monday or Tuesday as your lantus may need to be temporarily adjusted.    ED Prescriptions    None     Controlled Substance Prescriptions [place] Controlled Substance Registry consulted? Not Applicable   Zigmund Gottron, NP 10/29/17 2114

## 2017-10-29 NOTE — Discharge Instructions (Signed)
It is reassuring that your blood sugar is trending down tonight. Please increase your water intake as this can help lower your blood sugar. Continue to monitor. Monitor intake as your activity is likely less due to recent surgery. If get a reading greater than 500 or other signs of hyperglycemia please go to the er. If levels remain consistently elevated please call your PCP on Monday or Tuesday as your lantus may need to be temporarily adjusted.

## 2017-10-31 ENCOUNTER — Telehealth: Payer: Self-pay | Admitting: Endocrinology

## 2017-10-31 NOTE — Telephone Encounter (Signed)
I called and asked patient to increase dosage. She stated that she would let us know how blood sugars are doing in a few days.

## 2017-10-31 NOTE — Telephone Encounter (Signed)
Please increase to 200 units qam Please call or message [place] next week, to tell us how the blood sugar is doing

## 2017-10-31 NOTE — Telephone Encounter (Signed)
Please advise 

## 2017-10-31 NOTE — Telephone Encounter (Signed)
Patient's blood sugars are running high since having a procedure last week. Blood sugars running from 209-289-on Saturday 466-went to urgent care. Very concerned. Please call patient at ph# [phone] to advise.

## 2017-11-03 DIAGNOSIS — G4733 Obstructive sleep apnea (adult) (pediatric): Secondary | ICD-10-CM | POA: Diagnosis not present

## 2017-11-08 ENCOUNTER — Ambulatory Visit: Payer: BLUE CROSS/BLUE SHIELD | Admitting: Endocrinology

## 2017-11-09 DIAGNOSIS — S83242D Other tear of medial meniscus, current injury, left knee, subsequent encounter: Secondary | ICD-10-CM | POA: Diagnosis not present

## 2017-11-09 DIAGNOSIS — S83282D Other tear of lateral meniscus, current injury, left knee, subsequent encounter: Secondary | ICD-10-CM | POA: Diagnosis not present

## 2017-11-21 DIAGNOSIS — S83242D Other tear of medial meniscus, current injury, left knee, subsequent encounter: Secondary | ICD-10-CM | POA: Diagnosis not present

## 2017-11-21 DIAGNOSIS — S83282D Other tear of lateral meniscus, current injury, left knee, subsequent encounter: Secondary | ICD-10-CM | POA: Diagnosis not present

## 2017-12-04 DIAGNOSIS — G4733 Obstructive sleep apnea (adult) (pediatric): Secondary | ICD-10-CM | POA: Diagnosis not present

## 2017-12-26 ENCOUNTER — Ambulatory Visit: Payer: BLUE CROSS/BLUE SHIELD | Admitting: Internal Medicine

## 2018-01-03 ENCOUNTER — Encounter: Payer: Self-pay | Admitting: Internal Medicine

## 2018-01-06 ENCOUNTER — Other Ambulatory Visit: Payer: Self-pay | Admitting: Endocrinology

## 2018-01-07 ENCOUNTER — Other Ambulatory Visit: Payer: Self-pay | Admitting: Internal Medicine

## 2018-01-07 DIAGNOSIS — W260XXA Contact with knife, initial encounter: Secondary | ICD-10-CM | POA: Diagnosis not present

## 2018-01-07 DIAGNOSIS — S61215A Laceration without foreign body of left ring finger without damage to nail, initial encounter: Secondary | ICD-10-CM | POA: Diagnosis not present

## 2018-01-18 ENCOUNTER — Ambulatory Visit (AMBULATORY_SURGERY_CENTER): Payer: Self-pay | Admitting: *Deleted

## 2018-01-18 VITALS — Ht <= 58 in | Wt 177.0 lb

## 2018-01-18 DIAGNOSIS — Z86010 Personal history of colonic polyps: Secondary | ICD-10-CM

## 2018-01-18 MED ORDER — NA SULFATE-K SULFATE-MG SULF 17.5-3.13-1.6 GM/177ML PO SOLN: 1.0000 | Freq: Once | ORAL | 0 refills | Status: AC

## 2018-01-18 NOTE — Progress Notes (Signed)
No egg or soy allergy known to patient  No issues with past sedation with any surgeries  or procedures, no intubation problems  No diet pills per patient No home 02 use per patient  No blood thinners per patient  Pt denies issues with constipation  No A fib or A flutter  EMMI video sent to pt's e mail -- pt declined  Pay no more than $50 Suprep coupon to pt today in [place]

## 2018-01-19 ENCOUNTER — Encounter: Payer: Self-pay | Admitting: Internal Medicine

## 2018-01-25 ENCOUNTER — Ambulatory Visit (AMBULATORY_SURGERY_CENTER): Payer: BLUE CROSS/BLUE SHIELD | Admitting: Internal Medicine

## 2018-01-25 ENCOUNTER — Encounter: Payer: Self-pay | Admitting: Internal Medicine

## 2018-01-25 VITALS — BP 131/82 | HR 76 | Temp 97.8°F | Resp 21 | Ht <= 58 in | Wt 177.0 lb

## 2018-01-25 DIAGNOSIS — Z86010 Personal history of colonic polyps: Secondary | ICD-10-CM | POA: Diagnosis not present

## 2018-01-25 DIAGNOSIS — D122 Benign neoplasm of ascending colon: Secondary | ICD-10-CM

## 2018-01-25 MED ORDER — SODIUM CHLORIDE 0.9 % IV SOLN: 500.0000 mL | Freq: Once | INTRAVENOUS | Status: DC

## 2018-01-25 NOTE — Op Note (Signed)
[facility] Patient Name: Renee Lam Procedure Date: 01/25/2018 10:45 AM MRN: [id number] Endoscopist: Docia Chuck. Henrene Pastor , MD Age: 57 Referring MD:  Date of Birth: Sep 20, 1960 Gender: Female Account #: [id number] Procedure:                Colonoscopy, with cold snare polypectomy x 1 Indications:              High risk colon cancer surveillance: Personal                            history of non-advanced adenoma. Previous exam                            January 2014 Medicines:                Monitored Anesthesia Care Procedure:                Pre-Anesthesia Assessment:                           - Prior to the procedure, a History and Physical                            was performed, and patient medications and                            allergies were reviewed. The patient's tolerance of                            previous anesthesia was also reviewed. The risks                            and benefits of the procedure and the sedation                            options and risks were discussed with the patient.                            All questions were answered, and informed consent                            was obtained. Prior Anticoagulants: The patient has                            taken no previous anticoagulant or antiplatelet                            agents. ASA Grade Assessment: II - A patient with                            mild systemic disease. After reviewing the risks                            and benefits, the patient was deemed in  satisfactory condition to undergo the procedure.                           After obtaining informed consent, the colonoscope                            was passed under direct vision. Throughout the                            procedure, the patient's blood pressure, pulse, and                            oxygen saturations were monitored continuously. The                            Colonoscope was  introduced through the anus and                            advanced to the the cecum, identified by                            appendiceal orifice and ileocecal valve. The                            ileocecal valve, appendiceal orifice, and rectum                            were photographed. The quality of the bowel                            preparation was excellent. The colonoscopy was                            performed without difficulty. The patient tolerated                            the procedure well. The bowel preparation used was                            SUPREP. Scope In: 10:53:43 AM Scope Out: 11:05:32 AM Scope Withdrawal Time: 0 hours 9 minutes 6 seconds  Total Procedure Duration: 0 hours 11 minutes 49 seconds  Findings:                 A 2 mm polyp was found in the ascending colon. The                            polyp was removed with a cold snare. Resection and                            retrieval were complete.                           A few small-mouthed diverticula were found in the  sigmoid colon.                           The exam was otherwise without abnormality on                            direct and retroflexion views. Complications:            No immediate complications. Estimated blood loss:                            None. Estimated Blood Loss:     Estimated blood loss: none. Impression:               - One 2 mm polyp in the ascending colon, removed                            with a cold snare. Resected and retrieved.                           - Diverticulosis in the sigmoid colon.                           - The examination was otherwise normal on direct                            and retroflexion views. Recommendation:           - Repeat colonoscopy in 5 years for surveillance.                           - Patient has a contact number available for                            emergencies. The signs and symptoms of potential                             delayed complications were discussed with the                            patient. Return to normal activities tomorrow.                            Written discharge instructions were provided to the                            patient.                           - Resume previous diet.                           - Continue present medications.                           - Await pathology results. Docia Chuck. Henrene Pastor, MD 01/25/2018 11:10:32 AM This report has been signed electronically.

## 2018-01-25 NOTE — Progress Notes (Signed)
Report to PACU, RN, vss, BBS= Clear.  

## 2018-01-25 NOTE — Patient Instructions (Signed)
HANDOUTS GIVEN FOR POLYPS AND DIVERTICULOSIS  YOU HAD AN ENDOSCOPIC PROCEDURE TODAY AT [facility]:   Refer to the procedure report that was given to you for any specific questions about what was found during the examination.  If the procedure report does not answer your questions, please call your gastroenterologist to clarify.  If you requested that your care partner not be given the details of your procedure findings, then the procedure report has been included in a sealed envelope for you to review at your convenience later.  YOU SHOULD EXPECT: Some feelings of bloating in the abdomen. Passage of more gas than usual.  Walking can help get rid of the air that was put into your GI tract during the procedure and reduce the bloating. If you had a lower endoscopy (such as a colonoscopy or flexible sigmoidoscopy) you may notice spotting of blood in your stool or on the toilet paper. If you underwent a bowel prep for your procedure, you may not have a normal bowel movement for a few days.  Please Note:  You might notice some irritation and congestion in your nose or some drainage.  This is from the oxygen used during your procedure.  There is no need for concern and it should clear up in a day or so.  SYMPTOMS TO REPORT IMMEDIATELY:   Following lower endoscopy (colonoscopy or flexible sigmoidoscopy):  Excessive amounts of blood in the stool  Significant tenderness or worsening of abdominal pains  Swelling of the abdomen that is new, acute  Fever of 100F or higher  For urgent or emergent issues, a gastroenterologist can be reached at any hour by calling [phone].   DIET:  We do recommend a small meal at first, but then you may proceed to your regular diet.  Drink plenty of fluids but you should avoid alcoholic beverages for 24 hours.  ACTIVITY:  You should plan to take it easy for the rest of today and you should NOT DRIVE or use heavy machinery until tomorrow (because of  the sedation medicines used during the test).    FOLLOW UP: Our staff will call the number listed on your records the next business day following your procedure to check on you and address any questions or concerns that you may have regarding the information given to you following your procedure. If we do not reach you, we will leave a message.  However, if you are feeling well and you are not experiencing any problems, there is no need to return our call.  We will assume that you have returned to your regular daily activities without incident.  If any biopsies were taken you will be contacted by phone or by letter within the next 1-3 weeks.  Please call us at [phone] if you have not heard about the biopsies in 3 weeks.    SIGNATURES/CONFIDENTIALITY: You and/or your care partner have signed paperwork which will be entered into your electronic medical record.  These signatures attest to the fact that that the information above on your After Visit Summary has been reviewed and is understood.  Full responsibility of the confidentiality of this discharge information lies with you and/or your care-partner.

## 2018-01-25 NOTE — Progress Notes (Signed)
D5  Started for blood sugar 76 per Dr. Henrene Pastor.

## 2018-01-25 NOTE — Progress Notes (Signed)
Called to room to assist during endoscopic procedure.  Patient ID and intended procedure confirmed with present staff. Received instructions for my participation in the procedure from the performing physician.  

## 2018-01-25 NOTE — Progress Notes (Signed)
Pt's states no medical or surgical changes since previsit or office visit. 

## 2018-01-26 ENCOUNTER — Telehealth: Payer: Self-pay | Admitting: *Deleted

## 2018-01-26 NOTE — Telephone Encounter (Signed)
  Follow up Call-  Call back number 01/25/2018 05/04/2016  Post procedure Call Back phone  # [phone]  Permission to leave phone message Yes Yes  Some recent data might be hidden     Patient questions:  Do you have a fever, pain , or abdominal swelling? No. Pain Score  0 *  Have you tolerated food without any problems? Yes.    Have you been able to return to your normal activities? Yes.    Do you have any questions about your discharge instructions: Diet   No. Medications  No. Follow up visit  No.  Do you have questions or concerns about your Care? No.  Actions: * If pain score is 4 or above: No action needed, pain <4.

## 2018-01-31 ENCOUNTER — Encounter: Payer: Self-pay | Admitting: Internal Medicine

## 2018-02-01 ENCOUNTER — Ambulatory Visit: Payer: BLUE CROSS/BLUE SHIELD | Admitting: Internal Medicine

## 2018-02-09 ENCOUNTER — Other Ambulatory Visit: Payer: Self-pay | Admitting: Emergency Medicine

## 2018-02-09 MED ORDER — INSULIN PEN NEEDLE 32G X 4 MM MISC: 3 refills | Status: DC

## 2018-02-14 ENCOUNTER — Ambulatory Visit: Payer: BLUE CROSS/BLUE SHIELD | Admitting: Endocrinology

## 2018-02-14 VITALS — BP 128/88 | HR 85 | Ht <= 58 in | Wt 176.2 lb

## 2018-02-14 DIAGNOSIS — E119 Type 2 diabetes mellitus without complications: Secondary | ICD-10-CM

## 2018-02-14 LAB — POCT GLYCOSYLATED HEMOGLOBIN (HGB A1C): Hemoglobin A1C: 6.8 % — AB (ref 4.0–5.6)

## 2018-02-14 MED ORDER — INSULIN GLARGINE 100 UNIT/ML SOLOSTAR PEN: 150.0000 [IU] | PEN_INJECTOR | SUBCUTANEOUS | 3 refills | Status: DC

## 2018-02-14 NOTE — Progress Notes (Signed)
Subjective:    Patient ID: Renee Lam, female    DOB: [birth year], 57 y.o.   MRN: [id number]  HPI Pt returns for f/u of diabetes mellitus: DM type: Insulin-requiring type 2 Dx'ed: 4888 Complications: right optic ischemia.   Therapy: insulin since 2010, and victoza.  GDM: 1996 and 1999.  DKA: never.  Severe hypoglycemia: never.     Pancreatitis: never.  Other: she takes QD insulin, after poor results with multiple daily injections.   Interval history: she has mild hypoglycemia approx twice per month, when a meal is missed or delayed.  pt states she feels well in general.  Past Medical History:  Diagnosis Date  . Allergy   . Arthritis   . Asthma   . Chicken pox   . Diabetes mellitus   . Finger infection 2008   isolation  . GERD (gastroesophageal reflux disease)   . Heart murmur    benign per patient  . Hyperlipidemia   . Hypertension    past hx   . Ischemic optic neuropathy    right eye  . Migraine   . OSA (obstructive sleep apnea) 10/25/2016  . Osteoporosis   . Pericarditis   . Right sided sciatica 06/13/2017  . Sleep apnea    Patient is scheduled for sleep study tonight- wears cpap  . Viral meningitis     Past Surgical History:  Procedure Laterality Date  . CARDIAC CATHETERIZATION  2012  . CESAREAN SECTION     1997, 2000  . COLONOSCOPY    . KNEE ARTHROSCOPY Right 2010  . MENISCUS REPAIR Left 10/2017  . ORIF HUMERUS FRACTURE Right 03/25/2015   Procedure: OPEN REDUCTION INTERNAL FIXATION (ORIF) DISTAL HUMERUS FRACTURE;  Surgeon: Renette Butters, MD;  Location: [place];  Service: Orthopedics;  Laterality: Right;  . POLYPECTOMY      Social History   Socioeconomic History  . Marital status: Divorced    Spouse name: Not on file  . Number of children: Not on file  . Years of education: Not on file  . Highest education level: Not on file  Occupational History  . Not on file  Social Needs  . Financial resource strain: Not on file    . Food insecurity:    Worry: Not on file    Inability: Not on file  . Transportation needs:    Medical: Not on file    Non-medical: Not on file  Tobacco Use  . Smoking status: Never Smoker  . Smokeless tobacco: Never Used  Substance and Sexual Activity  . Alcohol use: Yes    Comment: once a year  . Drug use: No  . Sexual activity: Yes    Birth control/protection: Post-menopausal  Lifestyle  . Physical activity:    Days per week: Not on file    Minutes per session: Not on file  . Stress: Not on file  Relationships  . Social connections:    Talks on phone: Not on file    Gets together: Not on file    Attends religious service: Not on file    Active member of club or organization: Not on file    Attends meetings of clubs or organizations: Not on file    Relationship status: Not on file  . Intimate partner violence:    Fear of current or ex partner: Not on file    Emotionally abused: Not on file    Physically abused: Not on file    Forced sexual activity: Not  on file  Other Topics Concern  . Not on file  Social History Narrative  . Not on file    Current Outpatient Medications on File Prior to Visit  Medication Sig Dispense Refill  . albuterol (PROAIR HFA) 108 (90 Base) MCG/ACT inhaler Inhale 2 puffs into the lungs as needed. 18 g 3  . aspirin EC 325 MG tablet Take 1 tablet (325 mg total) by mouth daily. 30 tablet 0  . aspirin EC 81 MG tablet Take 81 mg by mouth daily.    . Aspirin-Acetaminophen-Caffeine (EXCEDRIN EXTRA STRENGTH PO) Take by mouth as needed.    Marland Kitchen atorvastatin (LIPITOR) 80 MG tablet TAKE 1 TABLET BY MOUTH EVERYDAY AT BEDTIME 90 tablet 1  . BAYER CONTOUR NEXT TEST test strip USE TO TEST FOUR TIMES A DAY AS INSTRUCTED.  12  . CONTOUR NEXT TEST test strip USE TO TEST 4 TIMES A DAY AS INSTRUCTED 300 each 1  . diphenhydrAMINE (BENADRYL) 25 MG tablet Take 25 mg by mouth every 6 (six) hours as needed for allergies.     Marland Kitchen EPINEPHRINE 0.3 mg/0.3 mL IJ SOAJ  injection INJECT 0.3 ML INTO THE MUSCLE ONCE 2 mL 0  . Fluticasone-Salmeterol (ADVAIR DISKUS) 250-50 MCG/DOSE AEPB Inhale 1 puff into the lungs 2 (two) times daily. 60 each 11  . Insulin Pen Needle (BD PEN NEEDLE NANO U/F) 32G X 4 MM MISC USE AS DIRECTED 4 TIMES A DAY 390 each 3  . montelukast (SINGULAIR) 10 MG tablet Take 1 tablet (10 mg total) by mouth at bedtime. 30 tablet 3  . naproxen (NAPROSYN) 500 MG tablet Take 1 tablet (500 mg total) by mouth 2 (two) times daily. 60 tablet 1  . ondansetron (ZOFRAN) 8 MG tablet Take 1 tablet (8 mg total) by mouth every 8 (eight) hours as needed for nausea or vomiting. 30 tablet 1  . VICTOZA 18 MG/3ML SOPN INJECT 0.3 MLS (1.8 MG TOTAL) INTO THE SKIN DAILY. 3 pen 7  . methocarbamol (ROBAXIN) 500 MG tablet TAKE 1 TABLET (500 MG TOTAL) BY MOUTH EVERY 6 (SIX) HOURS AS NEEDED FOR MUSCLE SPASMS. (Patient not taking: Reported on 02/14/2018) 60 tablet 0  . traMADol (ULTRAM) 50 MG tablet Take 2 tablets (100 mg total) by mouth every 6 (six) hours as needed for moderate pain. (Patient not taking: Reported on 02/14/2018) 60 tablet 0   No current facility-administered medications on file prior to visit.      Family History  Problem Relation Age of Onset  . Colon polyps Mother   . Diabetes Mother   . Heart failure Mother   . Peripheral vascular disease Mother   . Diabetes Father   . Heart disease Father   . Arthritis Father   . Hypertension Father   . Heart failure Father   . Prostate cancer Father   . Arthritis Other   . Hyperlipidemia Other   . Heart disease Other   . Hypertension Other   . Depression Other   . Ovarian cancer Other   . Uterine cancer Other   . Breast cancer Other   . Prostate cancer Other   . Breast cancer Cousin   . Colon cancer Neg Hx   . Stomach cancer Neg Hx   . Rectal cancer Neg Hx   . Pancreatic cancer Neg Hx   . Esophageal cancer Neg Hx     BP 128/88 (BP Location: Right Arm)   Pulse 85   Ht 4\' 10"  (1.473 m)   Wt  176 lb 3.2  oz (79.9 kg)   SpO2 95%   BMI 36.83 kg/m    Review of Systems She denies LOC    Objective:   Physical Exam VITAL SIGNS:  See vs page GENERAL: no distress Pulses: foot pulses are intact bilaterally.   MSK: no deformity of the feet or ankles.  CV: no edema of the legs or ankles Skin:  no ulcer on the feet or ankles, but the skin is dry.  normal color and temp on the feet and ankles.   Neuro: sensation is intact to touch on the feet and ankles.     Lab Results  Component Value Date   CREATININE 0.64 03/24/2017   BUN 10 03/24/2017   NA 141 03/24/2017   K 3.7 03/24/2017   CL 104 03/24/2017   CO2 29 03/24/2017     Lab Results  Component Value Date   HGBA1C 6.8 (A) 02/14/2018      Assessment & Plan:  Insulin-requiring type 2 DM: overcontrolled.    Patient Instructions  check your blood sugar twice a day.  vary the time of day when you check, between before the 3 meals, and at bedtime.  also check if you have symptoms of your blood sugar being too high or too low.  please keep a record of the readings and bring it to your next appointment here.  You can write it on any piece of paper.  please call us sooner if your blood sugar goes below 70, or if you have a lot of readings over 200.  Please reduce the insulin to 150 units each morning, and: Please continue the same victoza.   Please come back for a follow-up appointment in 4 months.

## 2018-02-14 NOTE — Patient Instructions (Addendum)
check your blood sugar twice a day.  vary the time of day when you check, between before the 3 meals, and at bedtime.  also check if you have symptoms of your blood sugar being too high or too low.  please keep a record of the readings and bring it to your next appointment here.  You can write it on any piece of paper.  please call us sooner if your blood sugar goes below 70, or if you have a lot of readings over 200.  Please reduce the insulin to 150 units each morning, and: Please continue the same victoza.   Please come back for a follow-up appointment in 4 months.

## 2018-02-15 ENCOUNTER — Ambulatory Visit: Payer: BLUE CROSS/BLUE SHIELD | Admitting: Internal Medicine

## 2018-02-15 ENCOUNTER — Encounter: Payer: Self-pay | Admitting: Internal Medicine

## 2018-02-15 VITALS — BP 106/60 | HR 78 | Temp 98.5°F | Wt 177.2 lb

## 2018-02-15 DIAGNOSIS — J4540 Moderate persistent asthma, uncomplicated: Secondary | ICD-10-CM

## 2018-02-15 DIAGNOSIS — E7800 Pure hypercholesterolemia, unspecified: Secondary | ICD-10-CM | POA: Diagnosis not present

## 2018-02-15 DIAGNOSIS — J4520 Mild intermittent asthma, uncomplicated: Secondary | ICD-10-CM | POA: Diagnosis not present

## 2018-02-15 DIAGNOSIS — E1121 Type 2 diabetes mellitus with diabetic nephropathy: Secondary | ICD-10-CM

## 2018-02-15 DIAGNOSIS — J9801 Acute bronchospasm: Secondary | ICD-10-CM

## 2018-02-15 MED ORDER — MONTELUKAST SODIUM 10 MG PO TABS: 10.0000 mg | ORAL_TABLET | Freq: Every day | ORAL | 3 refills | Status: DC

## 2018-02-15 MED ORDER — TRAMADOL HCL 50 MG PO TABS: 100.0000 mg | ORAL_TABLET | Freq: Four times a day (QID) | ORAL | 0 refills | Status: DC | PRN

## 2018-02-15 MED ORDER — ATORVASTATIN CALCIUM 80 MG PO TABS: ORAL_TABLET | ORAL | 4 refills | Status: AC

## 2018-02-15 MED ORDER — FLUTICASONE-SALMETEROL 250-50 MCG/DOSE IN AEPB: 1.0000 | INHALATION_SPRAY | Freq: Two times a day (BID) | RESPIRATORY_TRACT | 11 refills | Status: DC

## 2018-02-15 MED ORDER — ALBUTEROL SULFATE HFA 108 (90 BASE) MCG/ACT IN AERS: 2.0000 | INHALATION_SPRAY | RESPIRATORY_TRACT | 3 refills | Status: DC | PRN

## 2018-02-15 NOTE — Progress Notes (Signed)
Subjective:    Patient ID: Renee Lam, female    DOB: [birth year], 57 y.o.   MRN: [id number]  HPI  Lab Results  Component Value Date   HGBA1C 6.8 (A) 02/14/2018   57 year old patient who is followed closely by endocrinology with diabetes. She was seen yesterday with a hemoglobin A1c of 6.8. She has had some recent arthroscopic knee surgery and doing quite well.  She does have a history of mild asthma that has been stable on her present regimen. History of right ischemic neuropathy.  She is scheduled for follow-up with neuro-ophthalmology early next month She has done well following right knee surgery.  She remains on high-dose statin therapy for dyslipidemia.  Past Medical History:  Diagnosis Date  . Allergy   . Arthritis   . Asthma   . Chicken pox   . Diabetes mellitus   . Finger infection 2008   isolation  . GERD (gastroesophageal reflux disease)   . Heart murmur    benign per patient  . Hyperlipidemia   . Hypertension    past hx   . Ischemic optic neuropathy    right eye  . Migraine   . OSA (obstructive sleep apnea) 10/25/2016  . Osteoporosis   . Pericarditis   . Right sided sciatica 06/13/2017  . Sleep apnea    Patient is scheduled for sleep study tonight- wears cpap  . Viral meningitis      Social History   Socioeconomic History  . Marital status: Divorced    Spouse name: Not on file  . Number of children: Not on file  . Years of education: Not on file  . Highest education level: Not on file  Occupational History  . Not on file  Social Needs  . Financial resource strain: Not on file  . Food insecurity:    Worry: Not on file    Inability: Not on file  . Transportation needs:    Medical: Not on file    Non-medical: Not on file  Tobacco Use  . Smoking status: Never Smoker  . Smokeless tobacco: Never Used  Substance and Sexual Activity  . Alcohol use: Yes    Comment: once a year  . Drug use: No  . Sexual activity: Yes    Birth  control/protection: Post-menopausal  Lifestyle  . Physical activity:    Days per week: Not on file    Minutes per session: Not on file  . Stress: Not on file  Relationships  . Social connections:    Talks on phone: Not on file    Gets together: Not on file    Attends religious service: Not on file    Active member of club or organization: Not on file    Attends meetings of clubs or organizations: Not on file    Relationship status: Not on file  . Intimate partner violence:    Fear of current or ex partner: Not on file    Emotionally abused: Not on file    Physically abused: Not on file    Forced sexual activity: Not on file  Other Topics Concern  . Not on file  Social History Narrative  . Not on file    Past Surgical History:  Procedure Laterality Date  . CARDIAC CATHETERIZATION  2012  . CESAREAN SECTION     1997, 2000  . COLONOSCOPY    . KNEE ARTHROSCOPY Right 2010  . MENISCUS REPAIR Left 10/2017  . ORIF HUMERUS FRACTURE Right 03/25/2015  Procedure: OPEN REDUCTION INTERNAL FIXATION (ORIF) DISTAL HUMERUS FRACTURE;  Surgeon: Renette Butters, MD;  Location: [place];  Service: Orthopedics;  Laterality: Right;  . POLYPECTOMY      Family History  Problem Relation Age of Onset  . Colon polyps Mother   . Diabetes Mother   . Heart failure Mother   . Peripheral vascular disease Mother   . Diabetes Father   . Heart disease Father   . Arthritis Father   . Hypertension Father   . Heart failure Father   . Prostate cancer Father   . Arthritis Other   . Hyperlipidemia Other   . Heart disease Other   . Hypertension Other   . Depression Other   . Ovarian cancer Other   . Uterine cancer Other   . Breast cancer Other   . Prostate cancer Other   . Breast cancer Cousin   . Colon cancer Neg Hx   . Stomach cancer Neg Hx   . Rectal cancer Neg Hx   . Pancreatic cancer Neg Hx   . Esophageal cancer Neg Hx       Current Outpatient Medications on File Prior  to Visit  Medication Sig Dispense Refill  . aspirin EC 81 MG tablet Take 81 mg by mouth daily.    . Aspirin-Acetaminophen-Caffeine (EXCEDRIN EXTRA STRENGTH PO) Take by mouth as needed.    Marland Kitchen BAYER CONTOUR NEXT TEST test strip USE TO TEST FOUR TIMES A DAY AS INSTRUCTED.  12  . CONTOUR NEXT TEST test strip USE TO TEST 4 TIMES A DAY AS INSTRUCTED 300 each 1  . diphenhydrAMINE (BENADRYL) 25 MG tablet Take 25 mg by mouth every 6 (six) hours as needed for allergies.     Marland Kitchen EPINEPHRINE 0.3 mg/0.3 mL IJ SOAJ injection INJECT 0.3 ML INTO THE MUSCLE ONCE 2 mL 0  . Insulin Glargine (LANTUS SOLOSTAR) 100 UNIT/ML Solostar Pen Inject 150 Units into the skin every morning. 60 mL 3  . Insulin Pen Needle (BD PEN NEEDLE NANO U/F) 32G X 4 MM MISC USE AS DIRECTED 4 TIMES A DAY 390 each 3  . naproxen (NAPROSYN) 500 MG tablet Take 1 tablet (500 mg total) by mouth 2 (two) times daily. 60 tablet 1  . ondansetron (ZOFRAN) 8 MG tablet Take 1 tablet (8 mg total) by mouth every 8 (eight) hours as needed for nausea or vomiting. 30 tablet 1  . VICTOZA 18 MG/3ML SOPN INJECT 0.3 MLS (1.8 MG TOTAL) INTO THE SKIN DAILY. 3 pen 7   No current facility-administered medications on file prior to visit.     BP 106/60 (BP Location: Right Arm, Patient Position: Sitting, Cuff Size: Large)   Pulse 78   Temp 98.5 F (36.9 C) (Oral)   Wt 177 lb 3.2 oz (80.4 kg)   SpO2 98%   BMI 37.03 kg/m     Review of Systems  Constitutional: Negative.   HENT: Negative for congestion, dental problem, hearing loss, rhinorrhea, sinus pressure, sore throat and tinnitus.   Eyes: Positive for visual disturbance. Negative for pain and discharge.  Respiratory: Negative for cough and shortness of breath.   Cardiovascular: Negative for chest pain, palpitations and leg swelling.  Gastrointestinal: Negative for abdominal distention, abdominal pain, blood in stool, constipation, diarrhea, nausea and vomiting.  Genitourinary: Negative for difficulty  urinating, dysuria, flank pain, frequency, hematuria, pelvic pain, urgency, vaginal bleeding, vaginal discharge and vaginal pain.  Musculoskeletal: Negative for arthralgias, gait problem and joint swelling.  Skin:  Negative for rash.  Neurological: Negative for dizziness, syncope, speech difficulty, weakness, numbness and headaches.  Hematological: Negative for adenopathy.  Psychiatric/Behavioral: Negative for agitation, behavioral problems and dysphoric mood. The patient is not nervous/anxious.        Objective:   Physical Exam  Constitutional: She is oriented to person, place, and time. She appears well-developed and well-nourished. No distress.  Blood pressure low normal Weight 177  HENT:  Head: Normocephalic.  Right Ear: External ear normal.  Left Ear: External ear normal.  Mouth/Throat: Oropharynx is clear and moist.  Eyes: Pupils are equal, round, and reactive to light. Conjunctivae and EOM are normal.  Neck: Normal range of motion. Neck supple. No thyromegaly present.  Cardiovascular: Normal rate, regular rhythm, normal heart sounds and intact distal pulses.  Pulmonary/Chest: Effort normal and breath sounds normal.  Abdominal: Soft. Bowel sounds are normal. She exhibits no mass. There is no tenderness.  Musculoskeletal: Normal range of motion.  Lymphadenopathy:    She has no cervical adenopathy.  Neurological: She is alert and oriented to person, place, and time.  Skin: Skin is warm and dry. No rash noted.  Psychiatric: She has a normal mood and affect. Her behavior is normal.          Assessment & Plan:   Diabetes mellitus.  Well-controlled.  Follow-up endocrinology Asthma.  Well-controlled.  No change in therapy History of ischemic right optic neuropathy.  Follow-up neuro ophthalmology Dyslipidemia continue high intensity statin therapy  All medications updated Patient will follow-up with a new provider in approximately 6 months Endocrinology and ophthalmology  follow-up as scheduled  Marletta Lor

## 2018-02-15 NOTE — Patient Instructions (Signed)
Please check your hemoglobin A1c every 3 months    It is important that you exercise regularly, at least 20 minutes 3 to 4 times per week.  If you develop chest pain or shortness of breath seek  medical attention.  Please see your eye doctor yearly to check for diabetic eye damage  Return in 6 months for follow-up

## 2018-03-14 ENCOUNTER — Ambulatory Visit: Payer: BLUE CROSS/BLUE SHIELD | Admitting: Cardiology

## 2018-03-17 DIAGNOSIS — H43813 Vitreous degeneration, bilateral: Secondary | ICD-10-CM | POA: Diagnosis not present

## 2018-03-17 DIAGNOSIS — H5340 Unspecified visual field defects: Secondary | ICD-10-CM | POA: Diagnosis not present

## 2018-03-17 DIAGNOSIS — H47011 Ischemic optic neuropathy, right eye: Secondary | ICD-10-CM | POA: Diagnosis not present

## 2018-03-17 DIAGNOSIS — H25813 Combined forms of age-related cataract, bilateral: Secondary | ICD-10-CM | POA: Diagnosis not present

## 2018-03-17 DIAGNOSIS — H4710 Unspecified papilledema: Secondary | ICD-10-CM | POA: Diagnosis not present

## 2018-04-05 DIAGNOSIS — M25561 Pain in right knee: Secondary | ICD-10-CM | POA: Diagnosis not present

## 2018-04-05 DIAGNOSIS — M25551 Pain in right hip: Secondary | ICD-10-CM | POA: Diagnosis not present

## 2018-04-07 ENCOUNTER — Other Ambulatory Visit: Payer: Self-pay | Admitting: Endocrinology

## 2018-04-18 NOTE — Progress Notes (Deleted)
Cardiology Office Note:    Date:  04/18/2018   ID:  Renee Lam, DOB Nov 22, 1960, MRN [id number]  PCP:  Marletta Lor, MD  Cardiologist:  No primary care provider on file.    Referring MD: Marletta Lor, MD   No chief complaint on file.   History of Present Illness:    Renee Lam is a 57 y.o. female with a hx of severe OSA with an AHI of 46.8/hr with oxygen desaturations as low as 72% with loud snoring.  She got a new machine and was set on 18cm H2O but did not tolerate this so she was placed on an auto setting.  She is doing well with her CPAP device and thinks that she has gotten used to it.  She tolerates the mask and feels the pressure is adequate.  Since going on CPAP she feels rested in the am and has no significant daytime sleepiness.  She denies any significant mouth or nasal dryness or nasal congestion.  She does not think that he snores.     Past Medical History:  Diagnosis Date  . Allergy   . Arthritis   . Asthma   . Chicken pox   . Diabetes mellitus   . Finger infection 2008   isolation  . GERD (gastroesophageal reflux disease)   . Heart murmur    benign per patient  . Hyperlipidemia   . Hypertension    past hx   . Ischemic optic neuropathy    right eye  . Migraine   . OSA (obstructive sleep apnea) 10/25/2016  . Osteoporosis   . Pericarditis   . Right sided sciatica 06/13/2017  . Sleep apnea    Patient is scheduled for sleep study tonight- wears cpap  . Viral meningitis     Past Surgical History:  Procedure Laterality Date  . CARDIAC CATHETERIZATION  2012  . CESAREAN SECTION     1997, 2000  . COLONOSCOPY    . KNEE ARTHROSCOPY Right 2010  . MENISCUS REPAIR Left 10/2017  . ORIF HUMERUS FRACTURE Right 03/25/2015   Procedure: OPEN REDUCTION INTERNAL FIXATION (ORIF) DISTAL HUMERUS FRACTURE;  Surgeon: Renette Butters, MD;  Location: [facility];  Service: Orthopedics;  Laterality: Right;  . POLYPECTOMY       Current Medications: No outpatient medications have been marked as taking for the 04/19/18 encounter (Appointment) with Sueanne Margarita, MD.     Allergies:   Food; Penicillins; and Zithromax [azithromycin]   Social History   Socioeconomic History  . Marital status: Divorced    Spouse name: Not on file  . Number of children: Not on file  . Years of education: Not on file  . Highest education level: Not on file  Occupational History  . Not on file  Social Needs  . Financial resource strain: Not on file  . Food insecurity:    Worry: Not on file    Inability: Not on file  . Transportation needs:    Medical: Not on file    Non-medical: Not on file  Tobacco Use  . Smoking status: Never Smoker  . Smokeless tobacco: Never Used  Substance and Sexual Activity  . Alcohol use: Yes    Comment: once a year  . Drug use: No  . Sexual activity: Yes    Birth control/protection: Post-menopausal  Lifestyle  . Physical activity:    Days per week: Not on file    Minutes per session: Not on file  .  Stress: Not on file  Relationships  . Social connections:    Talks on phone: Not on file    Gets together: Not on file    Attends religious service: Not on file    Active member of club or organization: Not on file    Attends meetings of clubs or organizations: Not on file    Relationship status: Not on file  Other Topics Concern  . Not on file  Social History Narrative  . Not on file     Family History: The patient's family history includes Arthritis in her father and other; Breast cancer in her cousin and other; Colon polyps in her mother; Depression in her other; Diabetes in her father and mother; Heart disease in her father and other; Heart failure in her father and mother; Hyperlipidemia in her other; Hypertension in her father and other; Ovarian cancer in her other; Peripheral vascular disease in her mother; Prostate cancer in her father and other; Uterine cancer in her other.  There is no history of Colon cancer, Stomach cancer, Rectal cancer, Pancreatic cancer, or Esophageal cancer.  ROS:   Please see the history of present illness.    ROS  All other systems reviewed and negative.   EKGs/Labs/Other Studies Reviewed:    The following studies were reviewed today: PAP download  EKG:  EKG is not ordered today.    Recent Labs: No results found for requested labs within last 8760 hours.   Recent Lipid Panel    Component Value Date/Time   CHOL 123 03/24/2017 0843   TRIG 152.0 (H) 03/24/2017 0843   HDL 46.90 03/24/2017 0843   CHOLHDL 3 03/24/2017 0843   VLDL 30.4 03/24/2017 0843   LDLCALC 45 03/24/2017 0843    Physical Exam:    VS:  There were no vitals taken for this visit.    Wt Readings from Last 3 Encounters:  02/15/18 177 lb 3.2 oz (80.4 kg)  02/14/18 176 lb 3.2 oz (79.9 kg)  01/25/18 177 lb (80.3 kg)     GEN:  Well nourished, well developed in no acute distress HEENT: Normal NECK: No JVD; No carotid bruits LYMPHATICS: No lymphadenopathy CARDIAC: RRR, no murmurs, rubs, gallops RESPIRATORY:  Clear to auscultation without rales, wheezing or rhonchi  ABDOMEN: Soft, non-tender, non-distended MUSCULOSKELETAL:  No edema; No deformity  SKIN: Warm and dry NEUROLOGIC:  Alert and oriented x 3 PSYCHIATRIC:  Normal affect   ASSESSMENT:    1. OSA (obstructive sleep apnea)   2. Morbid obesity ([place])    PLAN:    In order of problems listed above:  1.  OSA - the patient is tolerating PAP therapy well without any problems. The PAP download was reviewed today and showed an AHI of ***/hr on auto PAP  with ***% compliance in using more than 4 hours nightly.  The patient has been using and benefiting from PAP use and will continue to benefit from therapy.   2.  Morbid obesity - I have encouraged her to get into a routine exercise program and cut back on carbs and portions.    Medication Adjustments/Labs and Tests Ordered: Current medicines are  reviewed at length with the patient today.  Concerns regarding medicines are outlined above.  No orders of the defined types were placed in this encounter.  No orders of the defined types were placed in this encounter.   Signed, Fransico Him, MD  04/18/2018 10:49 PM    [place]

## 2018-04-19 ENCOUNTER — Ambulatory Visit: Payer: BLUE CROSS/BLUE SHIELD | Admitting: Cardiology

## 2018-04-21 ENCOUNTER — Encounter: Payer: Self-pay | Admitting: Family Medicine

## 2018-04-21 ENCOUNTER — Ambulatory Visit: Payer: BLUE CROSS/BLUE SHIELD | Admitting: Family Medicine

## 2018-04-21 VITALS — BP 108/64 | HR 90 | Temp 98.8°F | Wt 178.0 lb

## 2018-04-21 DIAGNOSIS — J029 Acute pharyngitis, unspecified: Secondary | ICD-10-CM

## 2018-04-21 DIAGNOSIS — R52 Pain, unspecified: Secondary | ICD-10-CM

## 2018-04-21 DIAGNOSIS — J101 Influenza due to other identified influenza virus with other respiratory manifestations: Secondary | ICD-10-CM | POA: Diagnosis not present

## 2018-04-21 LAB — POCT RAPID STREP A (OFFICE): Rapid Strep A Screen: NEGATIVE

## 2018-04-21 LAB — POCT INFLUENZA A/B
Influenza A, POC: POSITIVE — AB
Influenza B, POC: NEGATIVE

## 2018-04-21 MED ORDER — OSELTAMIVIR PHOSPHATE 75 MG PO CAPS: 75.0000 mg | ORAL_CAPSULE | Freq: Two times a day (BID) | ORAL | 0 refills | Status: AC

## 2018-04-21 NOTE — Progress Notes (Signed)
Subjective:    Patient ID: Renee Lam, female    DOB: 1960-11-16, 57 y.o.   MRN: [id number]  No chief complaint on file.   HPI Patient was seen today for acute concern.  Pt endorses cough, chest congestion, body aches, ear pressure, SOB, headache, sore throat, rhinorrhea since Wednesday night.  Pt has taken Tylenol for her symptoms.  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  Pt did get a flu shot this year.  Past Medical History:  Diagnosis Date  . Allergy   . Arthritis   . Asthma   . Chicken pox   . Diabetes mellitus   . Finger infection 2008   isolation  . GERD (gastroesophageal reflux disease)   . Heart murmur    benign per patient  . Hyperlipidemia   . Hypertension    past hx   . Ischemic optic neuropathy    right eye  . Migraine   . OSA (obstructive sleep apnea) 10/25/2016  . Osteoporosis   . Pericarditis   . Right sided sciatica 06/13/2017  . Sleep apnea    Patient is scheduled for sleep study tonight- wears cpap  . Viral meningitis     Allergies  Allergen Reactions  . Food     Strawberries, mangoes, pineapple, eggplant, zucchini, macadamia nuts   . Penicillins Hives  . Zithromax [Azithromycin] Hives    z-max; states can take Z pack    ROS General: Denies fever, chills, night sweats, changes in weight, changes in appetite  + body aches HEENT: Denies ear pain, changes in vision + ear pressure, headache, rhinorrhea, sore throat CV: Denies CP, palpitations, SOB, orthopnea Pulm: Denies wheezing  +cough, SOB GI: Denies abdominal pain, nausea, vomiting, diarrhea, constipation GU: Denies dysuria, hematuria, frequency, vaginal discharge Msk: Denies muscle cramps, joint pains Neuro: Denies weakness, numbness, tingling Skin: Denies rashes, bruising Psych: Denies depression, anxiety, hallucinations    Objective:    Blood pressure 108/64, pulse 90, temperature 98.8 F (37.1 C), temperature source Oral, weight 178 lb (80.7 kg),  SpO2 97 %.  Gen. Pleasant, well-nourished, in no distress, normal affect HEENT: [place]/AT, face symmetric, no scleral icterus, PERRLA, nares patent without drainage, pharynx with erythema, mild exudate. Lungs: no accessory muscle use, CTAB, no wheezes or rales Cardiovascular: RRR, no m/r/g, no peripheral edema Abdomen: BS present, soft, NT/ND, no hepatosplenomegaly. Neuro:  A&Ox3, CN II-XII intact, normal gait  Wt Readings from Last 3 Encounters:  02/15/18 177 lb 3.2 oz (80.4 kg)  02/14/18 176 lb 3.2 oz (79.9 kg)  01/25/18 177 lb (80.3 kg)    Lab Results  Component Value Date   WBC 8.9 03/24/2017   HGB 13.2 03/24/2017   HCT 39.4 03/24/2017   PLT 435.0 (H) 03/24/2017   GLUCOSE 140 (H) 03/24/2017   CHOL 123 03/24/2017   TRIG 152.0 (H) 03/24/2017   HDL 46.90 03/24/2017   LDLCALC 45 03/24/2017   ALT 20 03/24/2017   AST 12 03/24/2017   NA 141 03/24/2017   K 3.7 03/24/2017   CL 104 03/24/2017   CREATININE 0.64 03/24/2017   BUN 10 03/24/2017   CO2 29 03/24/2017   TSH 1.02 03/24/2017   HGBA1C 6.8 (A) 02/14/2018   MICROALBUR 1.1 03/24/2017    Assessment/Plan:  Influenza A  -Rapid flu positive -Discussed supportive care for symptoms -Given Tamiflu as symptoms are still within 48-hour window. -Given RTC or ED precautions -Given a note for work  Sore throat -likely 2/2  influenza -rapid strep obtained as small area of possible exudate seen on exam.  Test was negative -rapid flu obtained--positive  F/u prn  Grier Mitts, MD

## 2018-04-21 NOTE — Patient Instructions (Signed)

## 2018-04-21 NOTE — Addendum Note (Signed)
Addended by: Wyvonne Lenz on: 04/21/2018 11:30 AM   Modules accepted: Orders

## 2018-05-19 DIAGNOSIS — Z881 Allergy status to other antibiotic agents status: Secondary | ICD-10-CM | POA: Diagnosis not present

## 2018-05-19 DIAGNOSIS — E1169 Type 2 diabetes mellitus with other specified complication: Secondary | ICD-10-CM | POA: Diagnosis not present

## 2018-05-19 DIAGNOSIS — R21 Rash and other nonspecific skin eruption: Secondary | ICD-10-CM | POA: Diagnosis not present

## 2018-05-19 DIAGNOSIS — Z880 Allergy status to penicillin: Secondary | ICD-10-CM | POA: Diagnosis not present

## 2018-05-19 DIAGNOSIS — L509 Urticaria, unspecified: Secondary | ICD-10-CM | POA: Diagnosis not present

## 2018-05-19 DIAGNOSIS — Z79899 Other long term (current) drug therapy: Secondary | ICD-10-CM | POA: Diagnosis not present

## 2018-05-19 DIAGNOSIS — J45909 Unspecified asthma, uncomplicated: Secondary | ICD-10-CM | POA: Diagnosis not present

## 2018-05-19 DIAGNOSIS — Z7951 Long term (current) use of inhaled steroids: Secondary | ICD-10-CM | POA: Diagnosis not present

## 2018-05-19 DIAGNOSIS — Z7982 Long term (current) use of aspirin: Secondary | ICD-10-CM | POA: Diagnosis not present

## 2018-05-19 DIAGNOSIS — E785 Hyperlipidemia, unspecified: Secondary | ICD-10-CM | POA: Diagnosis not present

## 2018-05-19 DIAGNOSIS — Z794 Long term (current) use of insulin: Secondary | ICD-10-CM | POA: Diagnosis not present

## 2018-05-23 ENCOUNTER — Encounter: Payer: Self-pay | Admitting: Internal Medicine

## 2018-05-23 ENCOUNTER — Ambulatory Visit: Payer: BLUE CROSS/BLUE SHIELD | Admitting: Internal Medicine

## 2018-05-23 VITALS — BP 120/80 | HR 73 | Temp 98.0°F | Wt 175.1 lb

## 2018-05-23 DIAGNOSIS — J4540 Moderate persistent asthma, uncomplicated: Secondary | ICD-10-CM | POA: Diagnosis not present

## 2018-05-23 DIAGNOSIS — Z23 Encounter for immunization: Secondary | ICD-10-CM | POA: Diagnosis not present

## 2018-05-23 DIAGNOSIS — E7800 Pure hypercholesterolemia, unspecified: Secondary | ICD-10-CM | POA: Diagnosis not present

## 2018-05-23 DIAGNOSIS — E103293 Type 1 diabetes mellitus with mild nonproliferative diabetic retinopathy without macular edema, bilateral: Secondary | ICD-10-CM | POA: Diagnosis not present

## 2018-05-23 DIAGNOSIS — Z0000 Encounter for general adult medical examination without abnormal findings: Secondary | ICD-10-CM

## 2018-05-23 DIAGNOSIS — Z794 Long term (current) use of insulin: Secondary | ICD-10-CM | POA: Diagnosis not present

## 2018-05-23 DIAGNOSIS — J9801 Acute bronchospasm: Secondary | ICD-10-CM

## 2018-05-23 DIAGNOSIS — E559 Vitamin D deficiency, unspecified: Secondary | ICD-10-CM | POA: Insufficient documentation

## 2018-05-23 DIAGNOSIS — E1139 Type 2 diabetes mellitus with other diabetic ophthalmic complication: Secondary | ICD-10-CM

## 2018-05-23 LAB — VITAMIN D 25 HYDROXY (VIT D DEFICIENCY, FRACTURES): VITD: 4.78 ng/mL — ABNORMAL LOW (ref 30.00–100.00)

## 2018-05-23 LAB — CBC WITH DIFFERENTIAL/PLATELET
Basophils Absolute: 0.1 10*3/uL (ref 0.0–0.1)
Basophils Relative: 1 % (ref 0.0–3.0)
Eosinophils Absolute: 0 10*3/uL (ref 0.0–0.7)
Eosinophils Relative: 0.2 % (ref 0.0–5.0)
HCT: 40.7 % (ref 36.0–46.0)
Hemoglobin: 13.6 g/dL (ref 12.0–15.0)
Lymphocytes Relative: 26.9 % (ref 12.0–46.0)
Lymphs Abs: 2.7 10*3/uL (ref 0.7–4.0)
MCHC: 33.5 g/dL (ref 30.0–36.0)
MCV: 83.9 fl (ref 78.0–100.0)
Monocytes Absolute: 0.6 10*3/uL (ref 0.1–1.0)
Monocytes Relative: 6.3 % (ref 3.0–12.0)
Neutro Abs: 6.7 10*3/uL (ref 1.4–7.7)
Neutrophils Relative %: 65.6 % (ref 43.0–77.0)
Platelets: 428 10*3/uL — ABNORMAL HIGH (ref 150.0–400.0)
RBC: 4.85 Mil/uL (ref 3.87–5.11)
RDW: 13.7 % (ref 11.5–15.5)
WBC: 10.2 10*3/uL (ref 4.0–10.5)

## 2018-05-23 LAB — COMPREHENSIVE METABOLIC PANEL
ALT: 18 U/L (ref 0–35)
AST: 10 U/L (ref 0–37)
Albumin: 4 g/dL (ref 3.5–5.2)
Alkaline Phosphatase: 93 U/L (ref 39–117)
BUN: 14 mg/dL (ref 6–23)
CO2: 28 mEq/L (ref 19–32)
Calcium: 9.2 mg/dL (ref 8.4–10.5)
Chloride: 103 mEq/L (ref 96–112)
Creatinine, Ser: 0.55 mg/dL (ref 0.40–1.20)
GFR: 120.42 mL/min (ref >60.00)
Glucose, Bld: 202 mg/dL — ABNORMAL HIGH (ref 70–99)
Potassium: 4 mEq/L (ref 3.5–5.1)
Sodium: 140 mEq/L (ref 135–145)
Total Bilirubin: 0.8 mg/dL (ref 0.2–1.2)
Total Protein: 7.2 g/dL (ref 6.0–8.3)

## 2018-05-23 LAB — HEMOGLOBIN A1C: Hgb A1c MFr Bld: 7.9 % — ABNORMAL HIGH (ref 4.6–6.5)

## 2018-05-23 LAB — MICROALBUMIN / CREATININE URINE RATIO
Creatinine,U: 104.3 mg/dL
Microalb Creat Ratio: 1.7 mg/g (ref 0.0–30.0)
Microalb, Ur: 1.8 mg/dL (ref 0.0–1.9)

## 2018-05-23 LAB — LIPID PANEL
Cholesterol: 166 mg/dL (ref 0–200)
HDL: 58.2 mg/dL (ref >39.00)
LDL Cholesterol: 81 mg/dL (ref 0–99)
NonHDL: 108.07
Total CHOL/HDL Ratio: 3
Triglycerides: 134 mg/dL (ref 0.0–149.0)
VLDL: 26.8 mg/dL (ref 0.0–40.0)

## 2018-05-23 LAB — VITAMIN B12: Vitamin B-12: 438 pg/mL (ref 211–911)

## 2018-05-23 LAB — TSH: TSH: 0.41 u[IU]/mL (ref 0.35–4.50)

## 2018-05-23 MED ORDER — TRAMADOL HCL 50 MG PO TABS: 100.0000 mg | ORAL_TABLET | Freq: Four times a day (QID) | ORAL | 0 refills | Status: DC | PRN

## 2018-05-23 MED ORDER — FLUTICASONE-SALMETEROL 250-50 MCG/DOSE IN AEPB: 1.0000 | INHALATION_SPRAY | Freq: Two times a day (BID) | RESPIRATORY_TRACT | 5 refills | Status: AC

## 2018-05-23 MED ORDER — ALBUTEROL SULFATE HFA 108 (90 BASE) MCG/ACT IN AERS: 2.0000 | INHALATION_SPRAY | RESPIRATORY_TRACT | 3 refills | Status: AC | PRN

## 2018-05-23 MED ORDER — VITAMIN D (ERGOCALCIFEROL) 1.25 MG (50000 UNIT) PO CAPS: 50000.0000 [IU] | ORAL_CAPSULE | ORAL | 0 refills | Status: DC

## 2018-05-23 MED ORDER — NAPROXEN 500 MG PO TABS: 500.0000 mg | ORAL_TABLET | Freq: Two times a day (BID) | ORAL | 1 refills | Status: DC

## 2018-05-23 MED ORDER — MONTELUKAST SODIUM 10 MG PO TABS: 10.0000 mg | ORAL_TABLET | Freq: Every day | ORAL | 1 refills | Status: DC

## 2018-05-23 NOTE — Addendum Note (Signed)
Addended by: Westley Hummer B on: 05/23/2018 01:25 PM   Modules accepted: Orders

## 2018-05-23 NOTE — Patient Instructions (Signed)
-  It was nice meeting you today!  -Lab work today, we will call you with results.  -You will receive the first out of 2 shingles vaccinations today.  -PHQ2-9 today (depression screening)  -Please schedule follow-up in 4 to 6 months for chronic medical issues.

## 2018-05-23 NOTE — Addendum Note (Signed)
Addended by: Lelon Frohlich Y on: 05/23/2018 12:55 PM   Modules accepted: Orders

## 2018-05-23 NOTE — Progress Notes (Addendum)
Established Patient Office Visit     CC/Reason for Visit: Establish care, medication refills, annual physical exam  HPI: Renee Lam is a 57 y.o. female who is coming in today for the above mentioned reasons. 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  Since last seen in the office she had to go to the emergency department with hives, source is unclear.  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  They gave her a prednisone taper which she is still completing.  She also was diagnosed with the flu in November and has had a difficult time using her CPAP machine since.  No acute complaints today.  She has routine eye/dental care.  Health Maintenance:  -PAP: Follows with GYN for this -Mammo: Patient aware she needs to schedule -Colon Cancer: Next due 2024 -Lung Ca (ages 62-80 with 81 pack-yr history or quit past 15 yrs): No -DEXA: Not at age -Vaccines: Tdap (due 2026), PCV13 (not at age), PPSV23 (not at age), [place] (will receive first today), MMR if born after 1957 if titers negative (no) -HIV: Antibody today -HIV PrEP if high risk: No -BP: Well-controlled -Depression: PHQ2-9 today for screening purposes -DM: Followed by endocrinology -Hep C (adults born between [birth year]-1965): Antibody today -Tobacco cessation: Not a smoker -Healthy Diet and Exercise: Discussed mediterranean diet and at least 30 minutes of aerobic exercise daily.    Past Medical/Surgical History: Past Medical History:  Diagnosis Date  . Allergy   . Arthritis   . Asthma   . Chicken pox   . Diabetes mellitus   . Finger infection 2008   isolation  . GERD (gastroesophageal reflux disease)   . Heart murmur    benign per patient  . Hyperlipidemia   . Hypertension    past hx   . Ischemic optic neuropathy    right eye  . Migraine   . OSA (obstructive sleep apnea) 10/25/2016  . Osteoporosis   . Pericarditis   . Right sided sciatica 06/13/2017  . Sleep apnea    Patient is scheduled for sleep study tonight- wears cpap  . Viral meningitis     Past Surgical History:  Procedure Laterality Date  . CARDIAC CATHETERIZATION  2012  . CESAREAN SECTION     1997, 2000  . COLONOSCOPY    . KNEE ARTHROSCOPY Right 2010  . MENISCUS REPAIR Left 10/2017  . ORIF HUMERUS FRACTURE Right 03/25/2015   Procedure: OPEN REDUCTION INTERNAL FIXATION (ORIF) DISTAL HUMERUS FRACTURE;  Surgeon: Renette Butters, MD;  Location: [facility];  Service: Orthopedics;  Laterality: Right;  . POLYPECTOMY      Social History:  reports that she has never smoked. She has never used smokeless tobacco. She reports that she drinks alcohol. She reports that she does not use drugs.  Allergies: Allergies  Allergen Reactions  . Food     Strawberries, mangoes, pineapple, eggplant, zucchini, macadamia nuts   . Penicillins Hives    Has patient had a PCN reaction causing immediate rash, facial/tongue/throat swelling, SOB or lightheadedness with hypotension: NoNo Has patient had a PCN reaction causing severe rash involving mucus membranes or skin necrosis: NoNo Has patient had a PCN reaction that required hospitalization NoNo Has patient had a PCN reaction occurring within the  last 10 years: NoNo If all of the above answers are "NO", then may proceed with Cephalosporin   . Zithromax [Azithromycin] Hives    z-max; states can take Z pack    Family History:  Family History  Problem Relation Age of Onset  . Colon polyps Mother   . Diabetes Mother   . Heart failure Mother   . Peripheral vascular disease Mother   . Diabetes Father   . Heart disease Father   . Arthritis Father   . Hypertension Father   . Heart failure  Father   . Prostate cancer Father   . Arthritis Other   . Hyperlipidemia Other   . Heart disease Other   . Hypertension Other   . Depression Other   . Ovarian cancer Other   . Uterine cancer Other   . Breast cancer Other   . Prostate cancer Other   . Breast cancer Cousin   . Colon cancer Neg Hx   . Stomach cancer Neg Hx   . Rectal cancer Neg Hx   . Pancreatic cancer Neg Hx   . Esophageal cancer Neg Hx      Current Outpatient Medications:  .  albuterol (PROAIR HFA) 108 (90 Base) MCG/ACT inhaler, Inhale 2 puffs into the lungs as needed., Disp: 18 g, Rfl: 3 .  aspirin EC 81 MG tablet, Take 81 mg by mouth daily., Disp: , Rfl:  .  Aspirin-Acetaminophen-Caffeine (EXCEDRIN EXTRA STRENGTH PO), Take by mouth as needed., Disp: , Rfl:  .  atorvastatin (LIPITOR) 80 MG tablet, TAKE 1 TABLET BY MOUTH EVERYDAY AT BEDTIME, Disp: 90 tablet, Rfl: 4 .  BAYER CONTOUR NEXT TEST test strip, USE TO TEST FOUR TIMES A DAY AS INSTRUCTED., Disp: , Rfl: 12 .  CONTOUR NEXT TEST test strip, USE TO TEST 4 TIMES A DAY AS INSTRUCTED, Disp: 300 each, Rfl: 1 .  diphenhydrAMINE (BENADRYL) 25 MG tablet, Take 25 mg by mouth every 6 (six) hours as needed for allergies. , Disp: , Rfl:  .  EPINEPHRINE 0.3 mg/0.3 mL IJ SOAJ injection, INJECT 0.3 ML INTO THE MUSCLE ONCE, Disp: 2 mL, Rfl: 0 .  Fluticasone-Salmeterol (ADVAIR DISKUS) 250-50 MCG/DOSE AEPB, Inhale 1 puff into the lungs 2 (two) times daily., Disp: 60 each, Rfl: 5 .  Insulin Glargine (LANTUS SOLOSTAR) 100 UNIT/ML Solostar Pen, Inject 150 Units into the skin every morning., Disp: 60 mL, Rfl: 3 .  Insulin Pen Needle (BD PEN NEEDLE NANO U/F) 32G X 4 MM MISC, USE AS DIRECTED 4 TIMES A DAY, Disp: 390 each, Rfl: 3 .  montelukast (SINGULAIR) 10 MG tablet, Take 1 tablet (10 mg total) by mouth at bedtime., Disp: 90 tablet, Rfl: 1 .  naproxen (NAPROSYN) 500 MG tablet, Take 1 tablet (500 mg total) by mouth 2 (two) times daily., Disp: 60 tablet, Rfl: 1 .  predniSONE  (DELTASONE) 10 MG tablet, 4 tabs daily x2 days, 3 tabs daily x2 days, 2 tabs daily x2 days, 1 tablet daily x2 days, Disp: , Rfl:  .  traMADol (ULTRAM) 50 MG tablet, Take 2 tablets (100 mg total) by mouth every 6 (six) hours as needed for moderate pain., Disp: 60 tablet, Rfl: 0 .  VICTOZA 18 MG/3ML SOPN, INJECT 0.3 MLS (1.8 MG TOTAL) INTO THE SKIN DAILY., Disp: 27 pen, Rfl: 0  Review of Systems:  Constitutional: Denies fever, chills, diaphoresis, appetite change and fatigue.  HEENT: Denies photophobia, eye pain, redness, hearing loss, ear pain, congestion, sore throat, rhinorrhea, sneezing, mouth sores,  trouble swallowing, neck pain, neck stiffness and tinnitus.   Respiratory: Denies SOB, DOE, cough, chest tightness,  and wheezing.   Cardiovascular: Denies chest pain, palpitations and leg swelling.  Gastrointestinal: Denies nausea, vomiting, abdominal pain, diarrhea, constipation, blood in stool and abdominal distention.  Genitourinary: Denies dysuria, urgency, frequency, hematuria, flank pain and difficulty urinating.  Endocrine: Denies: hot or cold intolerance, sweats, changes in hair or nails, polyuria, polydipsia. Musculoskeletal: Denies myalgias, back pain, joint swelling, arthralgias and gait problem.  Skin: Denies pallor, rash and wound.  Neurological: Denies dizziness, seizures, syncope, weakness, light-headedness, numbness and headaches.  Hematological: Denies adenopathy. Easy bruising, personal or family bleeding history  Psychiatric/Behavioral: Denies suicidal ideation, mood changes, confusion, nervousness, sleep disturbance and agitation    Physical Exam: Vitals:   05/23/18 0848  BP: 120/80  Pulse: 73  Temp: 98 F (36.7 C)  TempSrc: Oral  SpO2: 96%  Weight: 175 lb 1.6 oz (79.4 kg)    Body mass index is 36.6 kg/m.   Constitutional: NAD, calm, comfortable Eyes: PERRL, lids and conjunctivae normal ENMT: Mucous membranes are moist. Posterior pharynx clear of any exudate  or lesions. Normal dentition. Tympanic membrane is pearly white, no erythema or bulging. Neck: normal, supple, no masses, no thyromegaly Respiratory: clear to auscultation bilaterally, no wheezing, no crackles. Normal respiratory effort. No accessory muscle use.  Cardiovascular: Regular rate and rhythm, no murmurs / rubs / gallops. No extremity edema. 2+ pedal pulses. No carotid bruits.  Abdomen: no tenderness, no masses palpated. No hepatosplenomegaly. Bowel sounds positive.  Musculoskeletal: no clubbing / cyanosis. No joint deformity upper and lower extremities. Good ROM, no contractures. Normal muscle tone.  Skin: no rashes, lesions, ulcers. No induration Neurologic: CN 2-12 grossly intact. Sensation intact, DTR normal. Strength 5/5 in all 4.  Psychiatric: Normal judgment and insight. Alert and oriented x 3. Normal mood.    Impression and Plan:  Hypercholesterolemia -We will atorvastatin. -Last LDL 45 in October 2018, recheck lipid panel today as part of annual wellness visit.  Moderate persistent asthma without complication  Bronchospasm  -Well-controlled on Advair plus as needed albuterol.  Does not use albuterol frequently, only uses Advair during allergy season.  Mild nonproliferative diabetic retinopathy of both eyes  -Follows routinely with ophthalmology last seen in September.  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 -Follows closely with endocrinology, Dr. Ebony Hail.  Wellness examination - Plan: HIV antibody (with reflex), Hep C Antibody, VITAMIN D 25 Hydroxy (Vit-D Deficiency, Fractures) -We will obtain full panel of lab work today. -Depression screening -First in series of shingles vaccination today  Morbid obesity ([place]), Chronic -Discussed healthy lifestyle including better food choices and increased physical activity.    Patient Instructions  -It was  nice meeting you today!  -Lab work today, we will call you with results.  -You will receive the first out of 2 shingles vaccinations today.  -PHQ2-9 today (depression screening)  -Please schedule follow-up in 4 to 6 months for chronic medical issues.     Lelon Frohlich, MD [facility]

## 2018-05-24 ENCOUNTER — Other Ambulatory Visit: Payer: Self-pay | Admitting: Internal Medicine

## 2018-05-24 DIAGNOSIS — E559 Vitamin D deficiency, unspecified: Secondary | ICD-10-CM

## 2018-05-24 LAB — HEPATITIS C ANTIBODY
Hepatitis C Ab: NONREACTIVE
SIGNAL TO CUT-OFF: 0.04 (ref <1.00)

## 2018-05-24 LAB — HIV ANTIBODY (ROUTINE TESTING W REFLEX): HIV 1&2 Ab, 4th Generation: NONREACTIVE

## 2018-05-25 ENCOUNTER — Telehealth: Payer: Self-pay | Admitting: Endocrinology

## 2018-05-25 NOTE — Telephone Encounter (Signed)
Please advise 

## 2018-05-25 NOTE — Telephone Encounter (Signed)
OK 

## 2018-05-25 NOTE — Telephone Encounter (Signed)
Patient has called in regards to Labs at her primary being A1C was 7.9. Patient has requested to increase Insulin Glargine (LANTUS SOLOSTAR) 100 UNIT/ML Solostar Pen

## 2018-05-26 NOTE — Telephone Encounter (Signed)
160 units each morning

## 2018-05-26 NOTE — Telephone Encounter (Signed)
Based on her A1C results, what will be the new dosage for her insulin?

## 2018-05-26 NOTE — Telephone Encounter (Signed)
Spoke to pt concerning increased dosage and she stated that she understood

## 2018-05-26 NOTE — Telephone Encounter (Signed)
Patient is returning call.  °

## 2018-05-26 NOTE — Telephone Encounter (Signed)
Called pt to inform of new dosage. Also wanted to inquire if she needs a refill.

## 2018-06-19 ENCOUNTER — Ambulatory Visit: Payer: BLUE CROSS/BLUE SHIELD | Admitting: Endocrinology

## 2018-08-04 DIAGNOSIS — H47011 Ischemic optic neuropathy, right eye: Secondary | ICD-10-CM | POA: Diagnosis not present

## 2018-08-04 DIAGNOSIS — H25813 Combined forms of age-related cataract, bilateral: Secondary | ICD-10-CM | POA: Diagnosis not present

## 2018-08-04 DIAGNOSIS — H2513 Age-related nuclear cataract, bilateral: Secondary | ICD-10-CM | POA: Diagnosis not present

## 2018-08-04 DIAGNOSIS — H43813 Vitreous degeneration, bilateral: Secondary | ICD-10-CM | POA: Diagnosis not present

## 2018-08-09 ENCOUNTER — Ambulatory Visit (INDEPENDENT_AMBULATORY_CARE_PROVIDER_SITE_OTHER): Payer: BLUE CROSS/BLUE SHIELD | Admitting: Internal Medicine

## 2018-08-09 ENCOUNTER — Encounter: Payer: Self-pay | Admitting: Internal Medicine

## 2018-08-09 VITALS — BP 110/78 | HR 95 | Temp 98.3°F | Wt 177.9 lb

## 2018-08-09 DIAGNOSIS — H9312 Tinnitus, left ear: Secondary | ICD-10-CM | POA: Diagnosis not present

## 2018-08-09 DIAGNOSIS — E559 Vitamin D deficiency, unspecified: Secondary | ICD-10-CM | POA: Diagnosis not present

## 2018-08-09 DIAGNOSIS — Z23 Encounter for immunization: Secondary | ICD-10-CM | POA: Diagnosis not present

## 2018-08-09 NOTE — Progress Notes (Signed)
Established Patient Office Visit     CC/Reason for Visit: Left ear "sound", rash upper left chest, follow-up vitamin D deficiency  HPI: Renee Lam is a 58 y.o. female who is coming in today for the above mentioned reasons.  She was diagnosed with vitamin D deficiency in December, was prescribed 12 weeks of vitamin D 50,000 units weekly and is supposed to have follow-up vitamin D levels at that point to determine future vitamin D prescription.  She still has not completed her 12 weeks.  Her cat scratched her in the chest a few months ago since then she states she will occasionally have a rash that is itchy.  No rash is evident on exam today.  Since approximately November she has had the left ear sound, she says it is not a ringing but more like a motorboat sound.  She has not had a recent URI, does not have dizziness or gait imbalance.   Past Medical/Surgical History: Past Medical History:  Diagnosis Date  . Allergy   . Arthritis   . Asthma   . Chicken pox   . Diabetes mellitus   . Finger infection 2008   isolation  . GERD (gastroesophageal reflux disease)   . Heart murmur    benign per patient  . Hyperlipidemia   . Hypertension    past hx   . Ischemic optic neuropathy    right eye  . Migraine   . OSA (obstructive sleep apnea) 10/25/2016  . Osteoporosis   . Pericarditis   . Right sided sciatica 06/13/2017  . Sleep apnea    Patient is scheduled for sleep study tonight- wears cpap  . Viral meningitis     Past Surgical History:  Procedure Laterality Date  . CARDIAC CATHETERIZATION  2012  . CESAREAN SECTION     1997, 2000  . COLONOSCOPY    . KNEE ARTHROSCOPY Right 2010  . MENISCUS REPAIR Left 10/2017  . ORIF HUMERUS FRACTURE Right 03/25/2015   Procedure: OPEN REDUCTION INTERNAL FIXATION (ORIF) DISTAL HUMERUS FRACTURE;  Surgeon: Renette Butters, MD;  Location: [facility];  Service: Orthopedics;  Laterality: Right;  . POLYPECTOMY       Social History:  reports that she has never smoked. She has never used smokeless tobacco. She reports current alcohol use. She reports that she does not use drugs.  Allergies: Allergies  Allergen Reactions  . Food     Strawberries, mangoes, pineapple, eggplant, zucchini, macadamia nuts   . Penicillins Hives       . Zithromax [Azithromycin] Hives    z-max; states can take Z pack    Family History:  Family History  Problem Relation Age of Onset  . Colon polyps Mother   . Diabetes Mother   . Heart failure Mother   . Peripheral vascular disease Mother   . Diabetes Father   . Heart disease Father   . Arthritis Father   . Hypertension Father   . Heart failure Father   . Prostate cancer Father   . Arthritis Other   . Hyperlipidemia Other   . Heart disease Other   . Hypertension Other   . Depression Other   . Ovarian cancer Other   . Uterine cancer Other   . Breast cancer Other   . Prostate cancer Other   . Breast cancer Cousin   . Colon cancer Neg Hx   . Stomach cancer Neg Hx   . Rectal cancer Neg Hx   .  Pancreatic cancer Neg Hx   . Esophageal cancer Neg Hx      Current Outpatient Medications:  .  albuterol (PROAIR HFA) 108 (90 Base) MCG/ACT inhaler, Inhale 2 puffs into the lungs as needed., Disp: 18 g, Rfl: 3 .  aspirin EC 81 MG tablet, Take 81 mg by mouth daily., Disp: , Rfl:  .  Aspirin-Acetaminophen-Caffeine (EXCEDRIN EXTRA STRENGTH PO), Take by mouth as needed., Disp: , Rfl:  .  atorvastatin (LIPITOR) 80 MG tablet, TAKE 1 TABLET BY MOUTH EVERYDAY AT BEDTIME, Disp: 90 tablet, Rfl: 4 .  BAYER CONTOUR NEXT TEST test strip, USE TO TEST FOUR TIMES A DAY AS INSTRUCTED., Disp: , Rfl: 12 .  CONTOUR NEXT TEST test strip, USE TO TEST 4 TIMES A DAY AS INSTRUCTED, Disp: 300 each, Rfl: 1 .  diphenhydrAMINE (BENADRYL) 25 MG tablet, Take 25 mg by mouth every 6 (six) hours as needed for allergies. , Disp: , Rfl:  .  EPINEPHRINE 0.3 mg/0.3 mL IJ SOAJ injection, INJECT 0.3 ML  INTO THE MUSCLE ONCE, Disp: 2 mL, Rfl: 0 .  Fluticasone-Salmeterol (ADVAIR DISKUS) 250-50 MCG/DOSE AEPB, Inhale 1 puff into the lungs 2 (two) times daily., Disp: 60 each, Rfl: 5 .  Insulin Glargine (LANTUS SOLOSTAR) 100 UNIT/ML Solostar Pen, Inject 150 Units into the skin every morning., Disp: 60 mL, Rfl: 3 .  Insulin Pen Needle (BD PEN NEEDLE NANO U/F) 32G X 4 MM MISC, USE AS DIRECTED 4 TIMES A DAY, Disp: 390 each, Rfl: 3 .  montelukast (SINGULAIR) 10 MG tablet, Take 1 tablet (10 mg total) by mouth at bedtime., Disp: 90 tablet, Rfl: 1 .  naproxen (NAPROSYN) 500 MG tablet, Take 1 tablet (500 mg total) by mouth 2 (two) times daily., Disp: 60 tablet, Rfl: 1 .  traMADol (ULTRAM) 50 MG tablet, Take 2 tablets (100 mg total) by mouth every 6 (six) hours as needed for moderate pain., Disp: 60 tablet, Rfl: 0 .  VICTOZA 18 MG/3ML SOPN, INJECT 0.3 MLS (1.8 MG TOTAL) INTO THE SKIN DAILY., Disp: 27 pen, Rfl: 0 .  Vitamin D, Ergocalciferol, (DRISDOL) 1.25 MG (50000 UT) CAPS capsule, Take 1 capsule (50,000 Units total) by mouth every 7 (seven) days., Disp: 12 capsule, Rfl: 0  Review of Systems:  Constitutional: Denies fever, chills, diaphoresis, appetite change and fatigue.  HEENT: Denies photophobia, eye pain, redness, hearing loss, ear pain, congestion, sore throat, rhinorrhea, sneezing, mouth sores, trouble swallowing, neck pain, neck stiffness and tinnitus.   Respiratory: Denies SOB, DOE, cough, chest tightness,  and wheezing.   Cardiovascular: Denies chest pain, palpitations and leg swelling.  Gastrointestinal: Denies nausea, vomiting, abdominal pain, diarrhea, constipation, blood in stool and abdominal distention.  Genitourinary: Denies dysuria, urgency, frequency, hematuria, flank pain and difficulty urinating.  Endocrine: Denies: hot or cold intolerance, sweats, changes in hair or nails, polyuria, polydipsia. Musculoskeletal: Denies myalgias, back pain, joint swelling, arthralgias and gait problem.   Skin: Denies pallor, rash and wound.  Neurological: Denies dizziness, seizures, syncope, weakness, light-headedness, numbness and headaches.  Hematological: Denies adenopathy. Easy bruising, personal or family bleeding history  Psychiatric/Behavioral: Denies suicidal ideation, mood changes, confusion, nervousness, sleep disturbance and agitation    Physical Exam: Vitals:   08/09/18 1625  BP: 110/78  Pulse: 95  Temp: 98.3 F (36.8 C)  TempSrc: Oral  SpO2: 97%  Weight: 177 lb 14.4 oz (80.7 kg)    Body mass index is 37.18 kg/m.   Constitutional: NAD, calm, comfortable Eyes: PERRL, lids and conjunctivae normal ENMT: Mucous  membranes are moist. Posterior pharynx clear of any exudate or lesions. Normal dentition. Tympanic membrane is pearly white, no erythema or bulging. Neck: normal, supple, no masses, no thyromegaly Respiratory: clear to auscultation bilaterally, no wheezing, no crackles. Normal respiratory effort. No accessory muscle use.  Cardiovascular: Regular rate and rhythm, no murmurs / rubs / gallops. No extremity edema. 2+ pedal pulses. No carotid bruits.   Skin: no rashes, lesions, ulcers. No induration Psychiatric: Normal judgment and insight. Alert and oriented x 3. Normal mood.    Impression and Plan:  Ringing in left ear -This has been present for about 3 months, no recent URI. -No abnormalities on ear exam today. -Per patient preference, will refer to ENT for further evaluation.  Need for shingles vaccine - Plan: Varicella-zoster vaccine IM (Shingrix)  Vitamin D deficiency -Still has 3 weeks left, will come back for vitamin D levels to be rechecked at that time, order is already placed. -Further recommendations for vitamin D supplementation pending these results.    Patient Instructions  -Nice seeing you today!  -Will refer you to ENT.  -Second shingles today.     Lelon Frohlich, MD [facility]

## 2018-08-09 NOTE — Patient Instructions (Signed)
-  Nice seeing you today!  -Will refer you to ENT.  -Second shingles today.

## 2018-08-15 ENCOUNTER — Telehealth: Payer: Self-pay | Admitting: Endocrinology

## 2018-08-15 NOTE — Telephone Encounter (Signed)
Patient went to pick Victoza but it was $800.  Patient cannot afford Victoza. Patient requests to be called at ph# [phone] to advise.

## 2018-08-15 NOTE — Telephone Encounter (Signed)
Called pt to make her aware of options and to inquire further about deductible. Denies not having met deductible which could be contributing to high cost. States she has not had any issues with any of her medications, only this one. States she will do her research and will call back to let us know how she wishes to proceed. 

## 2018-08-15 NOTE — Telephone Encounter (Signed)
Is there an affordable alternative to Victoza?

## 2018-08-15 NOTE — Telephone Encounter (Signed)
Alternatives are trulicity and ozempic.  No problem changing.  We have discount cards for both.  However, is the problem the med, or is it deductible.

## 2018-10-25 ENCOUNTER — Other Ambulatory Visit: Payer: Self-pay

## 2018-10-27 ENCOUNTER — Encounter: Payer: Self-pay | Admitting: Endocrinology

## 2018-10-27 ENCOUNTER — Other Ambulatory Visit: Payer: Self-pay

## 2018-10-27 ENCOUNTER — Ambulatory Visit: Payer: BLUE CROSS/BLUE SHIELD | Admitting: Endocrinology

## 2018-10-27 VITALS — BP 130/80 | HR 86 | Ht <= 58 in | Wt 182.8 lb

## 2018-10-27 DIAGNOSIS — E119 Type 2 diabetes mellitus without complications: Secondary | ICD-10-CM

## 2018-10-27 LAB — POCT GLYCOSYLATED HEMOGLOBIN (HGB A1C): Hemoglobin A1C: 8.9 % — AB (ref 4.0–5.6)

## 2018-10-27 MED ORDER — INSULIN GLARGINE 100 UNIT/ML SOLOSTAR PEN: 160.0000 [IU] | PEN_INJECTOR | SUBCUTANEOUS | 3 refills | Status: DC

## 2018-10-27 NOTE — Progress Notes (Signed)
Subjective:    Patient ID: Renee Lam, female    DOB: 1960-10-10, 58 y.o.   MRN: [id number]  HPI Pt returns for f/u of diabetes mellitus: DM type: Insulin-requiring type 2.  Dx'ed: 5427 Complications: DR.  Therapy: insulin since 2010, and victoza.  GDM: 1996 and 1999.  DKA: never.  Severe hypoglycemia: never.     Pancreatitis: never.   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   Interval history: She stopped victoza, due to cost.  no cbg record, but states cbg's vary from 74-303.  There is no trend throughout the day.  Past Medical History:  Diagnosis Date  . Allergy   . Arthritis   . Asthma   . Chicken pox   . Diabetes mellitus   . Finger infection 2008   isolation  . GERD (gastroesophageal reflux disease)   . Heart murmur    benign per patient  . Hyperlipidemia   . Hypertension    past hx   . Ischemic optic neuropathy    right eye  . Migraine   . OSA (obstructive sleep apnea) 10/25/2016  . Osteoporosis   . Pericarditis   . Right sided sciatica 06/13/2017  . Sleep apnea    Patient is scheduled for sleep study tonight- wears cpap  . Viral meningitis     Past Surgical History:  Procedure Laterality Date  . CARDIAC CATHETERIZATION  2012  . CESAREAN SECTION     1997, 2000  . COLONOSCOPY    . KNEE ARTHROSCOPY Right 2010  . MENISCUS REPAIR Left 10/2017  . ORIF HUMERUS FRACTURE Right 03/25/2015   Procedure: OPEN REDUCTION INTERNAL FIXATION (ORIF) DISTAL HUMERUS FRACTURE;  Surgeon: Renette Butters, MD;  Location: [place];  Service: Orthopedics;  Laterality: Right;  . POLYPECTOMY      Social History   Socioeconomic History  . Marital status: Divorced    Spouse name: Not on file  . Number of children: Not on file  . Years of education: Not on file  . Highest education level: Not on file  Occupational History  . Not on file  Social Needs  . Financial  resource strain: Not on file  . Food insecurity:    Worry: Not on file    Inability: Not on file  . Transportation needs:    Medical: Not on file    Non-medical: Not on file  Tobacco Use  . Smoking status: Never Smoker  . Smokeless tobacco: Never Used  Substance and Sexual Activity  . Alcohol use: Yes    Comment: once a year  . Drug use: No  . Sexual activity: Yes    Birth control/protection: Post-menopausal  Lifestyle  . Physical activity:    Days per week: Not on file    Minutes per session: Not on file  . Stress: Not on file  Relationships  . Social connections:    Talks on phone: Not on file    Gets together: Not on file    Attends religious service: Not on file    Active member of club or organization: Not on file    Attends meetings of clubs or organizations: Not on file    Relationship status: Not on file  . Intimate partner violence:    Fear of current or ex partner: Not on file    Emotionally abused: Not on file    Physically abused: Not  on file    Forced sexual activity: Not on file  Other Topics Concern  . Not on file  Social History Narrative  . Not on file    Current Outpatient Medications on File Prior to Visit  Medication Sig Dispense Refill  . albuterol (PROAIR HFA) 108 (90 Base) MCG/ACT inhaler Inhale 2 puffs into the lungs as needed. 18 g 3  . aspirin EC 81 MG tablet Take 81 mg by mouth daily.    . Aspirin-Acetaminophen-Caffeine (EXCEDRIN EXTRA STRENGTH PO) Take by mouth as needed.    Marland Kitchen atorvastatin (LIPITOR) 80 MG tablet TAKE 1 TABLET BY MOUTH EVERYDAY AT BEDTIME 90 tablet 4  . BAYER CONTOUR NEXT TEST test strip USE TO TEST FOUR TIMES A DAY AS INSTRUCTED.  12  . CONTOUR NEXT TEST test strip USE TO TEST 4 TIMES A DAY AS INSTRUCTED 300 each 1  . diphenhydrAMINE (BENADRYL) 25 MG tablet Take 25 mg by mouth every 6 (six) hours as needed for allergies.     Marland Kitchen EPINEPHRINE 0.3 mg/0.3 mL IJ SOAJ injection INJECT 0.3 ML INTO THE MUSCLE ONCE 2 mL 0  .  Fluticasone-Salmeterol (ADVAIR DISKUS) 250-50 MCG/DOSE AEPB Inhale 1 puff into the lungs 2 (two) times daily. 60 each 5  . Insulin Pen Needle (BD PEN NEEDLE NANO U/F) 32G X 4 MM MISC USE AS DIRECTED 4 TIMES A DAY 390 each 3  . montelukast (SINGULAIR) 10 MG tablet Take 1 tablet (10 mg total) by mouth at bedtime. 90 tablet 1  . naproxen (NAPROSYN) 500 MG tablet Take 1 tablet (500 mg total) by mouth 2 (two) times daily. 60 tablet 1  . traMADol (ULTRAM) 50 MG tablet Take 2 tablets (100 mg total) by mouth every 6 (six) hours as needed for moderate pain. 60 tablet 0  . VICTOZA 18 MG/3ML SOPN INJECT 0.3 MLS (1.8 MG TOTAL) INTO THE SKIN DAILY. 27 pen 0  . Vitamin D, Ergocalciferol, (DRISDOL) 1.25 MG (50000 UT) CAPS capsule Take 1 capsule (50,000 Units total) by mouth every 7 (seven) days. 12 capsule 0   No current facility-administered medications on file prior to visit.      Family History  Problem Relation Age of Onset  . Colon polyps Mother   . Diabetes Mother   . Heart failure Mother   . Peripheral vascular disease Mother   . Diabetes Father   . Heart disease Father   . Arthritis Father   . Hypertension Father   . Heart failure Father   . Prostate cancer Father   . Arthritis Other   . Hyperlipidemia Other   . Heart disease Other   . Hypertension Other   . Depression Other   . Ovarian cancer Other   . Uterine cancer Other   . Breast cancer Other   . Prostate cancer Other   . Breast cancer Cousin   . Colon cancer Neg Hx   . Stomach cancer Neg Hx   . Rectal cancer Neg Hx   . Pancreatic cancer Neg Hx   . Esophageal cancer Neg Hx     BP 130/80 (BP Location: Left Arm, Patient Position: Sitting, Cuff Size: Large)   Pulse 86   Ht 4\' 10"  (1.473 m)   Wt 182 lb 12.8 oz (82.9 kg)   SpO2 96%   BMI 38.21 kg/m    Review of Systems She denies hypoglycemia.     Objective:   Physical Exam VITAL SIGNS:  See vs page GENERAL: no distress Pulses: dorsalis  pedis intact bilat.   MSK: no  deformity of the feet CV: no leg edema Skin:  no ulcer on the feet.  normal color and temp on the feet. Neuro: sensation is intact to touch on the feet.    Lab Results  Component Value Date   HGBA1C 8.9 (A) 10/27/2018       Assessment & Plan:  Insulin-requiring type 2 DM: worse.    Patient Instructions  check your blood sugar twice a day.  vary the time of day when you check, between before the 3 meals, and at bedtime.  also check if you have symptoms of your blood sugar being too high or too low.  please keep a record of the readings and bring it to your next appointment here.  You can write it on any piece of paper.  please call us sooner if your blood sugar goes below 70, or if you have a lot of readings over 200.  Please continue the same insulin, and:  Resume the victoza.   Please come back for a follow-up appointment in 2 months.

## 2018-10-27 NOTE — Patient Instructions (Signed)
check your blood sugar twice a day.  vary the time of day when you check, between before the 3 meals, and at bedtime.  also check if you have symptoms of your blood sugar being too high or too low.  please keep a record of the readings and bring it to your next appointment here.  You can write it on any piece of paper.  please call us sooner if your blood sugar goes below 70, or if you have a lot of readings over 200.  Please continue the same insulin, and:  Resume the victoza.   Please come back for a follow-up appointment in 2 months.

## 2018-12-29 ENCOUNTER — Ambulatory Visit: Payer: BLUE CROSS/BLUE SHIELD | Admitting: Endocrinology

## 2019-01-10 ENCOUNTER — Other Ambulatory Visit: Payer: Self-pay

## 2019-01-12 ENCOUNTER — Other Ambulatory Visit: Payer: Self-pay

## 2019-01-12 ENCOUNTER — Ambulatory Visit: Payer: BC Managed Care – PPO | Admitting: Endocrinology

## 2019-01-12 ENCOUNTER — Encounter: Payer: Self-pay | Admitting: Endocrinology

## 2019-01-12 VITALS — BP 110/70 | HR 83 | Ht <= 58 in | Wt 177.0 lb

## 2019-01-12 DIAGNOSIS — E11319 Type 2 diabetes mellitus with unspecified diabetic retinopathy without macular edema: Secondary | ICD-10-CM | POA: Diagnosis not present

## 2019-01-12 DIAGNOSIS — E119 Type 2 diabetes mellitus without complications: Secondary | ICD-10-CM | POA: Diagnosis not present

## 2019-01-12 DIAGNOSIS — Z794 Long term (current) use of insulin: Secondary | ICD-10-CM

## 2019-01-12 LAB — POCT GLYCOSYLATED HEMOGLOBIN (HGB A1C): Hemoglobin A1C: 8.1 % — AB (ref 4.0–5.6)

## 2019-01-12 MED ORDER — TRESIBA FLEXTOUCH 200 UNIT/ML ~~LOC~~ SOPN: 170.0000 [IU] | PEN_INJECTOR | Freq: Every day | SUBCUTANEOUS | 11 refills | Status: DC

## 2019-01-12 NOTE — Patient Instructions (Signed)
Please change the Lantus to Tresiba, 170 units daily.  I have sent a prescription to your pharmacy.  This is a very slow time-release insulin, so you can take it at any time of day.   check your blood sugar once a day.  vary the time of day when you check, between before the 3 meals, and at bedtime.  also check if you have symptoms of your blood sugar being too high or too low.  please keep a record of the readings and bring it to your next appointment here (or you can bring the meter itself).  You can write it on any piece of paper.  please call us sooner if your blood sugar goes below 70, or if you have a lot of readings over 200. Please come back for a follow-up appointment in 2 months.

## 2019-01-12 NOTE — Progress Notes (Signed)
error 

## 2019-01-12 NOTE — Progress Notes (Signed)
Subjective:    Patient ID: Renee Lam, female    DOB: Nov 13, 1960, 58 y.o.   MRN: [id number]  HPI Pt returns for f/u of diabetes mellitus: DM type: Insulin-requiring type 2.  Dx'ed: 9892 Complications: DR.  Therapy: insulin since 2010, and victoza.  GDM: 1996 and 1999.  DKA: never.  Severe hypoglycemia: never.     Pancreatitis: never.   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   Interval history: She now works 3rd shift.  no cbg record, but states cbg's varies from 125-180.  There is no trend throughout the day. Past Medical History:  Diagnosis Date  . Allergy   . Arthritis   . Asthma   . Chicken pox   . Diabetes mellitus   . Finger infection 2008   isolation  . GERD (gastroesophageal reflux disease)   . Heart murmur    benign per patient  . Hyperlipidemia   . Hypertension    past hx   . Ischemic optic neuropathy    right eye  . Migraine   . OSA (obstructive sleep apnea) 10/25/2016  . Osteoporosis   . Pericarditis   . Right sided sciatica 06/13/2017  . Sleep apnea    Patient is scheduled for sleep study tonight- wears cpap  . Viral meningitis     Past Surgical History:  Procedure Laterality Date  . CARDIAC CATHETERIZATION  2012  . CESAREAN SECTION     1997, 2000  . COLONOSCOPY    . KNEE ARTHROSCOPY Right 2010  . MENISCUS REPAIR Left 10/2017  . ORIF HUMERUS FRACTURE Right 03/25/2015   Procedure: OPEN REDUCTION INTERNAL FIXATION (ORIF) DISTAL HUMERUS FRACTURE;  Surgeon: Renette Butters, MD;  Location: [place];  Service: Orthopedics;  Laterality: Right;  . POLYPECTOMY      Social History   Socioeconomic History  . Marital status: Divorced    Spouse name: Not on file  . Number of children: Not on file  . Years of education: Not on file  . Highest education level: Not on file  Occupational History  . Not on file  Social Needs  . Financial resource  strain: Not on file  . Food insecurity    Worry: Not on file    Inability: Not on file  . Transportation needs    Medical: Not on file    Non-medical: Not on file  Tobacco Use  . Smoking status: Never Smoker  . Smokeless tobacco: Never Used  Substance and Sexual Activity  . Alcohol use: Yes    Comment: once a year  . Drug use: No  . Sexual activity: Yes    Birth control/protection: Post-menopausal  Lifestyle  . Physical activity    Days per week: Not on file    Minutes per session: Not on file  . Stress: Not on file  Relationships  . Social Herbalist on phone: Not on file    Gets together: Not on file    Attends religious service: Not on file    Active member of club or organization: Not on file    Attends meetings of clubs or organizations: Not on file    Relationship status: Not on file  . Intimate partner violence    Fear of current or ex partner: Not on file    Emotionally abused: Not on file    Physically abused: Not on file  Forced sexual activity: Not on file  Other Topics Concern  . Not on file  Social History Narrative  . Not on file    Current Outpatient Medications on File Prior to Visit  Medication Sig Dispense Refill  . albuterol (PROAIR HFA) 108 (90 Base) MCG/ACT inhaler Inhale 2 puffs into the lungs as needed. 18 g 3  . aspirin EC 81 MG tablet Take 81 mg by mouth daily.    . Aspirin-Acetaminophen-Caffeine (EXCEDRIN EXTRA STRENGTH PO) Take by mouth as needed.    Marland Kitchen atorvastatin (LIPITOR) 80 MG tablet TAKE 1 TABLET BY MOUTH EVERYDAY AT BEDTIME 90 tablet 4  . BAYER CONTOUR NEXT TEST test strip USE TO TEST FOUR TIMES A DAY AS INSTRUCTED.  12  . CONTOUR NEXT TEST test strip USE TO TEST 4 TIMES A DAY AS INSTRUCTED 300 each 1  . diphenhydrAMINE (BENADRYL) 25 MG tablet Take 25 mg by mouth every 6 (six) hours as needed for allergies.     Marland Kitchen EPINEPHRINE 0.3 mg/0.3 mL IJ SOAJ injection INJECT 0.3 ML INTO THE MUSCLE ONCE 2 mL 0  . Fluticasone-Salmeterol  (ADVAIR DISKUS) 250-50 MCG/DOSE AEPB Inhale 1 puff into the lungs 2 (two) times daily. 60 each 5  . Insulin Pen Needle (BD PEN NEEDLE NANO U/F) 32G X 4 MM MISC USE AS DIRECTED 4 TIMES A DAY 390 each 3  . montelukast (SINGULAIR) 10 MG tablet Take 1 tablet (10 mg total) by mouth at bedtime. 90 tablet 1  . naproxen (NAPROSYN) 500 MG tablet Take 1 tablet (500 mg total) by mouth 2 (two) times daily. 60 tablet 1  . traMADol (ULTRAM) 50 MG tablet Take 2 tablets (100 mg total) by mouth every 6 (six) hours as needed for moderate pain. 60 tablet 0  . VICTOZA 18 MG/3ML SOPN INJECT 0.3 MLS (1.8 MG TOTAL) INTO THE SKIN DAILY. 27 pen 0  . Vitamin D, Ergocalciferol, (DRISDOL) 1.25 MG (50000 UT) CAPS capsule Take 1 capsule (50,000 Units total) by mouth every 7 (seven) days. 12 capsule 0   No current facility-administered medications on file prior to visit.       Family History  Problem Relation Age of Onset  . Colon polyps Mother   . Diabetes Mother   . Heart failure Mother   . Peripheral vascular disease Mother   . Diabetes Father   . Heart disease Father   . Arthritis Father   . Hypertension Father   . Heart failure Father   . Prostate cancer Father   . Arthritis Other   . Hyperlipidemia Other   . Heart disease Other   . Hypertension Other   . Depression Other   . Ovarian cancer Other   . Uterine cancer Other   . Breast cancer Other   . Prostate cancer Other   . Breast cancer Cousin   . Colon cancer Neg Hx   . Stomach cancer Neg Hx   . Rectal cancer Neg Hx   . Pancreatic cancer Neg Hx   . Esophageal cancer Neg Hx     BP 110/70 (BP Location: Left Arm, Patient Position: Sitting, Cuff Size: Large)   Pulse 83   Ht 4\' 10"  (1.473 m)   Wt 177 lb (80.3 kg)   SpO2 98%   BMI 36.99 kg/m    Review of Systems She denies hypoglycemia.     Objective:   Physical Exam VITAL SIGNS:  See vs page GENERAL: no distress Pulses: dorsalis pedis intact bilat.   MSK:  no deformity of the feet CV:  no leg edema Skin:  no ulcer on the feet.  normal color and temp on the feet. Neuro: sensation is intact to touch on the feet   Lab Results  Component Value Date   HGBA1C 8.1 (A) 01/12/2019       Assessment & Plan:  Insulin-requiring type 2 DM, with DR: worse.  Occupational status (3rd shift): in this setting, she needs a flat 24-HR insulin profile.  Patient Instructions  Please change the Lantus to Tresiba, 170 units daily.  I have sent a prescription to your pharmacy.  This is a very slow time-release insulin, so you can take it at any time of day.   check your blood sugar once a day.  vary the time of day when you check, between before the 3 meals, and at bedtime.  also check if you have symptoms of your blood sugar being too high or too low.  please keep a record of the readings and bring it to your next appointment here (or you can bring the meter itself).  You can write it on any piece of paper.  please call us sooner if your blood sugar goes below 70, or if you have a lot of readings over 200. Please come back for a follow-up appointment in 2 months.

## 2019-02-21 ENCOUNTER — Other Ambulatory Visit: Payer: Self-pay

## 2019-02-21 MED ORDER — BD PEN NEEDLE NANO U/F 32G X 4 MM MISC: 3 refills | Status: DC

## 2019-02-21 MED ORDER — VICTOZA 18 MG/3ML ~~LOC~~ SOPN: PEN_INJECTOR | SUBCUTANEOUS | 0 refills | Status: DC

## 2019-02-28 ENCOUNTER — Telehealth: Payer: Self-pay

## 2019-02-28 NOTE — Telephone Encounter (Signed)
Received notification from [place] that PA for Renee Lam has been denied. Following reasons provided: pt does not have pharmacy benefits. Called pt to inform her about this denial and to inquire further. States she no longer has [place] but now has [place]. Advised that we will need this insurance info in order to complete PA accurately and efficiently. Provided the following:  BIN: [id number] Grp#: [id number] Member ID: [id number]  Advised PA will not be completed today but will complete within 24-48 business hours. Verbalized acceptance and understanding.   Documents have been labeled and placed in scan file for HIM and for our future reference.

## 2019-02-28 NOTE — Telephone Encounter (Signed)
PA initiated today through Cover My Meds for [place]. Will await insurance response re: approval/denial.  Tirsa Sabir ([id number])  Your information has been submitted to [place]. Blue Cross [place] will review the request and fax you a determination directly, typically within 3 business days of your submission once all necessary information is received.  If [employer] [place] has not responded in 3 business days or if you have any questions about your submission, contact [place] at [phone].

## 2019-03-01 NOTE — Telephone Encounter (Signed)
FIRST ATTEMPT: Received by Cover My Meds did not have the correct insurance information according to our system. Resulted in denial d/t no benefits. As a results, that request was archived through [place] My Meds and a new PA immediately submitted using [employer] [place] (scanned insurance card in Epic). Advised response time would be 3 business days.  Second attempt: As stated above, submitted PA using [employer]BS [place] (scanned insurance card in Epic). Result of this PA has been previously documented and in which resulted in a call to the pt to clarify her insurance. That conversation also documented.  THIRD AND FINAL ATTEMPT: Another PA initiated today through Cover My Meds for [place] using BCBS - [employer] info provided by pt below:  BIN: [id number] Grp: [id number] Member ID: [id number]  Following response received:   Cannot find matching patient with Name and Date of Birth provided. For additional information, please contact the phone number on the back of the member prescription ID card.  In light of the many failed attempts made, no further attempts to complete PA will be completed. Pt will need to supply our office with a hard copy of her insurance card. A new PA will be completed at that time.  Bobbye Morton (Key: [id number]) Tyler Aas FlexTouch (insulin degludec injection) 200 Units/mL solution   Form OptumRx Electronic Prior Authorization Form (2017 NCPDP) Created 2 minutes ago Sent to Plan less than a minute ago Determination Message from [place] find matching patient with Name and Date of Birth provided. For additional information, please contact the phone number on the back of the member prescription ID card.  Indication for TRESIBA   TRESIBA  is a long-acting human insulin analog indicated to improve glycemic control in patients 1 year of age and older with diabetes mellitus.  Limitations of use:  Not recommended for treating diabetic ketoacidosis. Not recommended for pediatric  patients requiring less than 5 units of TRESIBA . Common ICD-10 diagnosis codes include:  E10 (Type 1 diabetes mellitus) E11 (Type 2 diabetes mellitus) For Prescribing Information click here.  For Important Safety Information click here.  Tyler Aas FlexTouch U100 only comes in packs of five 3 mL pens (total of 15 mL). Quantity is typically:  15 or 30 mL per 30 days 45 or 90 mL per 90 days Tresiba FlexTouch U200 only comes in packs of three 3 mL pens (total of 9 mL). Quantity is typically:  9 or 18 mL per 30 days 27 or 54 mL per 90 days ? There was an error with your request  ? Cannot find matching patient with Name and Date of Birth provided. For additional information, please contact the phone number on the back of the member prescription ID card.  ? Prescriber Instructions This is an Administrator, sports form (ePA). Complete the fields below, then click the "Send to Plan" button to submit. ? Patient Clear Renee Lam? Name [place] Address [address]  [place] [address] Date of Birth 06-May-1961 Gender  Female  Female Phone Patient Phone Member ID The Member ID is found on the front of the member's ID card. It is commonly labeled as Rx ID. Member ID [id number] ? Pharmacy Info (Optional) [facility] Info [facility] [address] [place], [place] [address] By including pharmacy information, you authorize CoverMyMeds to contact the pharmacy on your behalf to report any approvals, as well as for PA follow-up. ?  Drug Requested Medication Name Tyler Aas FlexTouch (insulin degludec injection) 200 Units/mL solution Confirm Dosage Form  Quantity (Enter numeric value with up to 2 decimal places and a preceding zero before the decimal when applicable. For example, .5 must be entered as 0.5) 9 Days Supply Number of DAYS up to three  digits 30 ? Provider Clear Renato Shin? (+4 others) NPI [id number] Name First SEAN [place], [place] [address] (Optional) [place]  Zip [phone] [address] Phone [phone] Fax [phone] ? Prescriber Next Steps Click the "Send to Plan" button to submit this information to OptumRx. (If it is disabled, be sure all required fields have been completed.) This electronic submission does not require a signature. OptumRx will respond automatically with your next steps. Send to Plan

## 2019-03-02 NOTE — Telephone Encounter (Signed)
LMTCB to clarify insurance issues

## 2019-03-02 NOTE — Telephone Encounter (Signed)
Renee Lam 58 year old female 03/19/1961 Comm Pref:  Works at [employer] [place] [address] [phone] Jerilynn Mages)   Demographics still reflect the same name as what I previously used to authorize her medication. Please advise what changed so I can proceed with auth.

## 2019-03-02 NOTE — Telephone Encounter (Signed)
Pt is aware of the insurance issues we are currently having--she has informed the pharmacy of the insurance change so that is clear-  Name Renee Lam with insurance is different--changed name in system  Try again with new name

## 2019-03-06 NOTE — Telephone Encounter (Signed)
Please refer below to the issue encountered when completing PA for the 4th time using name provided. Unfortunately, we will need to have pt provide a copy of her card so a phone call can be placed to her insurance company to resolve this ongoing issue.  Madellyn Conner (Key: [id number]) Tyler Aas FlexTouch (insulin degludec injection) 200 Units/mL solution   Form OptumRx Electronic Prior Authorization Form (2017 NCPDP) Created 2 minutes ago Sent to Plan less than a minute ago Determination Message from [place] find matching patient with Name and Date of Birth provided. For additional information, please contact the phone number on the back of the member prescription ID card.  ? There was an error with your request  ? Cannot find matching patient with Name and Date of Birth provided. For additional information, please contact the phone number on the back of the member prescription ID card.

## 2019-03-06 NOTE — Telephone Encounter (Signed)
ATC pt no answer and no VM pick up, after 2 rings call dropped

## 2019-03-12 ENCOUNTER — Other Ambulatory Visit: Payer: Self-pay

## 2019-03-12 ENCOUNTER — Telehealth: Payer: Self-pay | Admitting: Endocrinology

## 2019-03-12 NOTE — Telephone Encounter (Signed)
Patient states that she needs a P.A. for Insulin Degludec (TRESIBA FLEXTOUCH) 200 UNIT/ML SOPN. [facility] [address] [facility] [place] [facility] a request for P.A. for the above mecdication to our office approx. 2 weeks ago but have not received P.A. to date.

## 2019-03-12 NOTE — Telephone Encounter (Signed)
Please call pt as documented and ask that she supply a copy of her insurance card. Please refer to previously documented attempts:  Please refer below to the issue encountered when completing PA for the 4th time using name provided. Unfortunately, we will need to have pt provide a copy of her card so a phone call can be placed to her insurance company to resolve this ongoing issue.  Shayla Noguera (Key: [id number]) Tyler Aas FlexTouch (insulin degludec injection) 200 Units/mL solution   Form OptumRx Electronic Prior Authorization Form (2017 NCPDP) Created 2 minutes ago Sent to Plan less than a minute ago Determination Message from [place] find matching patient with Name and Date of Birth provided. For additional information, please contact the phone number on the back of the member prescription ID card.  ? There was an error with your request  ? Cannot find matching patient with Name and Date of Birth provided. For additional information, please contact the phone number on the back of the member prescription ID card.

## 2019-03-13 NOTE — Telephone Encounter (Signed)
Patient will bring insurance card to visit.

## 2019-03-13 NOTE — Telephone Encounter (Signed)
Will once again complete PA once pt presents NEW insurance card at her appt 03/14/19.

## 2019-03-13 NOTE — Telephone Encounter (Signed)
LMTCB

## 2019-03-14 ENCOUNTER — Ambulatory Visit: Payer: BC Managed Care – PPO | Admitting: Endocrinology

## 2019-03-14 ENCOUNTER — Other Ambulatory Visit: Payer: Self-pay

## 2019-03-14 ENCOUNTER — Encounter: Payer: Self-pay | Admitting: Endocrinology

## 2019-03-14 VITALS — BP 128/80 | HR 92 | Ht <= 58 in | Wt 176.8 lb

## 2019-03-14 DIAGNOSIS — E119 Type 2 diabetes mellitus without complications: Secondary | ICD-10-CM | POA: Diagnosis not present

## 2019-03-14 DIAGNOSIS — E11319 Type 2 diabetes mellitus with unspecified diabetic retinopathy without macular edema: Secondary | ICD-10-CM | POA: Diagnosis not present

## 2019-03-14 DIAGNOSIS — Z794 Long term (current) use of insulin: Secondary | ICD-10-CM | POA: Diagnosis not present

## 2019-03-14 LAB — POCT GLYCOSYLATED HEMOGLOBIN (HGB A1C): Hemoglobin A1C: 8.7 % — AB (ref 4.0–5.6)

## 2019-03-14 MED ORDER — TRESIBA FLEXTOUCH 200 UNIT/ML ~~LOC~~ SOPN: 170.0000 [IU] | PEN_INJECTOR | Freq: Every day | SUBCUTANEOUS | 11 refills | Status: DC

## 2019-03-14 NOTE — Telephone Encounter (Signed)
Called [employer] [place] and spoke with Tess. PA initiated today, Case # [id number] for [place]. Once all clinical information was provided verbally, Tess indicated she would be faxing a form for Dr. Loanne Drilling to list all tried failures. Asked the form be faxed back to [phone]. Will await this form and insurance response re: approval/denial.

## 2019-03-14 NOTE — Progress Notes (Addendum)
Subjective:    Patient ID: Renee Lam, female    DOB: 06/12/1961, 58 y.o.   MRN: [id number]  HPI Pt returns for f/u of diabetes mellitus: DM type: Insulin-requiring type 2.  Dx'ed: [id number] Complications: DR.  Therapy: insulin since 2010, and victoza.  GDM: 1996 and 1999.  DKA: never.  Severe hypoglycemia: never.     Pancreatitis: never.   Other: she takes QD insulin, after poor results with multiple daily injections; she works 3rd shift, as a Marine scientist; she did not tolerate metformin (2013-2017--stopped due to GI upset) Interval history: no cbg record, but states cbg's varied from 125-225, when she was on the [place].  There is no trend throughout the day.  She ran out of [place] 2 weeks ago, due to ins needing PA.   Past Medical History:  Diagnosis Date  . Allergy   . Arthritis   . Asthma   . Chicken pox   . Diabetes mellitus   . Finger infection 2008   isolation  . GERD (gastroesophageal reflux disease)   . Heart murmur    benign per patient  . Hyperlipidemia   . Hypertension    past hx   . Ischemic optic neuropathy    right eye  . Migraine   . OSA (obstructive sleep apnea) 10/25/2016  . Osteoporosis   . Pericarditis   . Right sided sciatica 06/13/2017  . Sleep apnea    Patient is scheduled for sleep study tonight- wears cpap  . Viral meningitis     Past Surgical History:  Procedure Laterality Date  . CARDIAC CATHETERIZATION  2012  . CESAREAN SECTION     1997, 2000  . COLONOSCOPY    . KNEE ARTHROSCOPY Right 2010  . MENISCUS REPAIR Left 10/2017  . ORIF HUMERUS FRACTURE Right 03/25/2015   Procedure: OPEN REDUCTION INTERNAL FIXATION (ORIF) DISTAL HUMERUS FRACTURE;  Surgeon: Renette Butters, MD;  Location: [facility];  Service: Orthopedics;  Laterality: Right;  . POLYPECTOMY      Social History   Socioeconomic History  . Marital status: Divorced    Spouse name: Not on file  . Number of children: Not on file  . Years of education: Not on file   . Highest education level: Not on file  Occupational History  . Not on file  Social Needs  . Financial resource strain: Not on file  . Food insecurity    Worry: Not on file    Inability: Not on file  . Transportation needs    Medical: Not on file    Non-medical: Not on file  Tobacco Use  . Smoking status: Never Smoker  . Smokeless tobacco: Never Used  Substance and Sexual Activity  . Alcohol use: Yes    Comment: once a year  . Drug use: No  . Sexual activity: Yes    Birth control/protection: Post-menopausal  Lifestyle  . Physical activity    Days per week: Not on file    Minutes per session: Not on file  . Stress: Not on file  Relationships  . Social Herbalist on phone: Not on file    Gets together: Not on file    Attends religious service: Not on file    Active member of club or organization: Not on file    Attends meetings of clubs or organizations: Not on file    Relationship status: Not on file  . Intimate partner violence    Fear of current or  ex partner: Not on file    Emotionally abused: Not on file    Physically abused: Not on file    Forced sexual activity: Not on file  Other Topics Concern  . Not on file  Social History Narrative  . Not on file    Current Outpatient Medications on File Prior to Visit  Medication Sig Dispense Refill  . albuterol (PROAIR HFA) 108 (90 Base) MCG/ACT inhaler Inhale 2 puffs into the lungs as needed. 18 g 3  . aspirin EC 81 MG tablet Take 81 mg by mouth daily.    . Aspirin-Acetaminophen-Caffeine (EXCEDRIN EXTRA STRENGTH PO) Take by mouth as needed.    Marland Kitchen atorvastatin (LIPITOR) 80 MG tablet TAKE 1 TABLET BY MOUTH EVERYDAY AT BEDTIME 90 tablet 4  . BAYER CONTOUR NEXT TEST test strip USE TO TEST FOUR TIMES A DAY AS INSTRUCTED.  12  . CONTOUR NEXT TEST test strip USE TO TEST 4 TIMES A DAY AS INSTRUCTED 300 each 1  . diphenhydrAMINE (BENADRYL) 25 MG tablet Take 25 mg by mouth every 6 (six) hours as needed for allergies.      Marland Kitchen EPINEPHRINE 0.3 mg/0.3 mL IJ SOAJ injection INJECT 0.3 ML INTO THE MUSCLE ONCE 2 mL 0  . Fluticasone-Salmeterol (ADVAIR DISKUS) 250-50 MCG/DOSE AEPB Inhale 1 puff into the lungs 2 (two) times daily. 60 each 5  . Insulin Pen Needle (BD PEN NEEDLE NANO U/F) 32G X 4 MM MISC USE AS DIRECTED 4 TIMES A DAY 390 each 3  . liraglutide (VICTOZA) 18 MG/3ML SOPN INJECT 0.3 MLS (1.8 MG TOTAL) INTO THE SKIN DAILY. 27 pen 0  . montelukast (SINGULAIR) 10 MG tablet Take 1 tablet (10 mg total) by mouth at bedtime. 90 tablet 1  . naproxen (NAPROSYN) 500 MG tablet Take 1 tablet (500 mg total) by mouth 2 (two) times daily. 60 tablet 1  . traMADol (ULTRAM) 50 MG tablet Take 2 tablets (100 mg total) by mouth every 6 (six) hours as needed for moderate pain. 60 tablet 0  . Vitamin D, Ergocalciferol, (DRISDOL) 1.25 MG (50000 UT) CAPS capsule Take 1 capsule (50,000 Units total) by mouth every 7 (seven) days. 12 capsule 0   No current facility-administered medications on file prior to visit.       Family History  Problem Relation Age of Onset  . Colon polyps Mother   . Diabetes Mother   . Heart failure Mother   . Peripheral vascular disease Mother   . Diabetes Father   . Heart disease Father   . Arthritis Father   . Hypertension Father   . Heart failure Father   . Prostate cancer Father   . Arthritis Other   . Hyperlipidemia Other   . Heart disease Other   . Hypertension Other   . Depression Other   . Ovarian cancer Other   . Uterine cancer Other   . Breast cancer Other   . Prostate cancer Other   . Breast cancer Cousin   . Colon cancer Neg Hx   . Stomach cancer Neg Hx   . Rectal cancer Neg Hx   . Pancreatic cancer Neg Hx   . Esophageal cancer Neg Hx     BP 128/80 (BP Location: Left Arm, Patient Position: Sitting, Cuff Size: Normal)   Pulse 92   Ht 4\' 10"  (1.473 m)   Wt 176 lb 12.8 oz (80.2 kg)   SpO2 98%   BMI 36.95 kg/m   Review of Systems She denies hypoglycemia.  Objective:    Physical Exam VITAL SIGNS:  See vs page GENERAL: no distress Pulses: dorsalis pedis intact bilat.   MSK: no deformity of the feet.   CV: no leg edema.   Skin:  no ulcer on the feet.  normal color and temp on the feet.  Neuro: sensation is intact to touch on the feet.    Lab Results  Component Value Date   HGBA1C 8.7 (A) 03/14/2019       Assessment & Plan:  Insulin-requiring type 2 DM, with DR: worse off insulin. 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   Patient Instructions  Please continue the Tresiba, 170 units daily.  We'll do the prior authorization for this.  This is a very slow time-release insulin, so you can take it at any time of day.   check your blood sugar once a day.  vary the time of day when you check, between before the 3 meals, and at bedtime.  also check if you have symptoms of your blood sugar being too high or too low.  please keep a record of the readings and bring it to your next appointment here (or you can bring the meter itself).  You can write it on any piece of paper.  please call us sooner if your blood sugar goes below 70, or if you have a lot of readings over 200. Please come back for a follow-up appointment in 2 months.

## 2019-03-14 NOTE — Patient Instructions (Addendum)
Please continue the Tresiba, 170 units daily.  We'll do the prior authorization for this.  This is a very slow time-release insulin, so you can take it at any time of day.   check your blood sugar once a day.  vary the time of day when you check, between before the 3 meals, and at bedtime.  also check if you have symptoms of your blood sugar being too high or too low.  please keep a record of the readings and bring it to your next appointment here (or you can bring the meter itself).  You can write it on any piece of paper.  please call us sooner if your blood sugar goes below 70, or if you have a lot of readings over 200. Please come back for a follow-up appointment in 2 months.

## 2019-03-14 NOTE — Telephone Encounter (Signed)
Company: [employer] [facility]  Document: PA request form Other records requested: Supporting clinical documentation  All above requested information has been faxed successfully to [employer] listed above. Documents and fax confirmation have been placed in the faxed file for future reference.

## 2019-03-15 NOTE — Telephone Encounter (Signed)
Received notification from [facility] that [place] for Renee Lam has been approved 03/14/19 through 03/13/20. Documents have been labeled and placed in scan file for HIM and for our future reference.

## 2019-03-16 DIAGNOSIS — H25813 Combined forms of age-related cataract, bilateral: Secondary | ICD-10-CM | POA: Diagnosis not present

## 2019-03-16 DIAGNOSIS — H43813 Vitreous degeneration, bilateral: Secondary | ICD-10-CM | POA: Diagnosis not present

## 2019-03-16 DIAGNOSIS — H47011 Ischemic optic neuropathy, right eye: Secondary | ICD-10-CM | POA: Diagnosis not present

## 2019-04-23 ENCOUNTER — Telehealth: Payer: Self-pay

## 2019-04-23 NOTE — Telephone Encounter (Signed)
Thank you.  Can PA be resubmitted with this info?

## 2019-04-23 NOTE — Telephone Encounter (Signed)
Called pt to inquire further. States she stopped Metformin d/t severe gastrointestinal issues. Uncertain if you want this marked as intolerance on her allergy list

## 2019-04-23 NOTE — Telephone Encounter (Signed)
This information must be in your notes so that it can be faxed as requested by [facility] request.

## 2019-04-23 NOTE — Telephone Encounter (Signed)
please contact patient: Ins wants you to take metformin first.  You stopped it in 2017.  Do you know why?

## 2019-04-23 NOTE — Telephone Encounter (Signed)
PA initiated today through Cover My Meds for Victoza.   Renee Lam (Key: [id number]) Rx #: [id number] Victoza 18MG [name] pen-injectors   Form OptumRx Electronic Prior Authorization Form (2017 NCPDP) Created 1 day ago Sent to Plan 2 minutes ago Plan Response 2 minutes ago Submit Clinical Questions less than a minute ago Determination Message from [name] find matching patient  Given above response, called [employer] [place] and spoke with [place], [phone]. States he is able to see the PA that was initiated and could not answer why an error was received. Asked that I fax clinical information to [phone], marked as URGENT.

## 2019-04-23 NOTE — Telephone Encounter (Signed)
done

## 2019-04-23 NOTE — Telephone Encounter (Signed)
Office notes faxed to [facility] reflecting failed Metformin therapy. Will continue to await PA response.

## 2019-04-24 NOTE — Telephone Encounter (Signed)
Received notification from [facility] that [place] for [place] has been approved 04/24/19 through 04/23/20. Documents have been labeled and placed in scan file for HIM and for our future reference.

## 2019-04-27 ENCOUNTER — Other Ambulatory Visit: Payer: Self-pay

## 2019-04-27 ENCOUNTER — Telehealth (INDEPENDENT_AMBULATORY_CARE_PROVIDER_SITE_OTHER): Payer: BC Managed Care – PPO | Admitting: Internal Medicine

## 2019-04-27 DIAGNOSIS — B354 Tinea corporis: Secondary | ICD-10-CM

## 2019-04-27 NOTE — Progress Notes (Signed)
Virtual Visit via Video Note  I connected with Renee Lam on 04/27/19 at  1:00 PM EST by a video enabled telemedicine application and verified that I am speaking with the correct person using two identifiers.  Location patient: home Location provider: work office Persons participating in the virtual visit: patient, provider  I discussed the limitations of evaluation and management by telemedicine and the availability of in person appointments. The patient expressed understanding and agreed to proceed.   HPI: She has a rash under her right arm, close to her armpit. She only noticed it last night. It is very itchy. It is round in shape and she thinks it is ringworm.   ROS: Constitutional: Denies fever, chills, diaphoresis, appetite change and fatigue.  HEENT: Denies photophobia, eye pain, redness, hearing loss, ear pain, congestion, sore throat, rhinorrhea, sneezing, mouth sores, trouble swallowing, neck pain, neck stiffness and tinnitus.   Respiratory: Denies SOB, DOE, cough, chest tightness,  and wheezing.   Cardiovascular: Denies chest pain, palpitations and leg swelling.  Gastrointestinal: Denies nausea, vomiting, abdominal pain, diarrhea, constipation, blood in stool and abdominal distention.  Genitourinary: Denies dysuria, urgency, frequency, hematuria, flank pain and difficulty urinating.  Endocrine: Denies: hot or cold intolerance, sweats, changes in hair or nails, polyuria, polydipsia. Musculoskeletal: Denies myalgias, back pain, joint swelling, arthralgias and gait problem.  Skin: Denies pallor, rash and wound.  Neurological: Denies dizziness, seizures, syncope, weakness, light-headedness, numbness and headaches.  Hematological: Denies adenopathy. Easy bruising, personal or family bleeding history  Psychiatric/Behavioral: Denies suicidal ideation, mood changes, confusion, nervousness, sleep disturbance and agitation   Past Medical History:  Diagnosis Date  .  Allergy   . Arthritis   . Asthma   . Chicken pox   . Diabetes mellitus   . Finger infection 2008   isolation  . GERD (gastroesophageal reflux disease)   . Heart murmur    benign per patient  . Hyperlipidemia   . Hypertension    past hx   . Ischemic optic neuropathy    right eye  . Migraine   . OSA (obstructive sleep apnea) 10/25/2016  . Osteoporosis   . Pericarditis   . Right sided sciatica 06/13/2017  . Sleep apnea    Patient is scheduled for sleep study tonight- wears cpap  . Viral meningitis     Past Surgical History:  Procedure Laterality Date  . CARDIAC CATHETERIZATION  2012  . CESAREAN SECTION     1997, 2000  . COLONOSCOPY    . KNEE ARTHROSCOPY Right 2010  . MENISCUS REPAIR Left 10/2017  . ORIF HUMERUS FRACTURE Right 03/25/2015   Procedure: OPEN REDUCTION INTERNAL FIXATION (ORIF) DISTAL HUMERUS FRACTURE;  Surgeon: Renette Butters, MD;  Location: [facility];  Service: Orthopedics;  Laterality: Right;  . POLYPECTOMY      Family History  Problem Relation Age of Onset  . Colon polyps Mother   . Diabetes Mother   . Heart failure Mother   . Peripheral vascular disease Mother   . Diabetes Father   . Heart disease Father   . Arthritis Father   . Hypertension Father   . Heart failure Father   . Prostate cancer Father   . Arthritis Other   . Hyperlipidemia Other   . Heart disease Other   . Hypertension Other   . Depression Other   . Ovarian cancer Other   . Uterine cancer Other   . Breast cancer Other   . Prostate cancer Other   .  Breast cancer Cousin   . Colon cancer Neg Hx   . Stomach cancer Neg Hx   . Rectal cancer Neg Hx   . Pancreatic cancer Neg Hx   . Esophageal cancer Neg Hx     SOCIAL HX:   reports that she has never smoked. She has never used smokeless tobacco. She reports current alcohol use. She reports that she does not use drugs.   Current Outpatient Medications:  .  albuterol (PROAIR HFA) 108 (90 Base) MCG/ACT inhaler,  Inhale 2 puffs into the lungs as needed., Disp: 18 g, Rfl: 3 .  aspirin EC 81 MG tablet, Take 81 mg by mouth daily., Disp: , Rfl:  .  Aspirin-Acetaminophen-Caffeine (EXCEDRIN EXTRA STRENGTH PO), Take by mouth as needed., Disp: , Rfl:  .  atorvastatin (LIPITOR) 80 MG tablet, TAKE 1 TABLET BY MOUTH EVERYDAY AT BEDTIME, Disp: 90 tablet, Rfl: 4 .  BAYER CONTOUR NEXT TEST test strip, USE TO TEST FOUR TIMES A DAY AS INSTRUCTED., Disp: , Rfl: 12 .  CONTOUR NEXT TEST test strip, USE TO TEST 4 TIMES A DAY AS INSTRUCTED, Disp: 300 each, Rfl: 1 .  diphenhydrAMINE (BENADRYL) 25 MG tablet, Take 25 mg by mouth every 6 (six) hours as needed for allergies. , Disp: , Rfl:  .  EPINEPHRINE 0.3 mg/0.3 mL IJ SOAJ injection, INJECT 0.3 ML INTO THE MUSCLE ONCE, Disp: 2 mL, Rfl: 0 .  Fluticasone-Salmeterol (ADVAIR DISKUS) 250-50 MCG/DOSE AEPB, Inhale 1 puff into the lungs 2 (two) times daily., Disp: 60 each, Rfl: 5 .  Insulin Degludec (TRESIBA FLEXTOUCH) 200 UNIT/ML SOPN, Inject 170 Units into the skin daily., Disp: 9 pen, Rfl: 11 .  Insulin Pen Needle (BD PEN NEEDLE NANO U/F) 32G X 4 MM MISC, USE AS DIRECTED 4 TIMES A DAY, Disp: 390 each, Rfl: 3 .  liraglutide (VICTOZA) 18 MG/3ML SOPN, INJECT 0.3 MLS (1.8 MG TOTAL) INTO THE SKIN DAILY., Disp: 27 pen, Rfl: 0 .  montelukast (SINGULAIR) 10 MG tablet, Take 1 tablet (10 mg total) by mouth at bedtime., Disp: 90 tablet, Rfl: 1 .  naproxen (NAPROSYN) 500 MG tablet, Take 1 tablet (500 mg total) by mouth 2 (two) times daily., Disp: 60 tablet, Rfl: 1 .  traMADol (ULTRAM) 50 MG tablet, Take 2 tablets (100 mg total) by mouth every 6 (six) hours as needed for moderate pain., Disp: 60 tablet, Rfl: 0 .  Vitamin D, Ergocalciferol, (DRISDOL) 1.25 MG (50000 UT) CAPS capsule, Take 1 capsule (50,000 Units total) by mouth every 7 (seven) days., Disp: 12 capsule, Rfl: 0  EXAM:   VITALS per patient if applicable: none reported  GENERAL: alert, oriented, appears well and in no acute distress   HEENT: atraumatic, conjunttiva clear, no obvious abnormalities on inspection of external nose and ears  NECK: normal movements of the head and neck  LUNGS: on inspection no signs of respiratory distress, breathing rate appears normal, no obvious gross increased work of breathing, gasping or wheezing  CV: no obvious cyanosis  MS: moves all visible extremities without noticeable abnormality  PSYCH/NEURO: pleasant and cooperative, no obvious depression or anxiety, speech and thought processing grossly intact  SKIN: Small circumferential erythematous rash with central clearing under her right arm.  ASSESSMENT AND PLAN:   Ringworm of body -Advised Lamisil BID. -RTC if no improvement.    I discussed the assessment and treatment plan with the patient. The patient was provided an opportunity to ask questions and all were answered. The patient agreed with the plan  and demonstrated an understanding of the instructions.   The patient was advised to call back or seek an in-person evaluation if the symptoms worsen or if the condition fails to improve as anticipated.    Lelon Frohlich, MD  [facility]

## 2019-05-14 ENCOUNTER — Other Ambulatory Visit: Payer: Self-pay

## 2019-05-16 ENCOUNTER — Encounter: Payer: BC Managed Care – PPO | Attending: Endocrinology | Admitting: Nutrition

## 2019-05-16 ENCOUNTER — Encounter: Payer: Self-pay | Admitting: Endocrinology

## 2019-05-16 ENCOUNTER — Other Ambulatory Visit: Payer: Self-pay

## 2019-05-16 ENCOUNTER — Ambulatory Visit: Payer: BC Managed Care – PPO | Admitting: Endocrinology

## 2019-05-16 VITALS — BP 134/76 | HR 97 | Ht <= 58 in | Wt 177.6 lb

## 2019-05-16 DIAGNOSIS — E119 Type 2 diabetes mellitus without complications: Secondary | ICD-10-CM

## 2019-05-16 DIAGNOSIS — E669 Obesity, unspecified: Secondary | ICD-10-CM | POA: Diagnosis not present

## 2019-05-16 DIAGNOSIS — E1121 Type 2 diabetes mellitus with diabetic nephropathy: Secondary | ICD-10-CM | POA: Insufficient documentation

## 2019-05-16 LAB — POCT GLYCOSYLATED HEMOGLOBIN (HGB A1C): Hemoglobin A1C: 9.5 % — AB (ref 4.0–5.6)

## 2019-05-16 MED ORDER — FARXIGA 10 MG PO TABS: 10.0000 mg | ORAL_TABLET | Freq: Every day | ORAL | 11 refills | Status: DC

## 2019-05-16 MED ORDER — TRESIBA FLEXTOUCH 200 UNIT/ML ~~LOC~~ SOPN: 200.0000 [IU] | PEN_INJECTOR | Freq: Every day | SUBCUTANEOUS | 11 refills | Status: DC

## 2019-05-16 NOTE — Patient Instructions (Addendum)
Please increase the Tresiba to 200 units daily. This is a very slow time-release insulin, so you can take it at any time of day.   I have sent a prescription to your pharmacy, to add "Farxiga."   check your blood sugar once a day.  vary the time of day when you check, between before the 3 meals, and at bedtime.  also check if you have symptoms of your blood sugar being too high or too low.  please keep a record of the readings and bring it to your next appointment here (or you can bring the meter itself).  You can write it on any piece of paper.  please call us sooner if your blood sugar goes below 70, or if you have a lot of readings over 200. Please come back for a follow-up appointment in 2 months.

## 2019-05-16 NOTE — Patient Instructions (Signed)
Read over information given and go to the website to learn more Call when pump comes in.

## 2019-05-16 NOTE — Progress Notes (Signed)
We discussed insulin pump therapy--advantages and disadvantages.  She was shown the 3 models and we discussed the differences and she chose the [employer].  Paperwork was filled out and faxed to [employer].

## 2019-05-16 NOTE — Progress Notes (Signed)
Subjective:    Patient ID: Renee Lam, female    DOB: May 04, 1961, 58 y.o.   MRN: [id number]  HPI Pt returns for f/u of diabetes mellitus: DM type: Insulin-requiring type 2.  Dx'ed: [id number] Complications: DR.  Therapy: insulin since 2010, and victoza.  GDM: 1996 and 1999.  DKA: never.  Severe hypoglycemia: never.     Pancreatitis: never.   Other: she takes QD insulin, after poor results with multiple daily injections; she works 3rd shift, as a Marine scientist; she did not tolerate metformin (2013-2017--stopped due to GI upset).   Interval history: no cbg record, but states cbg's varied from 155-295.  There is no trend throughout the day.  She takes meds as rx'ed.  She requests pump and continuous glucose monitor.   Past Medical History:  Diagnosis Date  . Allergy   . Arthritis   . Asthma   . Chicken pox   . Diabetes mellitus   . Finger infection 2008   isolation  . GERD (gastroesophageal reflux disease)   . Heart murmur    benign per patient  . Hyperlipidemia   . Hypertension    past hx   . Ischemic optic neuropathy    right eye  . Migraine   . OSA (obstructive sleep apnea) 10/25/2016  . Osteoporosis   . Pericarditis   . Right sided sciatica 06/13/2017  . Sleep apnea    Patient is scheduled for sleep study tonight- wears cpap  . Viral meningitis     Past Surgical History:  Procedure Laterality Date  . CARDIAC CATHETERIZATION  2012  . CESAREAN SECTION     1997, 2000  . COLONOSCOPY    . KNEE ARTHROSCOPY Right 2010  . MENISCUS REPAIR Left 10/2017  . ORIF HUMERUS FRACTURE Right 03/25/2015   Procedure: OPEN REDUCTION INTERNAL FIXATION (ORIF) DISTAL HUMERUS FRACTURE;  Surgeon: Renette Butters, MD;  Location: [facility];  Service: Orthopedics;  Laterality: Right;  . POLYPECTOMY      Social History   Socioeconomic History  . Marital status: Divorced    Spouse name: Not on file  . Number of children: Not on file  . Years of education: Not on file  .  Highest education level: Not on file  Occupational History  . Not on file  Social Needs  . Financial resource strain: Not on file  . Food insecurity    Worry: Not on file    Inability: Not on file  . Transportation needs    Medical: Not on file    Non-medical: Not on file  Tobacco Use  . Smoking status: Never Smoker  . Smokeless tobacco: Never Used  Substance and Sexual Activity  . Alcohol use: Yes    Comment: once a year  . Drug use: No  . Sexual activity: Yes    Birth control/protection: Post-menopausal  Lifestyle  . Physical activity    Days per week: Not on file    Minutes per session: Not on file  . Stress: Not on file  Relationships  . Social Herbalist on phone: Not on file    Gets together: Not on file    Attends religious service: Not on file    Active member of club or organization: Not on file    Attends meetings of clubs or organizations: Not on file    Relationship status: Not on file  . Intimate partner violence    Fear of current or ex partner: Not on  file    Emotionally abused: Not on file    Physically abused: Not on file    Forced sexual activity: Not on file  Other Topics Concern  . Not on file  Social History Narrative  . Not on file    Current Outpatient Medications on File Prior to Visit  Medication Sig Dispense Refill  . albuterol (PROAIR HFA) 108 (90 Base) MCG/ACT inhaler Inhale 2 puffs into the lungs as needed. 18 g 3  . aspirin EC 81 MG tablet Take 81 mg by mouth daily.    . Aspirin-Acetaminophen-Caffeine (EXCEDRIN EXTRA STRENGTH PO) Take by mouth as needed.    Marland Kitchen atorvastatin (LIPITOR) 80 MG tablet TAKE 1 TABLET BY MOUTH EVERYDAY AT BEDTIME 90 tablet 4  . BAYER CONTOUR NEXT TEST test strip USE TO TEST FOUR TIMES A DAY AS INSTRUCTED.  12  . CONTOUR NEXT TEST test strip USE TO TEST 4 TIMES A DAY AS INSTRUCTED 300 each 1  . diphenhydrAMINE (BENADRYL) 25 MG tablet Take 25 mg by mouth every 6 (six) hours as needed for allergies.      Marland Kitchen EPINEPHRINE 0.3 mg/0.3 mL IJ SOAJ injection INJECT 0.3 ML INTO THE MUSCLE ONCE 2 mL 0  . Fluticasone-Salmeterol (ADVAIR DISKUS) 250-50 MCG/DOSE AEPB Inhale 1 puff into the lungs 2 (two) times daily. 60 each 5  . Insulin Pen Needle (BD PEN NEEDLE NANO U/F) 32G X 4 MM MISC USE AS DIRECTED 4 TIMES A DAY 390 each 3  . liraglutide (VICTOZA) 18 MG/3ML SOPN INJECT 0.3 MLS (1.8 MG TOTAL) INTO THE SKIN DAILY. 27 pen 0  . montelukast (SINGULAIR) 10 MG tablet Take 1 tablet (10 mg total) by mouth at bedtime. 90 tablet 1  . naproxen (NAPROSYN) 500 MG tablet Take 1 tablet (500 mg total) by mouth 2 (two) times daily. 60 tablet 1  . traMADol (ULTRAM) 50 MG tablet Take 2 tablets (100 mg total) by mouth every 6 (six) hours as needed for moderate pain. 60 tablet 0  . Vitamin D, Ergocalciferol, (DRISDOL) 1.25 MG (50000 UT) CAPS capsule Take 1 capsule (50,000 Units total) by mouth every 7 (seven) days. 12 capsule 0   No current facility-administered medications on file prior to visit.       Family History  Problem Relation Age of Onset  . Colon polyps Mother   . Diabetes Mother   . Heart failure Mother   . Peripheral vascular disease Mother   . Diabetes Father   . Heart disease Father   . Arthritis Father   . Hypertension Father   . Heart failure Father   . Prostate cancer Father   . Arthritis Other   . Hyperlipidemia Other   . Heart disease Other   . Hypertension Other   . Depression Other   . Ovarian cancer Other   . Uterine cancer Other   . Breast cancer Other   . Prostate cancer Other   . Breast cancer Cousin   . Colon cancer Neg Hx   . Stomach cancer Neg Hx   . Rectal cancer Neg Hx   . Pancreatic cancer Neg Hx   . Esophageal cancer Neg Hx     BP 134/76 (BP Location: Right Arm, Patient Position: Sitting, Cuff Size: Large)   Pulse 97   Ht 4\' 10"  (1.473 m)   Wt 177 lb 9.6 oz (80.6 kg)   SpO2 98%   BMI 37.12 kg/m   Review of Systems She denies hypoglycemia.  She has gained  1 lb  since last ov.      Objective:   Physical Exam VITAL SIGNS:  See vs page GENERAL: no distress Pulses: dorsalis pedis intact bilat.   MSK: no deformity of the feet CV: no leg edema.   Skin:  no ulcer on the feet, but she skin is dry.  normal color and temp on the feet.  Neuro: sensation is intact to touch on the feet.    Lab Results  Component Value Date   HGBA1C 9.5 (A) 05/16/2019      Assessment & Plan:  Insulin-requiring type 2 DM: worse. Obesity: pt requests this be considered in her rx.   Patient Instructions  Please increase the Tresiba to 200 units daily. This is a very slow time-release insulin, so you can take it at any time of day.   I have sent a prescription to your pharmacy, to add "Farxiga."   check your blood sugar once a day.  vary the time of day when you check, between before the 3 meals, and at bedtime.  also check if you have symptoms of your blood sugar being too high or too low.  please keep a record of the readings and bring it to your next appointment here (or you can bring the meter itself).  You can write it on any piece of paper.  please call us sooner if your blood sugar goes below 70, or if you have a lot of readings over 200. Please come back for a follow-up appointment in 2 months.

## 2019-05-17 ENCOUNTER — Ambulatory Visit: Payer: Self-pay | Admitting: *Deleted

## 2019-05-17 ENCOUNTER — Other Ambulatory Visit: Payer: Self-pay

## 2019-05-17 DIAGNOSIS — E119 Type 2 diabetes mellitus without complications: Secondary | ICD-10-CM

## 2019-05-17 MED ORDER — TRESIBA FLEXTOUCH 200 UNIT/ML ~~LOC~~ SOPN: 200.0000 [IU] | PEN_INJECTOR | Freq: Every day | SUBCUTANEOUS | 11 refills | Status: DC

## 2019-05-17 NOTE — Telephone Encounter (Signed)
  Patient is calling to report she has been having daily, light - easy to stop nose bleeds. Patient does have a history of cauterization years ago. Patient believes her air is too dry. Call to office to schedule appointment. Reason for Disposition . Hard-to-stop nosebleeds are a chronic symptom (recurrent or ongoing AND present > 4 weeks)    Patient states she is having daily nose bleeds- she is able to stop them quickly- but they are daily. Patient states in the past she has had to have cauterization but that was years ago and she was bleeding a lot more.  Answer Assessment - Initial Assessment Questions 1. AMOUNT OF BLEEDING: "How bad is the bleeding?" "How much blood was lost?" "Has the bleeding stopped?"   - MILD: needed a couple tissues   - MODERATE: needed many tissues   - SEVERE: large blood clots, soaked many tissues, lasted more than 30 minutes      Mild with clotting- bleeding has stopped now 2. ONSET: "When did the nosebleed start?"      11:00 am 3. FREQUENCY: "How many nosebleeds have you had in the last 24 hours?"      2 4. RECURRENT SYMPTOMS: "Have there been other recent nosebleeds?" If so, ask: "How long did it take you to stop the bleeding?" "What worked best?"      Yes- having daily for 2 weeks, less 10-15 minutes, appling pressure and head down 5. CAUSE: "What do you think caused this nosebleed?"     Dry air, allergy 6. LOCAL FACTORS: "Do you have any cold symptoms?", "Have you been rubbing or picking at your nose?"     No - not really 7. SYSTEMIC FACTORS: "Do you have high blood pressure or any bleeding problems?"     No 8. BLOOD THINNERS: "Do you take any blood thinners?" (e.g., coumadin, heparin, aspirin, Plavix)     Asprin 81 mg 9. OTHER SYMPTOMS: "Do you have any other symptoms?" (e.g., lightheadedness)     feels fine 10. PREGNANCY: "Is there any chance you are pregnant?" "When was your last menstrual period?"       n/a  Protocols used: [id number]

## 2019-05-17 NOTE — Telephone Encounter (Signed)
See note

## 2019-05-18 NOTE — Telephone Encounter (Signed)
Please advise. Pt has an appointment 12/9. Should she have an acute visit sooner than that?

## 2019-05-18 NOTE — Telephone Encounter (Signed)
No need for sooner appointment. Advise moist air humidifier to run in bedroom at nighttime. If uncontrollable nosebleed will need ER visit.

## 2019-05-23 ENCOUNTER — Ambulatory Visit: Payer: BC Managed Care – PPO | Admitting: Internal Medicine

## 2019-05-23 DIAGNOSIS — N907 Vulvar cyst: Secondary | ICD-10-CM | POA: Diagnosis not present

## 2019-05-23 DIAGNOSIS — Z6836 Body mass index (BMI) 36.0-36.9, adult: Secondary | ICD-10-CM | POA: Diagnosis not present

## 2019-05-28 ENCOUNTER — Telehealth: Payer: Self-pay | Admitting: Internal Medicine

## 2019-05-28 NOTE — Telephone Encounter (Signed)
Patient called and would like to talk to Dr. Jerilee Hoh or her CMA about getting the covid vaccine since she is expose to it at her work. Please call patient back,thanks.

## 2019-05-28 NOTE — Telephone Encounter (Signed)
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 Please advise.

## 2019-05-28 NOTE — Telephone Encounter (Signed)
Medication Refill - Medication: EPINEPHRINE 0.3 mg/0.3 mL IJ SOAJ injection  Has the patient contacted their pharmacy? Yes.   (Agent: If no, request that the patient contact the pharmacy for the refill.) (Agent: If yes, when and what did the pharmacy advise?)  Preferred Pharmacy (with phone number or street name): [id number] - [place], [address]  Agent: Please be advised that RX refills may take up to 3 business days. We ask that you follow-up with your pharmacy.

## 2019-05-28 NOTE — Telephone Encounter (Signed)
Message routed to PCP CMA  

## 2019-05-29 ENCOUNTER — Telehealth: Payer: Self-pay | Admitting: Nutrition

## 2019-05-29 MED ORDER — EPINEPHRINE 0.3 MG/0.3ML IJ SOAJ: INTRAMUSCULAR | 0 refills | Status: AC

## 2019-05-29 NOTE — Telephone Encounter (Signed)
Once vaccines are available, the public shall be notified. [place]

## 2019-05-29 NOTE — Telephone Encounter (Signed)
Patient notified and verbalized understanding. 

## 2019-05-29 NOTE — Telephone Encounter (Signed)
Message left on my machine saying she has not heard back on the status of the request for her new pump order.  I phoned her back and left a message with the pump representative's name and number.

## 2019-05-29 NOTE — Telephone Encounter (Signed)
Refill sent.

## 2019-06-01 ENCOUNTER — Telehealth: Payer: Self-pay

## 2019-06-01 NOTE — Telephone Encounter (Signed)
Company: [employer] Document: last 2 office notes, labs, RX and CMN Other records requested: as indicated above  All above requested information has been faxed successfully to [employer] listed above. Documents and fax confirmation have been placed in the faxed file for future reference.

## 2019-06-04 DIAGNOSIS — Z20828 Contact with and (suspected) exposure to other viral communicable diseases: Secondary | ICD-10-CM | POA: Diagnosis not present

## 2019-06-12 ENCOUNTER — Other Ambulatory Visit: Payer: Self-pay | Admitting: Internal Medicine

## 2019-06-12 DIAGNOSIS — J4540 Moderate persistent asthma, uncomplicated: Secondary | ICD-10-CM

## 2019-06-12 DIAGNOSIS — J9801 Acute bronchospasm: Secondary | ICD-10-CM

## 2019-06-18 ENCOUNTER — Telehealth: Payer: Self-pay

## 2019-06-18 DIAGNOSIS — Z20822 Contact with and (suspected) exposure to covid-19: Secondary | ICD-10-CM | POA: Diagnosis not present

## 2019-06-18 DIAGNOSIS — Z20828 Contact with and (suspected) exposure to other viral communicable diseases: Secondary | ICD-10-CM | POA: Diagnosis not present

## 2019-06-18 DIAGNOSIS — Z03818 Encounter for observation for suspected exposure to other biological agents ruled out: Secondary | ICD-10-CM | POA: Diagnosis not present

## 2019-06-18 NOTE — Telephone Encounter (Signed)
Company: [employer]  Document: DWO for insulin pump, supplies and CGM Other records requested: None requested  All above requested information has been faxed successfully to [employer] listed above. Documents and fax confirmation have been placed in the faxed file for future reference.

## 2019-06-25 ENCOUNTER — Telehealth: Payer: Self-pay | Admitting: Endocrinology

## 2019-06-25 NOTE — Telephone Encounter (Signed)
Patient has called and would like to get the [facility] and all accompanying items instead of getting insulin pump since her insurance company won't approve due to lack of testing  Call back #  - [phone]

## 2019-06-25 NOTE — Telephone Encounter (Signed)
Are you able to help with this patient's request?

## 2019-06-27 ENCOUNTER — Other Ambulatory Visit: Payer: Self-pay

## 2019-06-27 DIAGNOSIS — E119 Type 2 diabetes mellitus without complications: Secondary | ICD-10-CM

## 2019-06-27 MED ORDER — DEXCOM G6 TRANSMITTER MISC: 1.0000 | 2 refills | Status: DC

## 2019-06-27 MED ORDER — DEXCOM G6 SENSOR MISC: 1.0000 | 2 refills | Status: DC

## 2019-06-27 MED ORDER — DEXCOM G6 RECEIVER DEVI: 1.0000 | 0 refills | Status: DC

## 2019-06-27 NOTE — Telephone Encounter (Signed)
All Rx's sent as requested:  E-Prescribing Status: Receipt confirmed by pharmacy (06/27/2019  3:22 PM EST)

## 2019-07-03 ENCOUNTER — Telehealth: Payer: Self-pay

## 2019-07-03 DIAGNOSIS — H527 Unspecified disorder of refraction: Secondary | ICD-10-CM | POA: Diagnosis not present

## 2019-07-03 DIAGNOSIS — H43813 Vitreous degeneration, bilateral: Secondary | ICD-10-CM | POA: Diagnosis not present

## 2019-07-03 DIAGNOSIS — H25813 Combined forms of age-related cataract, bilateral: Secondary | ICD-10-CM | POA: Diagnosis not present

## 2019-07-03 DIAGNOSIS — H47011 Ischemic optic neuropathy, right eye: Secondary | ICD-10-CM | POA: Diagnosis not present

## 2019-07-03 NOTE — Telephone Encounter (Signed)
Company: [employer]  Document: PA request form Other records requested: None requested  All above requested information has been faxed successfully to [employer] listed above. Documents and fax confirmation have been placed in the faxed file for future reference.

## 2019-07-05 NOTE — Telephone Encounter (Signed)
APPROVAL  Received notification from [facility] that PA for Dexcom has been approved 07/03/19 through 06/13/2038. Documents have been labeled and placed in scan file for HIM and for our future reference.

## 2019-07-06 DIAGNOSIS — Z1231 Encounter for screening mammogram for malignant neoplasm of breast: Secondary | ICD-10-CM | POA: Diagnosis not present

## 2019-07-06 DIAGNOSIS — Z6837 Body mass index (BMI) 37.0-37.9, adult: Secondary | ICD-10-CM | POA: Diagnosis not present

## 2019-07-06 DIAGNOSIS — Z124 Encounter for screening for malignant neoplasm of cervix: Secondary | ICD-10-CM | POA: Diagnosis not present

## 2019-07-06 DIAGNOSIS — Z01419 Encounter for gynecological examination (general) (routine) without abnormal findings: Secondary | ICD-10-CM | POA: Diagnosis not present

## 2019-07-11 NOTE — Telephone Encounter (Signed)
Renee Lam from [employer] called to state she received a fax for Dexcom sensors and the receiver but the fax was missing 3 pages-please fax again at [phone] so that she can move forward with this request-good call back number is [phone] can submit the clinical notes verbally

## 2019-07-13 ENCOUNTER — Telehealth: Payer: Self-pay | Admitting: Internal Medicine

## 2019-07-13 DIAGNOSIS — R21 Rash and other nonspecific skin eruption: Secondary | ICD-10-CM

## 2019-07-13 NOTE — Telephone Encounter (Signed)
Referral placed.

## 2019-07-13 NOTE — Telephone Encounter (Signed)
pt has a spot on her arm  for about one month it  has some discoloration;  pt would like Dr to advise call back number  [phone]

## 2019-07-13 NOTE — Telephone Encounter (Signed)
Lilia Pro from [employer] calling to give approval info for [facility] sensor and receiver; Sensor approval [id number] good 07/11/19-06/13/38 Receiver approval # is [id number] good 07/11/19-06/13/38  FYI

## 2019-07-17 DIAGNOSIS — L300 Nummular dermatitis: Secondary | ICD-10-CM | POA: Diagnosis not present

## 2019-07-17 DIAGNOSIS — L820 Inflamed seborrheic keratosis: Secondary | ICD-10-CM | POA: Diagnosis not present

## 2019-07-18 ENCOUNTER — Telehealth: Payer: Self-pay

## 2019-07-18 ENCOUNTER — Other Ambulatory Visit: Payer: Self-pay

## 2019-07-18 DIAGNOSIS — E119 Type 2 diabetes mellitus without complications: Secondary | ICD-10-CM

## 2019-07-18 MED ORDER — DEXCOM G6 SENSOR MISC: 1.0000 | 2 refills | Status: DC

## 2019-07-18 MED ORDER — DEXCOM G6 RECEIVER DEVI: 1.0000 | 0 refills | Status: DC

## 2019-07-18 MED ORDER — DEXCOM G6 TRANSMITTER MISC: 1.0000 | 2 refills | Status: DC

## 2019-07-18 NOTE — Telephone Encounter (Signed)
Received notification from [facility] indicating that Dexcom was approved and can be sent to pt [facility]:  [facility]  [id number] - [place], [address] [place] [address]  Phone:  [phone] Fax:  [phone]    Outpatient Medication Detail   Disp Refills Start End   Continuous Blood Gluc Receiver ([place]) [place] 1 each 0 07/18/2019    Sig - Route: 1 each by Does not apply route See admin instructions. For continuous glucose monitoring; E11.9 - Does not apply   Sent to pharmacy as: Continuous Blood Gluc Receiver ([place]) Device   E-Prescribing Status: Receipt confirmed by pharmacy (07/18/2019 2:29 PM EST)    Cosign Tracking Order Transmittal Tracking  Outpatient Medication Detail   Disp Refills Start End   Continuous Blood Gluc Sensor (DEXCOM G6 SENSOR) MISC 3 each 2 07/18/2019    Sig - Route: 1 each by Does not apply route See admin instructions. For use with continuous glucose monitoring system. Change sensor every 10 days; E11.9 - Does not apply   Sent to pharmacy as: Continuous Blood Gluc Sensor (DEXCOM G6 SENSOR) Misc   E-Prescribing Status: Receipt confirmed by pharmacy (07/18/2019 2:29 PM EST)    Outpatient Medication Detail   Disp Refills Start End   Continuous Blood Gluc Transmit (DEXCOM G6 TRANSMITTER) MISC 1 each 2 07/18/2019    Sig - Route: 1 each by Does not apply route See admin instructions. For continuous glucose monitoring; E11.9 - Does not apply   Sent to pharmacy as: Continuous Blood Gluc Transmit (DEXCOM G6 TRANSMITTER) Misc   E-Prescribing Status: Receipt confirmed by pharmacy (07/18/2019 2:29 PM EST)

## 2019-07-20 ENCOUNTER — Ambulatory Visit (INDEPENDENT_AMBULATORY_CARE_PROVIDER_SITE_OTHER): Payer: BC Managed Care – PPO | Admitting: Endocrinology

## 2019-07-20 ENCOUNTER — Other Ambulatory Visit: Payer: Self-pay

## 2019-07-20 ENCOUNTER — Encounter: Payer: Self-pay | Admitting: Endocrinology

## 2019-07-20 VITALS — BP 120/70 | HR 93 | Ht <= 58 in | Wt 176.8 lb

## 2019-07-20 DIAGNOSIS — E119 Type 2 diabetes mellitus without complications: Secondary | ICD-10-CM

## 2019-07-20 LAB — POCT GLYCOSYLATED HEMOGLOBIN (HGB A1C): Hemoglobin A1C: 8 % — AB (ref 4.0–5.6)

## 2019-07-20 MED ORDER — TRESIBA FLEXTOUCH 200 UNIT/ML ~~LOC~~ SOPN: 220.0000 [IU] | PEN_INJECTOR | Freq: Every day | SUBCUTANEOUS | 11 refills | Status: DC

## 2019-07-20 NOTE — Patient Instructions (Addendum)
Please increase the Tresiba to 220 units daily. This is a very slow time-release insulin, so you can take it at any time of day.   Please continue the same other diabetes medications check your blood sugar once a day.  vary the time of day when you check, between before the 3 meals, and at bedtime.  also check if you have symptoms of your blood sugar being too high or too low.  please keep a record of the readings and bring it to your next appointment here (or you can bring the meter itself).  You can write it on any piece of paper.  please call us sooner if your blood sugar goes below 70, or if you have a lot of readings over 200. Please come back for a follow-up appointment in 2 months.

## 2019-07-20 NOTE — Progress Notes (Signed)
Subjective:    Patient ID: Renee Lam, female    DOB: [birth year], 59 y.o.   MRN: [id number]  HPI Pt returns for f/u of diabetes mellitus: DM type: Insulin-requiring type 2.  Dx'ed: [id number] Complications: DR.  Therapy: insulin since 2010, Farxiga, and victoza.  GDM: 1996 and 1999.  DKA: never.  Severe hypoglycemia: never.     Pancreatitis: never.   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  Interval history: no cbg record, but states cbg's varied from 130-180.  There is no trend throughout the day.  She takes meds as rx'ed.  She just received new continuous glucose monitor.  Past Medical History:  Diagnosis Date  . Allergy   . Arthritis   . Asthma   . Chicken pox   . Diabetes mellitus   . Finger infection 2008   isolation  . GERD (gastroesophageal reflux disease)   . Heart murmur    benign per patient  . Hyperlipidemia   . Hypertension    past hx   . Ischemic optic neuropathy    right eye  . Migraine   . OSA (obstructive sleep apnea) 10/25/2016  . Osteoporosis   . Pericarditis   . Right sided sciatica 06/13/2017  . Sleep apnea    Patient is scheduled for sleep study tonight- wears cpap  . Viral meningitis     Past Surgical History:  Procedure Laterality Date  . CARDIAC CATHETERIZATION  2012  . CESAREAN SECTION     1997, 2000  . COLONOSCOPY    . KNEE ARTHROSCOPY Right 2010  . MENISCUS REPAIR Left 10/2017  . ORIF HUMERUS FRACTURE Right 03/25/2015   Procedure: OPEN REDUCTION INTERNAL FIXATION (ORIF) DISTAL HUMERUS FRACTURE;  Surgeon: Renette Butters, MD;  Location: [place];  Service: Orthopedics;  Laterality: Right;  . POLYPECTOMY      Social History   Socioeconomic History  . Marital status: Divorced    Spouse name: Not on file  . Number of children: Not on file  . Years  of education: Not on file  . Highest education level: Not on file  Occupational History  . Not on file  Tobacco Use  . Smoking status: Never Smoker  . Smokeless tobacco: Never Used  Substance and Sexual Activity  . Alcohol use: Yes    Comment: once a year  . Drug use: No  . Sexual activity: Yes    Birth control/protection: Post-menopausal  Other Topics Concern  . Not on file  Social History Narrative  . Not on file   Social Determinants of Health   Financial Resource Strain:   . Difficulty of Paying Living Expenses: Not on file  Food Insecurity:   . Worried About Charity fundraiser in the Last Year: Not on file  . Ran Out of Food in the Last Year: Not on file  Transportation Needs:   . Lack of Transportation (Medical): Not on file  . Lack of Transportation (Non-Medical): Not on file  Physical Activity:   . Days of Exercise per Week: Not on file  . Minutes of Exercise per Session: Not on file  Stress:   . Feeling of Stress : Not on file  Social Connections:   . Frequency of Communication with Friends and Family: Not on file  . Frequency of Social Gatherings with Friends  and Family: Not on file  . Attends Religious Services: Not on file  . Active Member of Clubs or Organizations: Not on file  . Attends Archivist Meetings: Not on file  . Marital Status: Not on file  Intimate Partner Violence:   . Fear of Current or Ex-Partner: Not on file  . Emotionally Abused: Not on file  . Physically Abused: Not on file  . Sexually Abused: Not on file    Current Outpatient Medications on File Prior to Visit  Medication Sig Dispense Refill  . albuterol (PROAIR HFA) 108 (90 Base) MCG/ACT inhaler Inhale 2 puffs into the lungs as needed. 18 g 3  . aspirin EC 81 MG tablet Take 81 mg by mouth daily.    . Aspirin-Acetaminophen-Caffeine (EXCEDRIN EXTRA STRENGTH PO) Take by mouth as needed.    Marland Kitchen atorvastatin (LIPITOR) 80 MG tablet TAKE 1 TABLET BY MOUTH EVERYDAY AT BEDTIME 90  tablet 4  . BAYER CONTOUR NEXT TEST test strip USE TO TEST FOUR TIMES A DAY AS INSTRUCTED.  12  . CONTOUR NEXT TEST test strip USE TO TEST 4 TIMES A DAY AS INSTRUCTED 300 each 1  . dapagliflozin propanediol (FARXIGA) 10 MG TABS tablet Take 10 mg by mouth daily. 30 tablet 11  . diphenhydrAMINE (BENADRYL) 25 MG tablet Take 25 mg by mouth every 6 (six) hours as needed for allergies.     Marland Kitchen EPINEPHrine 0.3 mg/0.3 mL IJ SOAJ injection INJECT 0.3 ML INTO THE MUSCLE ONCE 2 mL 0  . Fluticasone-Salmeterol (ADVAIR DISKUS) 250-50 MCG/DOSE AEPB Inhale 1 puff into the lungs 2 (two) times daily. 60 each 5  . Insulin Pen Needle (BD PEN NEEDLE NANO U/F) 32G X 4 MM MISC USE AS DIRECTED 4 TIMES A DAY 390 each 3  . liraglutide (VICTOZA) 18 MG/3ML SOPN INJECT 0.3 MLS (1.8 MG TOTAL) INTO THE SKIN DAILY. 27 pen 0  . montelukast (SINGULAIR) 10 MG tablet TAKE 1 TABLET BY MOUTH EVERYDAY AT BEDTIME 90 tablet 1  . naproxen (NAPROSYN) 500 MG tablet Take 1 tablet (500 mg total) by mouth 2 (two) times daily. 60 tablet 1  . traMADol (ULTRAM) 50 MG tablet Take 2 tablets (100 mg total) by mouth every 6 (six) hours as needed for moderate pain. 60 tablet 0  . Vitamin D, Ergocalciferol, (DRISDOL) 1.25 MG (50000 UT) CAPS capsule Take 1 capsule (50,000 Units total) by mouth every 7 (seven) days. 12 capsule 0  . Continuous Blood Gluc Receiver ([place]) [place] 1 each by Does not apply route See admin instructions. For continuous glucose monitoring; E11.9 (Patient not taking: Reported on 07/20/2019) 1 each 0  . Continuous Blood Gluc Sensor (DEXCOM G6 SENSOR) MISC 1 each by Does not apply route See admin instructions. For use with continuous glucose monitoring system. Change sensor every 10 days; E11.9 (Patient not taking: Reported on 07/20/2019) 3 each 2  . Continuous Blood Gluc Transmit (DEXCOM G6 TRANSMITTER) MISC 1 each by Does not apply route See admin instructions. For continuous glucose monitoring; E11.9 (Patient not taking:  Reported on 07/20/2019) 1 each 2   No current facility-administered medications on file prior to visit.      Family History  Problem Relation Age of Onset  . Colon polyps Mother   . Diabetes Mother   . Heart failure Mother   . Peripheral vascular disease Mother   . Diabetes Father   . Heart disease Father   . Arthritis Father   . Hypertension Father   .  Heart failure Father   . Prostate cancer Father   . Arthritis Other   . Hyperlipidemia Other   . Heart disease Other   . Hypertension Other   . Depression Other   . Ovarian cancer Other   . Uterine cancer Other   . Breast cancer Other   . Prostate cancer Other   . Breast cancer Cousin   . Colon cancer Neg Hx   . Stomach cancer Neg Hx   . Rectal cancer Neg Hx   . Pancreatic cancer Neg Hx   . Esophageal cancer Neg Hx     BP 120/70   Pulse 93   Ht 4\' 10"  (1.473 m)   Wt 176 lb 12.8 oz (80.2 kg)   SpO2 98%   BMI 36.95 kg/m    Review of Systems She denies hypoglycemia    Objective:   Physical Exam VITAL SIGNS:  See vs page GENERAL: no distress Pulses: dorsalis pedis intact bilat.   MSK: no deformity of the feet CV: no leg edema Skin:  no ulcer on the feet.  normal color and temp on the feet. Neuro: sensation is intact to touch on the feet  A1c=8.0%     Assessment & Plan:  Insulin-requiring type 2 DM, with DR: she needs increased rx.    Patient Instructions  Please increase the Tresiba to 220 units daily. This is a very slow time-release insulin, so you can take it at any time of day.   Please continue the same other diabetes medications check your blood sugar once a day.  vary the time of day when you check, between before the 3 meals, and at bedtime.  also check if you have symptoms of your blood sugar being too high or too low.  please keep a record of the readings and bring it to your next appointment here (or you can bring the meter itself).  You can write it on any piece of paper.  please call us sooner  if your blood sugar goes below 70, or if you have a lot of readings over 200. Please come back for a follow-up appointment in 2 months.

## 2019-07-31 ENCOUNTER — Telehealth: Payer: Self-pay | Admitting: Nutrition

## 2019-07-31 NOTE — Telephone Encounter (Signed)
[employer] pump request forms faxed to [employer] for insurance verification and cost. On 05/16/19

## 2019-08-09 ENCOUNTER — Other Ambulatory Visit: Payer: Self-pay

## 2019-08-09 ENCOUNTER — Telehealth: Payer: Self-pay | Admitting: Endocrinology

## 2019-08-09 DIAGNOSIS — E119 Type 2 diabetes mellitus without complications: Secondary | ICD-10-CM

## 2019-08-09 MED ORDER — TRESIBA FLEXTOUCH 200 UNIT/ML ~~LOC~~ SOPN: 230.0000 [IU] | PEN_INJECTOR | Freq: Every day | SUBCUTANEOUS | 0 refills | Status: DC

## 2019-08-09 NOTE — Telephone Encounter (Signed)
Ok, please verify you are taking 220 units qd.  Then please increase to 230 units qd.  If you have problems with blood sugar being too high and too low, I would be happy to reconsider the mealtime insulin

## 2019-08-09 NOTE — Telephone Encounter (Signed)
We can add a small amount with meals.  However, wouldn't it be easier to just increase your current insulin?

## 2019-08-09 NOTE — Telephone Encounter (Signed)
Called pt and informed about Dr. Ellison's response below. Verbalized acceptance and understanding. 

## 2019-08-09 NOTE — Telephone Encounter (Signed)
Called pt to inquire further. States she is comfortable with whatever you feel is best for her as she states "you are a life saver". Please advise

## 2019-08-09 NOTE — Telephone Encounter (Signed)
Please advise 

## 2019-08-09 NOTE — Telephone Encounter (Signed)
Patient called asking if she could possibly start a short acting RX for while shes at work due to levels being very high while she is working. She was not sure if this was something she could do. Asks that Dr or Nurse can call her to advise. Ph# [phone]

## 2019-08-16 ENCOUNTER — Telehealth: Payer: Self-pay | Admitting: *Deleted

## 2019-08-16 NOTE — Telephone Encounter (Signed)
Patient called Nurse Triage. Patient reports she had the cartilage in her ear pierced x2 weeks ago. Now it is swollen, red and pus coming out when she squeezes it. Nurse triage reached out to patient twice and note forwarded to [facility] office.   Clinic RN reached out to patient. Patient reports this just started this morning. Offered patient in office appointment for 11:30AM. Patient declined reporting she just got off work and would like to come tomorrow. In office appointment made for 08/17/2019 at 10:45 AM. Informed patient if sx worse she needs to go to an urgent care. Patient verbalized understanding.

## 2019-08-17 ENCOUNTER — Ambulatory Visit (INDEPENDENT_AMBULATORY_CARE_PROVIDER_SITE_OTHER): Payer: BC Managed Care – PPO | Admitting: Family Medicine

## 2019-08-17 ENCOUNTER — Other Ambulatory Visit: Payer: Self-pay

## 2019-08-17 ENCOUNTER — Encounter: Payer: Self-pay | Admitting: Family Medicine

## 2019-08-17 VITALS — BP 120/62 | HR 89 | Temp 97.3°F | Wt 181.6 lb

## 2019-08-17 DIAGNOSIS — H6012 Cellulitis of left external ear: Secondary | ICD-10-CM

## 2019-08-17 MED ORDER — DOXYCYCLINE HYCLATE 100 MG PO CAPS: 100.0000 mg | ORAL_CAPSULE | Freq: Two times a day (BID) | ORAL | 0 refills | Status: AC

## 2019-08-17 NOTE — Progress Notes (Signed)
   Subjective:    Patient ID: Renee Lam, female    DOB: Sep 03, 1960, 59 y.o.   MRN: [id number]  HPI Here for pain and swelling in the left earlobe for 2 days. She had 2 piercings through the upper lobe about 2 weeks ago. These are through the cartilage, which she has never had before. She has been cleaning the lobe with alcohol. No fever.    Review of Systems  Constitutional: Negative.   HENT: Positive for ear pain.   Respiratory: Negative.   Cardiovascular: Negative.        Objective:   Physical Exam Constitutional:      Appearance: Normal appearance.  HENT:     Right Ear: Tympanic membrane, ear canal and external ear normal.     Left Ear: Tympanic membrane and ear canal normal.     Ears:     Comments: Left upper ear lobe has 2 metal piercings in place. The surrounding area is red, warm, swollen, and tender. No regional adenopathy Cardiovascular:     Rate and Rhythm: Normal rate and regular rhythm.     Pulses: Normal pulses.     Heart sounds: Normal heart sounds.  Pulmonary:     Effort: Pulmonary effort is normal.     Breath sounds: Normal breath sounds.  Neurological:     Mental Status: She is alert.           Assessment & Plan:  Cellulitis of the left ear lobe. Both piercings were removed and the sites cleaned with gauze. Treat with Doxycycline for 10 days. Recheck prn. Alysia Penna, MD

## 2019-09-20 ENCOUNTER — Ambulatory Visit (INDEPENDENT_AMBULATORY_CARE_PROVIDER_SITE_OTHER): Payer: BC Managed Care – PPO | Admitting: Endocrinology

## 2019-09-20 ENCOUNTER — Encounter: Payer: Self-pay | Admitting: Endocrinology

## 2019-09-20 ENCOUNTER — Other Ambulatory Visit: Payer: Self-pay

## 2019-09-20 VITALS — BP 120/80 | HR 90 | Ht <= 58 in | Wt 179.0 lb

## 2019-09-20 DIAGNOSIS — E119 Type 2 diabetes mellitus without complications: Secondary | ICD-10-CM | POA: Diagnosis not present

## 2019-09-20 LAB — POCT GLYCOSYLATED HEMOGLOBIN (HGB A1C): Hemoglobin A1C: 6.9 % — AB (ref 4.0–5.6)

## 2019-09-20 NOTE — Progress Notes (Signed)
Subjective:    Patient ID: Renee Lam, female    DOB: [birth year], 59 y.o.   MRN: [id number]  HPI Pt returns for f/u of diabetes mellitus: DM type: Insulin-requiring type 2.  Dx'ed: [id number] Complications: DR.  Therapy: insulin since 2010, Farxiga, and Victoza.  GDM: 1996 and 1999.  DKA: never.  Severe hypoglycemia: never.     Pancreatitis: never.   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  Interval history:  She says it it is higher on work days than on days off.  There is no trend throughout the day.  She takes meds as rx'ed.  She just received new continuous glucose monitor data. Glucose varies from 75-200.  It is in general higher PC than AC.   Past Medical History:  Diagnosis Date  . Allergy   . Arthritis   . Asthma   . Chicken pox   . Diabetes mellitus   . Finger infection 2008   isolation  . GERD (gastroesophageal reflux disease)   . Heart murmur    benign per patient  . Hyperlipidemia   . Hypertension    past hx   . Ischemic optic neuropathy    right eye  . Migraine   . OSA (obstructive sleep apnea) 10/25/2016  . Osteoporosis   . Pericarditis   . Right sided sciatica 06/13/2017  . Sleep apnea    Patient is scheduled for sleep study tonight- wears cpap  . Viral meningitis     Past Surgical History:  Procedure Laterality Date  . CARDIAC CATHETERIZATION  2012  . CESAREAN SECTION     1997, 2000  . COLONOSCOPY    . KNEE ARTHROSCOPY Right 2010  . MENISCUS REPAIR Left 10/2017  . ORIF HUMERUS FRACTURE Right 03/25/2015   Procedure: OPEN REDUCTION INTERNAL FIXATION (ORIF) DISTAL HUMERUS FRACTURE;  Surgeon: Renette Butters, MD;  Location: [place];  Service: Orthopedics;  Laterality: Right;  . POLYPECTOMY      Social History   Socioeconomic History  . Marital status: Divorced      Spouse name: Not on file  . Number of children: Not on file  . Years of education: Not on file  . Highest education level: Not on file  Occupational History  . Not on file  Tobacco Use  . Smoking status: Never Smoker  . Smokeless tobacco: Never Used  Substance and Sexual Activity  . Alcohol use: Yes    Comment: once a year  . Drug use: No  . Sexual activity: Yes    Birth control/protection: Post-menopausal  Other Topics Concern  . Not on file  Social History Narrative  . Not on file   Social Determinants of Health   Financial Resource Strain:   . Difficulty of Paying Living Expenses:   Food Insecurity:   . Worried About Charity fundraiser in the Last Year:   . Arboriculturist in the Last Year:   Transportation Needs:   . Film/video editor (Medical):   Marland Kitchen Lack of Transportation (Non-Medical):   Physical Activity:   . Days of Exercise per Week:   . Minutes of Exercise per Session:   Stress:   . Feeling of Stress :   Social Connections:   . Frequency of Communication with Friends and Family:   . Frequency of Social  Gatherings with Friends and Family:   . Attends Religious Services:   . Active Member of Clubs or Organizations:   . Attends Archivist Meetings:   Marland Kitchen Marital Status:   Intimate Partner Violence:   . Fear of Current or Ex-Partner:   . Emotionally Abused:   Marland Kitchen Physically Abused:   . Sexually Abused:     Current Outpatient Medications on File Prior to Visit  Medication Sig Dispense Refill  . albuterol (PROAIR HFA) 108 (90 Base) MCG/ACT inhaler Inhale 2 puffs into the lungs as needed. 18 g 3  . aspirin EC 81 MG tablet Take 81 mg by mouth daily.    . Aspirin-Acetaminophen-Caffeine (EXCEDRIN EXTRA STRENGTH PO) Take by mouth as needed.    Marland Kitchen atorvastatin (LIPITOR) 80 MG tablet TAKE 1 TABLET BY MOUTH EVERYDAY AT BEDTIME 90 tablet 4  . BAYER CONTOUR NEXT TEST test strip USE TO TEST FOUR TIMES A DAY AS INSTRUCTED.  12  . Continuous Blood Gluc  Receiver ([place]) [place] 1 each by Does not apply route See admin instructions. For continuous glucose monitoring; E11.9 1 each 0  . Continuous Blood Gluc Sensor (DEXCOM G6 SENSOR) MISC 1 each by Does not apply route See admin instructions. For use with continuous glucose monitoring system. Change sensor every 10 days; E11.9 3 each 2  . Continuous Blood Gluc Transmit (DEXCOM G6 TRANSMITTER) MISC 1 each by Does not apply route See admin instructions. For continuous glucose monitoring; E11.9 1 each 2  . CONTOUR NEXT TEST test strip USE TO TEST 4 TIMES A DAY AS INSTRUCTED 300 each 1  . dapagliflozin propanediol (FARXIGA) 10 MG TABS tablet Take 10 mg by mouth daily. 30 tablet 11  . diphenhydrAMINE (BENADRYL) 25 MG tablet Take 25 mg by mouth every 6 (six) hours as needed for allergies.     Marland Kitchen EPINEPHrine 0.3 mg/0.3 mL IJ SOAJ injection INJECT 0.3 ML INTO THE MUSCLE ONCE 2 mL 0  . Fluticasone-Salmeterol (ADVAIR DISKUS) 250-50 MCG/DOSE AEPB Inhale 1 puff into the lungs 2 (two) times daily. 60 each 5  . Insulin Degludec (TRESIBA FLEXTOUCH) 200 UNIT/ML SOPN Inject 230 Units into the skin daily. 103.5 mL 0  . Insulin Pen Needle (BD PEN NEEDLE NANO U/F) 32G X 4 MM MISC USE AS DIRECTED 4 TIMES A DAY 390 each 3  . liraglutide (VICTOZA) 18 MG/3ML SOPN INJECT 0.3 MLS (1.8 MG TOTAL) INTO THE SKIN DAILY. 27 pen 0  . montelukast (SINGULAIR) 10 MG tablet TAKE 1 TABLET BY MOUTH EVERYDAY AT BEDTIME 90 tablet 1  . naproxen (NAPROSYN) 500 MG tablet Take 1 tablet (500 mg total) by mouth 2 (two) times daily. 60 tablet 1  . traMADol (ULTRAM) 50 MG tablet Take 2 tablets (100 mg total) by mouth every 6 (six) hours as needed for moderate pain. 60 tablet 0  . Vitamin D, Ergocalciferol, (DRISDOL) 1.25 MG (50000 UT) CAPS capsule Take 1 capsule (50,000 Units total) by mouth every 7 (seven) days. 12 capsule 0   No current facility-administered medications on file prior to visit.      Family History  Problem Relation  Age of Onset  . Colon polyps Mother   . Diabetes Mother   . Heart failure Mother   . Peripheral vascular disease Mother   . Diabetes Father   . Heart disease Father   . Arthritis Father   . Hypertension Father   . Heart failure Father   . Prostate cancer Father   .  Arthritis Other   . Hyperlipidemia Other   . Heart disease Other   . Hypertension Other   . Depression Other   . Ovarian cancer Other   . Uterine cancer Other   . Breast cancer Other   . Prostate cancer Other   . Breast cancer Cousin   . Colon cancer Neg Hx   . Stomach cancer Neg Hx   . Rectal cancer Neg Hx   . Pancreatic cancer Neg Hx   . Esophageal cancer Neg Hx     BP 120/80   Pulse 90   Ht 4\' 10"  (1.473 m)   Wt 179 lb (81.2 kg)   SpO2 98%   BMI 37.41 kg/m    Review of Systems Denies LOC    Objective:   Physical Exam VITAL SIGNS:  See vs page GENERAL: no distress Pulses: dorsalis pedis intact bilat.   MSK: no deformity of the feet CV: no leg edema Skin:  no ulcer on the feet.  normal color and temp on the feet. Neuro: sensation is intact to touch on the feet   Lab Results  Component Value Date   HGBA1C 6.9 (A) 09/20/2019       Assessment & Plan:  Insulin-requiring type 2 DM, with DR: overcontrolled, given this regimen, which does match insulin to her changing needs throughout the day occupational status: she needs to adjust insulin for this Obesity: persistent  Patient Instructions  Please continue the same [place].  However, on days off, take just 210 units.  This is a very slow time-release insulin, so you can take it at any time of day.   Please continue the same other diabetes medications.   check your blood sugar once a day.  vary the time of day when you check, between before the 3 meals, and at bedtime.  also check if you have symptoms of your blood sugar being too high or too low.  please keep a record of the readings and bring it to your next appointment here (or you can bring  the meter itself).  You can write it on any piece of paper.  please call us sooner if your blood sugar goes below 70, or if you have a lot of readings over 200. Please come back for a follow-up appointment in 3 months.     Bariatric Surgery You have so much to gain by losing weight.  You may have already tried every diet and exercise plan imaginable.  And, you may have sought advice from your family physician, too.   Sometimes, in spite of such diligent efforts, you may not be able to achieve long-term results by yourself.  In cases of severe obesity, bariatric or weight loss surgery is a proven method of achieving long-term weight control.  Our Services Our bariatric surgery programs offer our patients new hope and long-term weight-loss solution.  Since introducing our services in 2003, we have conducted more than 2,400 successful procedures.  Our program is designated as a Programmer, multimedia by the Metabolic and Bariatric Surgery Accreditation and Quality Improvement Program (MBSAQIP), a IT trainer that sets rigorous patient safety and outcome standards.  Our program is also designated as a Ecologist by [employer].   Our exceptional weight-loss surgery team specializes in diagnosis, treatment, follow-up care, and ongoing support for our patients with severe weight loss challenges.  We currently offer laparoscopic sleeve gastrectomy, gastric bypass, and adjustable gastric band (LAP-BAND).    Attend our [employer]  Choosing to undergo a bariatric procedure is a big decision, and one that should not be taken lightly.  You now have two options in how you learn about weight-loss surgery - in person or online.  Our objective is to ensure you have all of the information that you need to evaluate the advantages and obligations of this life changing procedure.  Please note that you are not alone in this process, and our experienced team is ready to assist and  answer all of your questions.  There are several ways to register for a seminar (either on-line or in person): 1)  Call [phone] 2) Go on-line to [employer] and register for either type of seminar.  [email or web address]

## 2019-09-20 NOTE — Patient Instructions (Addendum)
Please continue the same [place].  However, on days off, take just 210 units.  This is a very slow time-release insulin, so you can take it at any time of day.   Please continue the same other diabetes medications.   check your blood sugar once a day.  vary the time of day when you check, between before the 3 meals, and at bedtime.  also check if you have symptoms of your blood sugar being too high or too low.  please keep a record of the readings and bring it to your next appointment here (or you can bring the meter itself).  You can write it on any piece of paper.  please call us sooner if your blood sugar goes below 70, or if you have a lot of readings over 200. Please come back for a follow-up appointment in 3 months.     Bariatric Surgery You have so much to gain by losing weight.  You may have already tried every diet and exercise plan imaginable.  And, you may have sought advice from your family physician, too.   Sometimes, in spite of such diligent efforts, you may not be able to achieve long-term results by yourself.  In cases of severe obesity, bariatric or weight loss surgery is a proven method of achieving long-term weight control.  Our Services Our bariatric surgery programs offer our patients new hope and long-term weight-loss solution.  Since introducing our services in 2003, we have conducted more than 2,400 successful procedures.  Our program is designated as a Programmer, multimedia by the Metabolic and Bariatric Surgery Accreditation and Quality Improvement Program (MBSAQIP), a IT trainer that sets rigorous patient safety and outcome standards.  Our program is also designated as a Ecologist by [employer].   Our exceptional weight-loss surgery team specializes in diagnosis, treatment, follow-up care, and ongoing support for our patients with severe weight loss challenges.  We currently offer laparoscopic sleeve gastrectomy, gastric bypass, and  adjustable gastric band (LAP-BAND).    Attend our [place] Choosing to undergo a bariatric procedure is a big decision, and one that should not be taken lightly.  You now have two options in how you learn about weight-loss surgery - in person or online.  Our objective is to ensure you have all of the information that you need to evaluate the advantages and obligations of this life changing procedure.  Please note that you are not alone in this process, and our experienced team is ready to assist and answer all of your questions.  There are several ways to register for a seminar (either on-line or in person): 1)  Call [phone] 2) Go on-line to [facility] and register for either type of seminar.  [email or web address]

## 2019-10-03 ENCOUNTER — Other Ambulatory Visit: Payer: Self-pay

## 2019-10-03 ENCOUNTER — Encounter: Payer: Self-pay | Admitting: Dermatology

## 2019-10-03 ENCOUNTER — Ambulatory Visit: Payer: BC Managed Care – PPO | Admitting: Dermatology

## 2019-10-03 DIAGNOSIS — L245 Irritant contact dermatitis due to other chemical products: Secondary | ICD-10-CM | POA: Diagnosis not present

## 2019-10-03 MED ORDER — SILVER SULFADIAZINE 1 % EX CREA: 1.0000 "application " | TOPICAL_CREAM | Freq: Every evening | CUTANEOUS | 1 refills | Status: DC

## 2019-10-03 MED ORDER — HYDROCORTISONE 2.5 % EX CREA: TOPICAL_CREAM | Freq: Every evening | CUTANEOUS | 1 refills | Status: AC

## 2019-10-03 MED ORDER — HYDROCORTISONE 2.5 % EX OINT: TOPICAL_OINTMENT | Freq: Every morning | CUTANEOUS | 1 refills | Status: DC

## 2019-10-03 NOTE — Progress Notes (Signed)
Benadryl pills and cream

## 2019-10-07 ENCOUNTER — Encounter: Payer: Self-pay | Admitting: Dermatology

## 2019-10-07 NOTE — Progress Notes (Signed)
   New Patient   Subjective  Renee Lam is a 59 y.o. female who presents for the following: Skin Problem (wax on tuesday last week bumps and itch).  rash Location: pubic area Duration: 1 week Quality: Onset to first day after a [place] wax Associated Signs/Symptoms: Itching, burning Modifying Factors:  Severity:  Timing: Context: First visit to the spa   The following portions of the chart were reviewed this encounter and updated as appropriate:     Objective  Well appearing patient in no apparent distress; mood and affect are within normal limits.  A focused examination was performed including upper legs, perineum/vulva, lower abdomen. Relevant physical exam findings are noted in the Assessment and Plan.   Assessment & Plan  Irritant contact dermatitis due to other chemical products Left Thigh - Anterior  Silvadene in AM, 2.5% HC in PM. Call in five days with status report.

## 2019-10-10 ENCOUNTER — Other Ambulatory Visit: Payer: Self-pay | Admitting: Endocrinology

## 2019-10-10 DIAGNOSIS — E119 Type 2 diabetes mellitus without complications: Secondary | ICD-10-CM

## 2019-10-11 ENCOUNTER — Telehealth: Payer: Self-pay

## 2019-10-11 ENCOUNTER — Telehealth: Payer: Self-pay | Admitting: Dermatology

## 2019-10-11 NOTE — Telephone Encounter (Signed)
Patient is calling with a progress after last week's visit.  The Hydrocortisone ointment is working well.  Renee Lam does want to ask a question.  Dr. Denna Haggard said he knows someone who does electrolysis and Shalana wants the name of that person.  (Epic)

## 2019-10-11 NOTE — Telephone Encounter (Signed)
Received request from [facility] to complete yet again another PA for Dexcom. This has already been completed and has been approved 07/11/19 through 06/13/38, [id number] and PA# [id number]. Called pt to advise that we will not complete another PA as this has already been approved. Further advised that it is likely [facility] is NOT covered as DME supplier therefore resulting in request for another PA. Advised she call her insurance company to request name of covered DME supplier so Rx can be processed approprietly. Verbalized acceptance and understanding. Will await pt call with name of DME supplier.

## 2019-10-12 NOTE — Telephone Encounter (Signed)
Patient aware of previous message

## 2019-10-12 NOTE — Telephone Encounter (Signed)
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

## 2019-10-23 ENCOUNTER — Telehealth: Payer: Self-pay | Admitting: Cardiology

## 2019-10-23 NOTE — Telephone Encounter (Signed)
Reached out to patient and per Dr Radford Pax cancelled her upcoming appointment until she can get new supplies and get compliant. At that time we will check her compliance and make another appointment. Pt is agreeable to treatment.

## 2019-10-23 NOTE — Telephone Encounter (Signed)
Patient calling stating she has not been wearing her cpap for a while, and would like to know if she needs another sleep study before her f/u appointment 11/01/2019.

## 2019-10-27 ENCOUNTER — Other Ambulatory Visit: Payer: Self-pay | Admitting: Endocrinology

## 2019-10-27 DIAGNOSIS — E119 Type 2 diabetes mellitus without complications: Secondary | ICD-10-CM

## 2019-11-01 ENCOUNTER — Ambulatory Visit: Payer: BC Managed Care – PPO | Admitting: Cardiology

## 2019-11-05 ENCOUNTER — Other Ambulatory Visit: Payer: Self-pay

## 2019-11-05 ENCOUNTER — Encounter: Payer: Self-pay | Admitting: Dermatology

## 2019-11-05 ENCOUNTER — Ambulatory Visit: Payer: BC Managed Care – PPO | Admitting: Dermatology

## 2019-11-05 DIAGNOSIS — L820 Inflamed seborrheic keratosis: Secondary | ICD-10-CM | POA: Diagnosis not present

## 2019-11-05 DIAGNOSIS — D225 Melanocytic nevi of trunk: Secondary | ICD-10-CM

## 2019-11-05 DIAGNOSIS — D485 Neoplasm of uncertain behavior of skin: Secondary | ICD-10-CM

## 2019-11-05 DIAGNOSIS — L738 Other specified follicular disorders: Secondary | ICD-10-CM

## 2019-11-05 DIAGNOSIS — L259 Unspecified contact dermatitis, unspecified cause: Secondary | ICD-10-CM

## 2019-11-05 DIAGNOSIS — D229 Melanocytic nevi, unspecified: Secondary | ICD-10-CM

## 2019-11-05 NOTE — Patient Instructions (Signed)

## 2019-11-05 NOTE — Progress Notes (Signed)
   Follow-Up Visit   Subjective  Renee Lam is a 59 y.o. female who presents for the following: Annual Exam (FACE KERATOSES? SCALP BUMP ALSO X YEARS).  Growth Location: Scalp Duration: Months Quality: Larger Associated Signs/Symptoms: Modifying Factors:  Severity:  Timing: Context:   The following portions of the chart were reviewed this encounter and updated as appropriate: Tobacco  Allergies  Meds  Problems  Med Hx  Surg Hx  Fam Hx      Objective  Well appearing patient in no apparent distress; mood and affect are within normal limits.  Waist up plus legs examined.   Assessment & Plan  Contact dermatitis, unspecified contact dermatitis type, unspecified trigger Right Inguinal Area  Literature search to see if there are alternative adhesives.  Neoplasm of uncertain behavior of skin Mid Frontal Scalp  Skin / nail biopsy Type of biopsy: tangential   Informed consent: discussed and consent obtained   Timeout: patient name, date of birth, surgical site, and procedure verified   Procedure prep:  Patient was prepped and draped in usual sterile fashion Prep type:  Chlorhexidine Anesthesia: the lesion was anesthetized in a standard fashion   Anesthetic:  1% lidocaine w/ epinephrine 1-100,000 local infiltration Instrument used: flexible razor blade   Hemostasis achieved with: ferric subsulfate   Outcome: patient tolerated procedure well   Post-procedure details: wound care instructions given    Specimen 1 - Surgical pathology Differential Diagnosis: R/O AK vs SK Check Margins: No Cautery after biopsy  Nevus Mid Back  Annual skin examination  Sebaceous hyperplasia Right Malar Cheek  Leave if stable Follow-up visit for [facility].  A growth on the center front scalp has gotten larger and causes local discomfort; shave biopsy obtained and Lexiss can check there is results on MyChart in 2 days or contact my office.  The remainder of her general skin  examination showed no atypical moles or skin cancer.  On the left more than right outer cheek and on the upper back are a dozen subtly textured tan benign keratoses.  On the inner cheeks and upper nose are flesh-colored to cream-colored 3 mm umbilicated papules that are sebaceous hyperplasia.  These do not require any intervention.  We also discussed the tendency for the Dexcom adhesive to produce hyperpigmentation.  I will look the literature as to see if there is anything that can be done to prevent this.

## 2019-11-06 ENCOUNTER — Encounter: Payer: Self-pay | Admitting: Dermatology

## 2019-11-08 ENCOUNTER — Telehealth: Payer: Self-pay | Admitting: *Deleted

## 2019-11-08 ENCOUNTER — Telehealth: Payer: Self-pay | Admitting: Dermatology

## 2019-11-08 NOTE — Telephone Encounter (Signed)
Patient left a message on voicemail saying mychart alerted her there was a biopsy result ready for her to view but she can't see it. Patient request a call back with results please [phone]

## 2019-11-08 NOTE — Telephone Encounter (Signed)
Informed patient that her spot was benign per Dr. Denna Haggard. Unable to see report because Epic was down for about 1 hour.

## 2019-11-12 ENCOUNTER — Other Ambulatory Visit: Payer: Self-pay | Admitting: Internal Medicine

## 2019-11-12 DIAGNOSIS — J9801 Acute bronchospasm: Secondary | ICD-10-CM

## 2019-11-12 DIAGNOSIS — J4540 Moderate persistent asthma, uncomplicated: Secondary | ICD-10-CM

## 2019-11-17 DIAGNOSIS — T148XXA Other injury of unspecified body region, initial encounter: Secondary | ICD-10-CM | POA: Diagnosis not present

## 2019-11-17 DIAGNOSIS — S90852A Superficial foreign body, left foot, initial encounter: Secondary | ICD-10-CM | POA: Diagnosis not present

## 2019-12-19 DIAGNOSIS — G4733 Obstructive sleep apnea (adult) (pediatric): Secondary | ICD-10-CM | POA: Diagnosis not present

## 2019-12-27 ENCOUNTER — Ambulatory Visit: Payer: BC Managed Care – PPO | Admitting: Endocrinology

## 2019-12-27 ENCOUNTER — Other Ambulatory Visit: Payer: Self-pay

## 2019-12-27 ENCOUNTER — Encounter: Payer: Self-pay | Admitting: Endocrinology

## 2019-12-27 VITALS — BP 115/80 | HR 85 | Ht <= 58 in | Wt 182.0 lb

## 2019-12-27 DIAGNOSIS — E119 Type 2 diabetes mellitus without complications: Secondary | ICD-10-CM

## 2019-12-27 LAB — POCT GLYCOSYLATED HEMOGLOBIN (HGB A1C): Hemoglobin A1C: 6.9 % — AB (ref 4.0–5.6)

## 2019-12-27 NOTE — Progress Notes (Signed)
Subjective:    Patient ID: Renee Lam, female    DOB: July 10, 1960, 59 y.o.   MRN: [id number]  HPI Pt returns for f/u of diabetes mellitus: DM type: Insulin-requiring type 2.  Dx'ed: 5597 Complications: DR.  Therapy: insulin since 2010, Farxiga, and Victoza.  GDM: 1996 and 1999.  DKA: never.  Severe hypoglycemia: never.     Pancreatitis: never.   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  Interval history:  She says it it is still higher on work days than on days off.  She takes meds as rx'ed.  I reviewed continuous glucose monitor data. Glucose varies from 75-210.  It is in general higher PC than AC.  She seldom has hypoglycemia, and these episodes are mild.  She wants to continue to pursue pump.   Past Medical History:  Diagnosis Date  . Allergy   . Arthritis   . Asthma   . Chicken pox   . Diabetes mellitus   . Finger infection 2008   isolation  . GERD (gastroesophageal reflux disease)   . Heart murmur    benign per patient  . Hyperlipidemia   . Hypertension    past hx   . Ischemic optic neuropathy    right eye  . Migraine   . OSA (obstructive sleep apnea) 10/25/2016  . Osteoporosis   . Pericarditis   . Right sided sciatica 06/13/2017  . Sleep apnea    Patient is scheduled for sleep study tonight- wears cpap  . Viral meningitis     Past Surgical History:  Procedure Laterality Date  . CARDIAC CATHETERIZATION  2012  . CESAREAN SECTION     1997, 2000  . COLONOSCOPY    . KNEE ARTHROSCOPY Right 2010  . MENISCUS REPAIR Left 10/2017  . ORIF HUMERUS FRACTURE Right 03/25/2015   Procedure: OPEN REDUCTION INTERNAL FIXATION (ORIF) DISTAL HUMERUS FRACTURE;  Surgeon: Renette Butters, MD;  Location: [facility];  Service: Orthopedics;  Laterality: Right;  . POLYPECTOMY      Social History    Socioeconomic History  . Marital status: Divorced    Spouse name: Not on file  . Number of children: Not on file  . Years of education: Not on file  . Highest education level: Not on file  Occupational History  . Not on file  Tobacco Use  . Smoking status: Never Smoker  . Smokeless tobacco: Never Used  Vaping Use  . Vaping Use: Never used  Substance and Sexual Activity  . Alcohol use: Yes    Comment: once a year  . Drug use: No  . Sexual activity: Yes    Birth control/protection: Post-menopausal  Other Topics Concern  . Not on file  Social History Narrative  . Not on file   Social Determinants of Health   Financial Resource Strain:   . Difficulty of Paying Living Expenses:   Food Insecurity:   . Worried About Charity fundraiser in the Last Year:   . Arboriculturist in the Last Year:   Transportation Needs:   . Film/video editor (Medical):   Marland Kitchen Lack of Transportation (Non-Medical):   Physical Activity:   . Days of Exercise per Week:   . Minutes of Exercise per Session:   Stress:   . Feeling of Stress :   Social  Connections:   . Frequency of Communication with Friends and Family:   . Frequency of Social Gatherings with Friends and Family:   . Attends Religious Services:   . Active Member of Clubs or Organizations:   . Attends Archivist Meetings:   Marland Kitchen Marital Status:   Intimate Partner Violence:   . Fear of Current or Ex-Partner:   . Emotionally Abused:   Marland Kitchen Physically Abused:   . Sexually Abused:     Current Outpatient Medications on File Prior to Visit  Medication Sig Dispense Refill  . albuterol (PROAIR HFA) 108 (90 Base) MCG/ACT inhaler Inhale 2 puffs into the lungs as needed. 18 g 3  . aspirin EC 81 MG tablet Take 81 mg by mouth daily.    . Aspirin-Acetaminophen-Caffeine (EXCEDRIN EXTRA STRENGTH PO) Take by mouth as needed.    Marland Kitchen atorvastatin (LIPITOR) 80 MG tablet TAKE 1 TABLET BY MOUTH EVERYDAY AT BEDTIME 90 tablet 4  . BAYER CONTOUR  NEXT TEST test strip USE TO TEST FOUR TIMES A DAY AS INSTRUCTED.  12  . Continuous Blood Gluc Receiver (DEXCOM G6 RECEIVER) DEVI 1 EACH BY DOES NOT APPLY ROUTE SEE ADMIN INSTRUCTIONS. FOR CONTINUOUS GLUCOSE MONITORING E11.9 1 each 0  . Continuous Blood Gluc Sensor (DEXCOM G6 SENSOR) MISC 1 EACH BY DOES NOT APPLY ROUTE SEE ADMIN INSTRUCTIONS CHANGE SENSOR EVERY 10 DAYS E11.9 3 each 2  . Continuous Blood Gluc Transmit (DEXCOM G6 TRANSMITTER) MISC 1 each by Does not apply route See admin instructions. For continuous glucose monitoring; E11.9 1 each 2  . CONTOUR NEXT TEST test strip USE TO TEST 4 TIMES A DAY AS INSTRUCTED 300 each 1  . dapagliflozin propanediol (FARXIGA) 10 MG TABS tablet Take 10 mg by mouth daily. 30 tablet 11  . diphenhydrAMINE (BENADRYL) 25 MG tablet Take 25 mg by mouth every 6 (six) hours as needed for allergies.     Marland Kitchen EPINEPHrine 0.3 mg/0.3 mL IJ SOAJ injection INJECT 0.3 ML INTO THE MUSCLE ONCE 2 mL 0  . Fluticasone-Salmeterol (ADVAIR DISKUS) 250-50 MCG/DOSE AEPB Inhale 1 puff into the lungs 2 (two) times daily. 60 each 5  . hydrocortisone 2.5 % cream Apply topically every evening. 30 g 1  . Insulin Pen Needle (BD PEN NEEDLE NANO U/F) 32G X 4 MM MISC USE AS DIRECTED 4 TIMES A DAY 390 each 3  . liraglutide (VICTOZA) 18 MG/3ML SOPN INJECT 0.3 MLS (1.8 MG TOTAL) INTO THE SKIN DAILY. 27 pen 0  . montelukast (SINGULAIR) 10 MG tablet TAKE 1 TABLET BY MOUTH EVERYDAY AT BEDTIME 90 tablet 0  . naproxen (NAPROSYN) 500 MG tablet Take 1 tablet (500 mg total) by mouth 2 (two) times daily. 60 tablet 1  . silver sulfADIAZINE (SILVADENE) 1 % cream Apply 1 application topically every evening. 50 g 1  . traMADol (ULTRAM) 50 MG tablet Take 2 tablets (100 mg total) by mouth every 6 (six) hours as needed for moderate pain. 60 tablet 0  . TRESIBA FLEXTOUCH 200 UNIT/ML FlexTouch Pen INJECT 230 UNITS INTO THE SKIN DAILY. 39 pen 3  . Vitamin D, Ergocalciferol, (DRISDOL) 1.25 MG (50000 UT) CAPS capsule  Take 1 capsule (50,000 Units total) by mouth every 7 (seven) days. 12 capsule 0   No current facility-administered medications on file prior to visit.      Family History  Problem Relation Age of Onset  . Colon polyps Mother   . Diabetes Mother   . Heart failure Mother   . Peripheral  vascular disease Mother   . Diabetes Father   . Heart disease Father   . Arthritis Father   . Hypertension Father   . Heart failure Father   . Prostate cancer Father   . Arthritis Other   . Hyperlipidemia Other   . Heart disease Other   . Hypertension Other   . Depression Other   . Ovarian cancer Other   . Uterine cancer Other   . Breast cancer Other   . Prostate cancer Other   . Breast cancer Cousin   . Colon cancer Neg Hx   . Stomach cancer Neg Hx   . Rectal cancer Neg Hx   . Pancreatic cancer Neg Hx   . Esophageal cancer Neg Hx     BP 115/80   Pulse 85   Ht 4\' 10"  (1.473 m)   Wt 182 lb (82.6 kg)   SpO2 100%   BMI 38.04 kg/m    Review of Systems Denies LOC    Objective:   Physical Exam VITAL SIGNS:  See vs page GENERAL: no distress Pulses: dorsalis pedis intact bilat.   MSK: no deformity of the feet CV: no leg edema Skin:  no ulcer on the feet.  normal color and temp on the feet. Neuro: sensation is intact to touch on the feet.    Lab Results  Component Value Date   TSH 0.41 05/23/2018       Assessment & Plan:  Type 2 DM, with DR. Hypoglycemia, due to insulin: this limits aggressiveness of glycemic control   Patient Instructions  Please continue the same [place].  However, on days off, take just 190 units.  This is a very slow time-release insulin, so you can take it at any time of day.   Please continue the same other diabetes medications.   I have sent Vaughan Basta a message, to ask about the progress towards a pump.   check your blood sugar once a day.  vary the time of day when you check, between before the 3 meals, and at bedtime.  also check if you have symptoms  of your blood sugar being too high or too low.  please keep a record of the readings and bring it to your next appointment here (or you can bring the meter itself).  You can write it on any piece of paper.  please call us sooner if your blood sugar goes below 70, or if you have a lot of readings over 200. Please come back for a follow-up appointment in 3 months.

## 2019-12-27 NOTE — Patient Instructions (Addendum)
Please continue the same [place].  However, on days off, take just 190 units.  This is a very slow time-release insulin, so you can take it at any time of day.   Please continue the same other diabetes medications.   I have sent Renee Lam a message, to ask about the progress towards a pump.   check your blood sugar once a day.  vary the time of day when you check, between before the 3 meals, and at bedtime.  also check if you have symptoms of your blood sugar being too high or too low.  please keep a record of the readings and bring it to your next appointment here (or you can bring the meter itself).  You can write it on any piece of paper.  please call us sooner if your blood sugar goes below 70, or if you have a lot of readings over 200. Please come back for a follow-up appointment in 3 months.

## 2020-01-07 ENCOUNTER — Telehealth: Payer: Self-pay | Admitting: Endocrinology

## 2020-01-07 DIAGNOSIS — M79672 Pain in left foot: Secondary | ICD-10-CM | POA: Diagnosis not present

## 2020-01-07 DIAGNOSIS — M7751 Other enthesopathy of right foot: Secondary | ICD-10-CM | POA: Diagnosis not present

## 2020-01-07 DIAGNOSIS — M79671 Pain in right foot: Secondary | ICD-10-CM | POA: Diagnosis not present

## 2020-01-07 NOTE — Telephone Encounter (Signed)
Called pt and informed of temporary ONE TIME increased dosage as indicated below. Verbalized acceptance and understanding.

## 2020-01-07 NOTE — Telephone Encounter (Signed)
Please provide temporary orders s/p steroid injection

## 2020-01-07 NOTE — Telephone Encounter (Signed)
Please take 100 extra units of Tresiba, once.

## 2020-01-07 NOTE — Telephone Encounter (Signed)
Patient called stating she just saw her Pediatrist and they gave her a steroid injection in her heel and she's concerned about her sugar level rising - patient wants to know if this happens, should she take a short acting insulin or change her long acting? Ph# [phone]

## 2020-01-11 ENCOUNTER — Other Ambulatory Visit: Payer: Self-pay | Admitting: Endocrinology

## 2020-01-11 DIAGNOSIS — E119 Type 2 diabetes mellitus without complications: Secondary | ICD-10-CM

## 2020-01-21 ENCOUNTER — Other Ambulatory Visit: Payer: Self-pay

## 2020-01-21 ENCOUNTER — Telehealth: Payer: Self-pay

## 2020-01-21 DIAGNOSIS — E119 Type 2 diabetes mellitus without complications: Secondary | ICD-10-CM

## 2020-01-21 NOTE — Progress Notes (Signed)
error 

## 2020-01-21 NOTE — Telephone Encounter (Signed)
PRIOR AUTHORIZATION  PA initiation date: 01/21/20  Medication: [place]: [employer] Submission completed electronically through [employer] My Meds: Yes  Will await insurance response re: approval/denial.  Bobbye Morton (Key: [id number])  [facility] is reviewing your PA request. Typically an electronic response will be received within 72 hours. To check for an update later, open this request from your dashboard.  You may close this dialog and return to your dashboard to perform other tasks. Bobbye Morton (Key: [id number]) Tyler Aas FlexTouch (insulin degludec injection) 200 Units/mL solution   Form [facility] Electronic Prior Authorization Form (2017 NCPDP) Created 29 minutes ago Sent to Plan 20 minutes ago Plan Response 20 minutes ago Submit Clinical Questions 6 minutes ago Determination Wait for Determination Please wait for [facility] 2017 NCPDP to return a determination.

## 2020-01-22 NOTE — Telephone Encounter (Signed)
APPROVAL  Medication: [place]: [employer] [place] PA response: Approved Approval dates: 01/21/20 through 01/21/21  Documents have been labeled and placed in scan file for HIM and for our future reference.  Bobbye Morton (Key: [id number])  This request has received a Favorable outcome.  Please note any additional information provided by [facility] at the bottom of your screen.  You will also receive a faxed copy of the determination.  Bobbye Morton (Key: [id number]) Tyler Aas FlexTouch (insulin degludec injection) 200 Units/mL solution   Form [facility] Electronic Prior Authorization Form (2017 NCPDP) Created 1 day ago Sent to Plan 1 day ago Plan Response 1 day ago Submit Clinical Questions 24 hours ago Determination Favorable 22 hours ago Message from Plan Request Reference Number: [id number]. TRESIBA FLEX INJ 200UNIT is approved through 01/20/2021. Your patient may now fill this prescription and it will be covered.

## 2020-01-30 ENCOUNTER — Encounter: Payer: Self-pay | Admitting: Internal Medicine

## 2020-01-30 ENCOUNTER — Other Ambulatory Visit: Payer: Self-pay

## 2020-01-30 ENCOUNTER — Ambulatory Visit: Payer: BC Managed Care – PPO | Admitting: Internal Medicine

## 2020-01-30 VITALS — BP 120/78 | HR 80 | Temp 98.4°F | Wt 180.7 lb

## 2020-01-30 DIAGNOSIS — D510 Vitamin B12 deficiency anemia due to intrinsic factor deficiency: Secondary | ICD-10-CM | POA: Diagnosis not present

## 2020-01-30 DIAGNOSIS — E7800 Pure hypercholesterolemia, unspecified: Secondary | ICD-10-CM | POA: Diagnosis not present

## 2020-01-30 DIAGNOSIS — M5431 Sciatica, right side: Secondary | ICD-10-CM

## 2020-01-30 DIAGNOSIS — E559 Vitamin D deficiency, unspecified: Secondary | ICD-10-CM | POA: Diagnosis not present

## 2020-01-30 DIAGNOSIS — M2550 Pain in unspecified joint: Secondary | ICD-10-CM | POA: Diagnosis not present

## 2020-01-30 DIAGNOSIS — E1121 Type 2 diabetes mellitus with diabetic nephropathy: Secondary | ICD-10-CM | POA: Diagnosis not present

## 2020-01-30 LAB — COMPREHENSIVE METABOLIC PANEL
AG Ratio: 1.2 (calc) (ref 1.0–2.5)
ALT: 32 U/L — ABNORMAL HIGH (ref 6–29)
AST: 17 U/L (ref 10–35)
Albumin: 4.1 g/dL (ref 3.6–5.1)
Alkaline phosphatase (APISO): 73 U/L (ref 37–153)
BUN: 22 mg/dL (ref 7–25)
CO2: 24 mmol/L (ref 20–32)
Calcium: 9.4 mg/dL (ref 8.6–10.4)
Chloride: 105 mmol/L (ref 98–110)
Creat: 0.64 mg/dL (ref 0.50–0.99)
Globulin: 3.4 g/dL (calc) (ref 1.9–3.7)
Glucose, Bld: 124 mg/dL — ABNORMAL HIGH (ref 65–99)
Potassium: 4.2 mmol/L (ref 3.5–5.3)
Sodium: 140 mmol/L (ref 135–146)
Total Bilirubin: 0.4 mg/dL (ref 0.2–1.2)
Total Protein: 7.5 g/dL (ref 6.1–8.1)

## 2020-01-30 LAB — CBC WITH DIFFERENTIAL/PLATELET
Absolute Monocytes: 576 cells/uL (ref 200–950)
Basophils Absolute: 91 cells/uL (ref 0–200)
Basophils Relative: 0.9 %
Eosinophils Absolute: 253 cells/uL (ref 15–500)
Eosinophils Relative: 2.5 %
HCT: 43.5 % (ref 35.0–45.0)
Hemoglobin: 13.8 g/dL (ref 11.7–15.5)
Lymphs Abs: 3788 cells/uL (ref 850–3900)
MCH: 27.4 pg (ref 27.0–33.0)
MCHC: 31.7 g/dL — ABNORMAL LOW (ref 32.0–36.0)
MCV: 86.5 fL (ref 80.0–100.0)
MPV: 9.2 fL (ref 7.5–12.5)
Monocytes Relative: 5.7 %
Neutro Abs: 5393 cells/uL (ref 1500–7800)
Neutrophils Relative %: 53.4 %
Platelets: 435 10*3/uL — ABNORMAL HIGH (ref 140–400)
RBC: 5.03 10*6/uL (ref 3.80–5.10)
RDW: 13.3 % (ref 11.0–15.0)
Total Lymphocyte: 37.5 %
WBC: 10.1 10*3/uL (ref 3.8–10.8)

## 2020-01-30 LAB — VITAMIN B12: Vitamin B-12: 590 pg/mL (ref 200–1100)

## 2020-01-30 LAB — TSH: TSH: 2.08 mIU/L (ref 0.40–4.50)

## 2020-01-30 LAB — VITAMIN D 25 HYDROXY (VIT D DEFICIENCY, FRACTURES): Vit D, 25-Hydroxy: 18 ng/mL — ABNORMAL LOW (ref 30–100)

## 2020-01-30 NOTE — Patient Instructions (Signed)
-  Nice seeing you today!!  -Lab work today; will notify you once results are available.  -Back stretches every day, referral for PT has been placed.  -Over the counter ibuprofen is ok to use sporadically, can also try icing your local back, a massage might also prove useful.  -Schedule follow up in 3 months for your physical. Come in fasting.

## 2020-01-30 NOTE — Addendum Note (Signed)
Addended by: Westley Hummer B on: 01/30/2020 08:48 AM   Modules accepted: Orders

## 2020-01-30 NOTE — Progress Notes (Signed)
Established Patient Office Visit     This visit occurred during the SARS-CoV-2 public health emergency.  Safety protocols were in place, including screening questions prior to the visit, additional usage of staff PPE, and extensive cleaning of exam room while observing appropriate contact time as indicated for disinfecting solutions.    CC/Reason for Visit: Discuss some acute concerns  HPI: Renee Lam is a 59 y.o. female who is coming in today for the above mentioned reasons. Past Medical History is significant for: Diabetes followed by endocrinology, hyperlipidemia, obesity and vitamin D deficiency.  She has been having multiple joint issues, particularly her first MCP joint, her back pain with her sciatica is flaring up again, at times her knees and her feet also bother her.  She has not had lab work since 2019.  She has been losing weight.   Past Medical/Surgical History: Past Medical History:  Diagnosis Date  . Allergy   . Arthritis   . Asthma   . Chicken pox   . Diabetes mellitus   . Finger infection 2008   isolation  . GERD (gastroesophageal reflux disease)   . Heart murmur    benign per patient  . Hyperlipidemia   . Hypertension    past hx   . Ischemic optic neuropathy    right eye  . Migraine   . OSA (obstructive sleep apnea) 10/25/2016  . Osteoporosis   . Pericarditis   . Right sided sciatica 06/13/2017  . Sleep apnea    Patient is scheduled for sleep study tonight- wears cpap  . Viral meningitis     Past Surgical History:  Procedure Laterality Date  . CARDIAC CATHETERIZATION  2012  . CESAREAN SECTION     1997, 2000  . COLONOSCOPY    . KNEE ARTHROSCOPY Right 2010  . MENISCUS REPAIR Left 10/2017  . ORIF HUMERUS FRACTURE Right 03/25/2015   Procedure: OPEN REDUCTION INTERNAL FIXATION (ORIF) DISTAL HUMERUS FRACTURE;  Surgeon: Renette Butters, MD;  Location: [facility];  Service: Orthopedics;  Laterality: Right;  . POLYPECTOMY       Social History:  reports that she has never smoked. She has never used smokeless tobacco. She reports current alcohol use. She reports that she does not use drugs.  Allergies: Allergies  Allergen Reactions  . Food     Strawberries, mangoes, pineapple, eggplant, zucchini, macadamia nuts   . Penicillins Hives    Has patient had a PCN reaction causing immediate rash, facial/tongue/throat swelling, SOB or lightheadedness with hypotension: NoNo Has patient had a PCN reaction causing severe rash involving mucus membranes or skin necrosis: NoNo Has patient had a PCN reaction that required hospitalization NoNo Has patient had a PCN reaction occurring within the last 10 years: NoNo If all of the above answers are "NO", then may proceed with Cephalosporin   . Zithromax [Azithromycin] Hives    z-max; states can take Z pack    Family History:  Family History  Problem Relation Age of Onset  . Colon polyps Mother   . Diabetes Mother   . Heart failure Mother   . Peripheral vascular disease Mother   . Diabetes Father   . Heart disease Father   . Arthritis Father   . Hypertension Father   . Heart failure Father   . Prostate cancer Father   . Arthritis Other   . Hyperlipidemia Other   . Heart disease Other   . Hypertension Other   . Depression Other   .  Ovarian cancer Other   . Uterine cancer Other   . Breast cancer Other   . Prostate cancer Other   . Breast cancer Cousin   . Colon cancer Neg Hx   . Stomach cancer Neg Hx   . Rectal cancer Neg Hx   . Pancreatic cancer Neg Hx   . Esophageal cancer Neg Hx      Current Outpatient Medications:  .  albuterol (PROAIR HFA) 108 (90 Base) MCG/ACT inhaler, Inhale 2 puffs into the lungs as needed., Disp: 18 g, Rfl: 3 .  aspirin EC 81 MG tablet, Take 81 mg by mouth daily., Disp: , Rfl:  .  Aspirin-Acetaminophen-Caffeine (EXCEDRIN EXTRA STRENGTH PO), Take by mouth as needed., Disp: , Rfl:  .  atorvastatin (LIPITOR) 80 MG tablet, TAKE 1  TABLET BY MOUTH EVERYDAY AT BEDTIME, Disp: 90 tablet, Rfl: 4 .  BAYER CONTOUR NEXT TEST test strip, USE TO TEST FOUR TIMES A DAY AS INSTRUCTED., Disp: , Rfl: 12 .  Continuous Blood Gluc Receiver (DEXCOM G6 RECEIVER) DEVI, 1 EACH BY DOES NOT APPLY ROUTE SEE ADMIN INSTRUCTIONS. FOR CONTINUOUS GLUCOSE MONITORING E11.9, Disp: 1 each, Rfl: 0 .  Continuous Blood Gluc Sensor (DEXCOM G6 SENSOR) MISC, 1 EACH BY DOES NOT APPLY ROUTE SEE ADMIN INSTRUCTIONS CHANGE SENSOR EVERY 10 DAYS E11.9, Disp: 3 each, Rfl: 2 .  Continuous Blood Gluc Transmit (DEXCOM G6 TRANSMITTER) MISC, 1 each by Does not apply route See admin instructions. For continuous glucose monitoring; E11.9, Disp: 1 each, Rfl: 2 .  CONTOUR NEXT TEST test strip, USE TO TEST 4 TIMES A DAY AS INSTRUCTED, Disp: 300 each, Rfl: 1 .  dapagliflozin propanediol (FARXIGA) 10 MG TABS tablet, Take 10 mg by mouth daily., Disp: 30 tablet, Rfl: 11 .  diphenhydrAMINE (BENADRYL) 25 MG tablet, Take 25 mg by mouth every 6 (six) hours as needed for allergies. , Disp: , Rfl:  .  EPINEPHrine 0.3 mg/0.3 mL IJ SOAJ injection, INJECT 0.3 ML INTO THE MUSCLE ONCE, Disp: 2 mL, Rfl: 0 .  Fluticasone-Salmeterol (ADVAIR DISKUS) 250-50 MCG/DOSE AEPB, Inhale 1 puff into the lungs 2 (two) times daily., Disp: 60 each, Rfl: 5 .  hydrocortisone 2.5 % cream, Apply topically every evening., Disp: 30 g, Rfl: 1 .  Insulin Pen Needle (BD PEN NEEDLE NANO U/F) 32G X 4 MM MISC, USE AS DIRECTED 4 TIMES A DAY, Disp: 390 each, Rfl: 3 .  liraglutide (VICTOZA) 18 MG/3ML SOPN, INJECT 0.3 MLS (1.8 MG TOTAL) INTO THE SKIN DAILY., Disp: 27 pen, Rfl: 0 .  montelukast (SINGULAIR) 10 MG tablet, TAKE 1 TABLET BY MOUTH EVERYDAY AT BEDTIME, Disp: 90 tablet, Rfl: 0 .  naproxen (NAPROSYN) 500 MG tablet, Take 1 tablet (500 mg total) by mouth 2 (two) times daily., Disp: 60 tablet, Rfl: 1 .  silver sulfADIAZINE (SILVADENE) 1 % cream, Apply 1 application topically every evening., Disp: 50 g, Rfl: 1 .  traMADol  (ULTRAM) 50 MG tablet, Take 2 tablets (100 mg total) by mouth every 6 (six) hours as needed for moderate pain., Disp: 60 tablet, Rfl: 0 .  TRESIBA FLEXTOUCH 200 UNIT/ML FlexTouch Pen, INJECT 230 UNITS INTO THE SKIN DAILY., Disp: 39 pen, Rfl: 3 .  Vitamin D, Ergocalciferol, (DRISDOL) 1.25 MG (50000 UT) CAPS capsule, Take 1 capsule (50,000 Units total) by mouth every 7 (seven) days., Disp: 12 capsule, Rfl: 0  Review of Systems:  Constitutional: Denies fever, chills, diaphoresis, appetite change and fatigue.  HEENT: Denies photophobia, eye pain, redness, hearing loss, ear  pain, congestion, sore throat, rhinorrhea, sneezing, mouth sores, trouble swallowing, neck pain, neck stiffness and tinnitus.   Respiratory: Denies SOB, DOE, cough, chest tightness,  and wheezing.   Cardiovascular: Denies chest pain, palpitations and leg swelling.  Gastrointestinal: Denies nausea, vomiting, abdominal pain, diarrhea, constipation, blood in stool and abdominal distention.  Genitourinary: Denies dysuria, urgency, frequency, hematuria, flank pain and difficulty urinating.  Endocrine: Denies: hot or cold intolerance, sweats, changes in hair or nails, polyuria, polydipsia. Musculoskeletal: Denies myalgias and gait problem.  Skin: Denies pallor, rash and wound.  Neurological: Denies dizziness, seizures, syncope, weakness, light-headedness, numbness and headaches.  Hematological: Denies adenopathy. Easy bruising, personal or family bleeding history  Psychiatric/Behavioral: Denies suicidal ideation, mood changes, confusion, nervousness, sleep disturbance and agitation    Physical Exam: Vitals:   01/30/20 0800  BP: 120/78  Pulse: 80  Temp: 98.4 F (36.9 C)  TempSrc: Oral  SpO2: 96%  Weight: 180 lb 11.2 oz (82 kg)    Body mass index is 37.77 kg/m.   Constitutional: NAD, calm, comfortable Eyes: PERRL, lids and conjunctivae normal ENMT: Mucous membranes are moist.  Respiratory: clear to auscultation  bilaterally, no wheezing, no crackles. Normal respiratory effort. No accessory muscle use.  Cardiovascular: Regular rate and rhythm, no murmurs / rubs / gallops. No extremity edema.  Neurologic: Grossly intact and nonfocal Psychiatric: Normal judgment and insight. Alert and oriented x 3. Normal mood.    Impression and Plan:  Vitamin D deficiency  - Plan: VITAMIN D 25 Hydroxy (Vit-D Deficiency, Fractures)  Morbid obesity ([place]) -Discussed healthy lifestyle, including increased physical activity and better food choices to promote weight loss. -She has been losing weight.  Hypercholesterolemia -Last LDL in 2019 was above goal at 81, she will return fasting for lipid panel.  Controlled type 2 diabetes mellitus with diabetic nephropathy, unspecified whether long term insulin use ([place]) -Followed by endocrinology, most recent TSH in July was well-controlled at 6.9.  Right sided sciatica Multiple joint pain  -Check CBC/CMP/TSH/vitamin B12/vitamin D. -Have advised back stretches, icing, as needed NSAIDs, local massage therapy and referral to PT. -Weight loss long-term will be helpful.  Discussed supportive footwear. -Consider referral to rheumatology if no improvement given her multiple joint issues.   Patient Instructions  -Nice seeing you today!!  -Lab work today; will notify you once results are available.  -Back stretches every day, referral for PT has been placed.  -Over the counter ibuprofen is ok to use sporadically, can also try icing your local back, a massage might also prove useful.  -Schedule follow up in 3 months for your physical. Come in fasting.     Lelon Frohlich, MD [facility]

## 2020-01-30 NOTE — Addendum Note (Signed)
Addended by: Marrion Coy on: 01/30/2020 09:09 AM   Modules accepted: Orders

## 2020-01-31 ENCOUNTER — Other Ambulatory Visit: Payer: Self-pay | Admitting: Internal Medicine

## 2020-01-31 DIAGNOSIS — E559 Vitamin D deficiency, unspecified: Secondary | ICD-10-CM

## 2020-01-31 MED ORDER — VITAMIN D (ERGOCALCIFEROL) 1.25 MG (50000 UNIT) PO CAPS: 50000.0000 [IU] | ORAL_CAPSULE | ORAL | 0 refills | Status: AC

## 2020-02-01 ENCOUNTER — Other Ambulatory Visit: Payer: Self-pay | Admitting: Internal Medicine

## 2020-02-01 DIAGNOSIS — E559 Vitamin D deficiency, unspecified: Secondary | ICD-10-CM

## 2020-02-19 ENCOUNTER — Other Ambulatory Visit: Payer: Self-pay | Admitting: Endocrinology

## 2020-02-19 ENCOUNTER — Telehealth: Payer: Self-pay | Admitting: Endocrinology

## 2020-02-19 DIAGNOSIS — E119 Type 2 diabetes mellitus without complications: Secondary | ICD-10-CM

## 2020-02-19 NOTE — Telephone Encounter (Signed)
Please reduce to 150/d

## 2020-02-19 NOTE — Telephone Encounter (Signed)
Called pt and left a voicemail requesting a call back to discuss her symptoms further.

## 2020-02-19 NOTE — Telephone Encounter (Signed)
Patient requests to be called at ph#[phone]  Re: patient states her blood sugars have been bottoming out-bs got so low that her Dexcom could not read her numbers (lowest she was able to get was 44)

## 2020-02-19 NOTE — Telephone Encounter (Signed)
Called pt and gave her MD message. Pt verbalized understanding. 

## 2020-02-19 NOTE — Telephone Encounter (Signed)
Pt returned phone call and stated that her blood sugars have been dropping since starting a new diet. Her blod sugars have been going to 40s and 50s in the daytime and nighttime. Once, it has gone so low the Dexcom would not provide a reading.

## 2020-02-25 ENCOUNTER — Telehealth: Payer: Self-pay

## 2020-02-25 ENCOUNTER — Telehealth: Payer: Self-pay | Admitting: Endocrinology

## 2020-02-25 NOTE — Telephone Encounter (Signed)
PRIOR AUTHORIZATION  PA initiation date: 02/25/20  Medication: Victoza 18mg /52mL Insurance Company: [employer] completed electronically through [employer] My Meds: Yes  Will await insurance response re: approval/denial.  Renee Lam (Key: [id number])  Your information has been sent to [employer]. Renee Lam (Key: [id number])  [employer] is reviewing your PA request. Typically an electronic response will be received within 72 hours. To check for an update later, open this request from your dashboard.  You may close this dialog and return to your dashboard to perform other tasks.

## 2020-02-25 NOTE — Progress Notes (Addendum)
Virtual Visit via Telephone Note   This visit type was conducted due to national recommendations for restrictions regarding the COVID-19 Pandemic (e.g. social distancing) in an effort to limit this patient's exposure and mitigate transmission in our community.  Due to her co-morbid illnesses, this patient is at least at moderate risk for complications without adequate follow up.  This format is felt to be most appropriate for this patient at this time.  All issues noted in this document were discussed and addressed.  A limited physical exam was performed with this format.  Please refer to the patient's chart for her consent to telehealth for [facility].  Evaluation Performed:  Follow-up visit  This visit type was conducted due to national recommendations for restrictions regarding the COVID-19 Pandemic (e.g. social distancing).  This format is felt to be most appropriate for this patient at this time.  All issues noted in this document were discussed and addressed.  No physical exam was performed (except for noted visual exam findings with Video Visits).  Please refer to the patient's chart (MyChart message for video visits and phone note for telephone visits) for the patient's consent to telehealth for [facility].  Date:  02/26/2020   ID:  Renee Lam, DOB 12-25-1960, MRN [id number]  Patient Location:  Home  Provider location:   [facility]  PCP:  Isaac Bliss, Rayford Halsted, MD  Cardiologist:  None Sleep Medicine:  Fransico Him, MD Electrophysiologist:  None   Chief Complaint:  OSA  History of Present Illness:    Renee Lam is a 59 y.o. female who presents via audio/video conferencing for a telehealth visit today.    Renee Lam is a 59 y.o. female with a hx of severe OSA with an AHI of 46.8/hr with oxygen desaturations as low as 72% with loud snoring.  She is on CPAP at 18cm H2O.  She is doing well with her CPAP device and thinks that she has gotten used  to it.  She tolerates the mask and feels the pressure is adequate.  Since going on CPAP she feels rested in the am and has no significant daytime sleepiness.  She denies any significant mouth or nasal dryness or nasal congestion.  She does not think that he snores.    The patient does not have symptoms concerning for COVID-19 infection (fever, chills, cough, or new shortness of breath).    Prior CV studies:   The following studies were reviewed today:  PAP compliance download  Past Medical History:  Diagnosis Date  . Allergy   . Arthritis   . Asthma   . Chicken pox   . Diabetes mellitus   . Finger infection 2008   isolation  . GERD (gastroesophageal reflux disease)   . Heart murmur    benign per patient  . Hyperlipidemia   . Hypertension    past hx   . Ischemic optic neuropathy    right eye  . Migraine   . OSA (obstructive sleep apnea) 10/25/2016  . Osteoporosis   . Pericarditis   . Right sided sciatica 06/13/2017  . Sleep apnea    Patient is scheduled for sleep study tonight- wears cpap  . Viral meningitis    Past Surgical History:  Procedure Laterality Date  . CARDIAC CATHETERIZATION  2012  . CESAREAN SECTION     1997, 2000  . COLONOSCOPY    . KNEE ARTHROSCOPY Right 2010  . MENISCUS REPAIR Left 10/2017  . ORIF HUMERUS FRACTURE  Right 03/25/2015   Procedure: OPEN REDUCTION INTERNAL FIXATION (ORIF) DISTAL HUMERUS FRACTURE;  Surgeon: Renette Butters, MD;  Location: [facility];  Service: Orthopedics;  Laterality: Right;  . POLYPECTOMY       Current Meds  Medication Sig  . albuterol (PROAIR HFA) 108 (90 Base) MCG/ACT inhaler Inhale 2 puffs into the lungs as needed.  Marland Kitchen aspirin EC 81 MG tablet Take 81 mg by mouth daily.  . Aspirin-Acetaminophen-Caffeine (EXCEDRIN EXTRA STRENGTH PO) Take by mouth as needed.  Marland Kitchen atorvastatin (LIPITOR) 80 MG tablet TAKE 1 TABLET BY MOUTH EVERYDAY AT BEDTIME  . BAYER CONTOUR NEXT TEST test strip USE TO TEST FOUR TIMES A DAY  AS INSTRUCTED.  Marland Kitchen Continuous Blood Gluc Receiver (DEXCOM G6 RECEIVER) DEVI 1 EACH BY DOES NOT APPLY ROUTE SEE ADMIN INSTRUCTIONS. FOR CONTINUOUS GLUCOSE MONITORING E11.9  . Continuous Blood Gluc Sensor (DEXCOM G6 SENSOR) MISC 1 EACH BY DOES NOT APPLY ROUTE SEE ADMIN INSTRUCTIONS CHANGE SENSOR EVERY 10 DAYS E11.9  . Continuous Blood Gluc Transmit (DEXCOM G6 TRANSMITTER) MISC 1 each by Does not apply route See admin instructions. For continuous glucose monitoring; E11.9  . CONTOUR NEXT TEST test strip USE TO TEST 4 TIMES A DAY AS INSTRUCTED  . dapagliflozin propanediol (FARXIGA) 10 MG TABS tablet Take 10 mg by mouth daily.  . diphenhydrAMINE (BENADRYL) 25 MG tablet Take 25 mg by mouth every 6 (six) hours as needed for allergies.   Marland Kitchen EPINEPHrine 0.3 mg/0.3 mL IJ SOAJ injection INJECT 0.3 ML INTO THE MUSCLE ONCE  . Fluticasone-Salmeterol (ADVAIR DISKUS) 250-50 MCG/DOSE AEPB Inhale 1 puff into the lungs 2 (two) times daily.  . hydrocortisone 2.5 % cream Apply topically every evening. (Patient taking differently: Apply topically as needed. )  . Insulin Pen Needle (BD PEN NEEDLE NANO U/F) 32G X 4 MM MISC USE AS DIRECTED 4 TIMES A DAY  . liraglutide (VICTOZA) 18 MG/3ML SOPN INJECT 1.8 MG UNDER THE SKIN ONCE DAILY  . montelukast (SINGULAIR) 10 MG tablet TAKE 1 TABLET BY MOUTH EVERYDAY AT BEDTIME  . traMADol (ULTRAM) 50 MG tablet Take 2 tablets (100 mg total) by mouth every 6 (six) hours as needed for moderate pain.  . TRESIBA FLEXTOUCH 200 UNIT/ML FlexTouch Pen INJECT 230 UNITS INTO THE SKIN DAILY.  Marland Kitchen Vitamin D, Ergocalciferol, (DRISDOL) 1.25 MG (50000 UNIT) CAPS capsule Take 1 capsule (50,000 Units total) by mouth every 7 (seven) days for 12 doses.     Allergies:   Metformin and related, Food, Penicillins, and Zithromax [azithromycin]   Social History   Tobacco Use  . Smoking status: Never Smoker  . Smokeless tobacco: Never Used  Vaping Use  . Vaping Use: Never used  Substance Use Topics  .  Alcohol use: Yes    Comment: once a year  . Drug use: No     Family Hx: The patient's family history includes Arthritis in her father and another family member; Breast cancer in her cousin and another family member; Colon polyps in her mother; Depression in an other family member; Diabetes in her father and mother; Heart disease in her father and another family member; Heart failure in her father and mother; Hyperlipidemia in an other family member; Hypertension in her father and another family member; Ovarian cancer in an other family member; Peripheral vascular disease in her mother; Prostate cancer in her father and another family member; Uterine cancer in an other family member. There is no history of Colon cancer, Stomach cancer, Rectal cancer,  Pancreatic cancer, or Esophageal cancer.  ROS:   Please see the history of present illness.     All other systems reviewed and are negative.   Labs/Other Tests and Data Reviewed:    Recent Labs: 01/30/2020: ALT 32; BUN 22; Creat 0.64; Hemoglobin 13.8; Platelets 435; Potassium 4.2; Sodium 140; TSH 2.08   Recent Lipid Panel Lab Results  Component Value Date/Time   CHOL 166 05/23/2018 09:21 AM   TRIG 134.0 05/23/2018 09:21 AM   HDL 58.20 05/23/2018 09:21 AM   CHOLHDL 3 05/23/2018 09:21 AM   LDLCALC 81 05/23/2018 09:21 AM    Wt Readings from Last 3 Encounters:  01/30/20 180 lb 11.2 oz (82 kg)  12/27/19 182 lb (82.6 kg)  09/20/19 179 lb (81.2 kg)     Objective:    Vital Signs:  BP 117/69   Pulse 92   Temp 97.7 F (36.5 C)   Ht 4\' 10"  (1.473 m)   SpO2 95%   BMI 37.77 kg/m     ASSESSMENT & PLAN:    1.  OSA - The patient is tolerating PAP therapy well without any problems. The PAP download was reviewed today and showed an AHI of 2/hr on auto PAP cm H2O with 27% compliance in using more than 4 hours nightly.  The patient has been using and benefiting from PAP use and will continue to benefit from therapy. -I have encouraged her to  be more compliant with her device - she is doing shift work and it is difficult for her to sleep enough to make the compliance  2.  Morbid Obesity -I have encouraged her to get into a routine exercise program and cut back on carbs and portions.  -she has started on an [place] to try to lose weight   COVID-19 Education: She has had both her COVID 19 vaccines  Patient Risk:   After full review of this patient's clinical status, I feel that they are at least moderate risk at this time.  Time:   Today, I have spent 20 minutes on telemedicine discussing medical problems including OSA and Obesity and reviewing patient's chart including PAP compliance downloacd.  Medication Adjustments/Labs and Tests Ordered: Current medicines are reviewed at length with the patient today.  Concerns regarding medicines are outlined above.  Tests Ordered: No orders of the defined types were placed in this encounter.  Medication Changes: No orders of the defined types were placed in this encounter.   Disposition:  Follow up in 1 year(s)  Signed, Fransico Him, MD  02/26/2020 8:29 AM    [facility]

## 2020-02-25 NOTE — Telephone Encounter (Signed)
My Chart message sent to pt requesting that she provide her CBG readings for the last 7 days. Cannot address without readings. Will await her response

## 2020-02-25 NOTE — Telephone Encounter (Signed)
DENIAL  Medication: [place]: [employer] [place] PA response: Denied Rationale: Must have tried and failed Metformin  NOTE - Appeal has been initiated  Letter has been faxed to [employer]. Confirmation received. Will await response.

## 2020-02-25 NOTE — Telephone Encounter (Signed)
Patient requests to be called at ph# [phone] re: patient would like to increase her insulin

## 2020-02-26 ENCOUNTER — Encounter: Payer: Self-pay | Admitting: Cardiology

## 2020-02-26 ENCOUNTER — Other Ambulatory Visit: Payer: Self-pay

## 2020-02-26 ENCOUNTER — Telehealth (INDEPENDENT_AMBULATORY_CARE_PROVIDER_SITE_OTHER): Payer: BC Managed Care – PPO | Admitting: Cardiology

## 2020-02-26 VITALS — BP 117/69 | HR 92 | Temp 97.7°F | Ht <= 58 in

## 2020-02-26 DIAGNOSIS — G4733 Obstructive sleep apnea (adult) (pediatric): Secondary | ICD-10-CM

## 2020-02-26 NOTE — Patient Instructions (Signed)

## 2020-02-29 DIAGNOSIS — M5431 Sciatica, right side: Secondary | ICD-10-CM | POA: Diagnosis not present

## 2020-02-29 DIAGNOSIS — Z7409 Other reduced mobility: Secondary | ICD-10-CM | POA: Diagnosis not present

## 2020-02-29 DIAGNOSIS — M2550 Pain in unspecified joint: Secondary | ICD-10-CM | POA: Diagnosis not present

## 2020-02-29 DIAGNOSIS — M5432 Sciatica, left side: Secondary | ICD-10-CM | POA: Diagnosis not present

## 2020-03-06 ENCOUNTER — Telehealth (INDEPENDENT_AMBULATORY_CARE_PROVIDER_SITE_OTHER): Payer: BC Managed Care – PPO | Admitting: Family Medicine

## 2020-03-06 DIAGNOSIS — R05 Cough: Secondary | ICD-10-CM | POA: Diagnosis not present

## 2020-03-06 DIAGNOSIS — R062 Wheezing: Secondary | ICD-10-CM | POA: Diagnosis not present

## 2020-03-06 DIAGNOSIS — R058 Other specified cough: Secondary | ICD-10-CM

## 2020-03-06 DIAGNOSIS — R059 Cough, unspecified: Secondary | ICD-10-CM

## 2020-03-06 DIAGNOSIS — J45901 Unspecified asthma with (acute) exacerbation: Secondary | ICD-10-CM | POA: Diagnosis not present

## 2020-03-06 MED ORDER — PREDNISONE 20 MG PO TABS: 40.0000 mg | ORAL_TABLET | Freq: Every day | ORAL | 0 refills | Status: DC

## 2020-03-06 MED ORDER — DOXYCYCLINE HYCLATE 100 MG PO TABS: 100.0000 mg | ORAL_TABLET | Freq: Two times a day (BID) | ORAL | 0 refills | Status: DC

## 2020-03-06 MED ORDER — BENZONATATE 100 MG PO CAPS: 100.0000 mg | ORAL_CAPSULE | Freq: Three times a day (TID) | ORAL | 0 refills | Status: DC | PRN

## 2020-03-06 NOTE — Progress Notes (Addendum)
Virtual Visit via Video Note  I connected with Renee Lam  on 03/06/20 at  5:20 PM EDT by a video enabled telemedicine application and verified that I am speaking with the correct person using two identifiers.  Location patient: home Location provider:work or home office Persons participating in the virtual visit: patient, provider  I discussed the limitations of evaluation and management by telemedicine and the availability of in person appointments. The patient expressed understanding and agreed to proceed.   HPI:  Acute telemedicine visit for Renee Lam: -Onset: yesterday -Symptoms include: headache, sore throat, nasal congestion, productive cough - lots of mucus, some wheezing, O2 around 92, using her albuterol frequently - this helps, mild SOB -she has had some asthma issues for the last month or so with work being done in her apartment -had negative covid test through work today -Denies:fevers, NVD, severe headache, malaise, inability to get our of bed/eat or drink -Pertinent past medical history: asthma - takes Singulair, has not been taking her advair - is supposed to take daily; reports has required prednisone in the past and works with her endocrinologist when takes -reports BSs have been good recently -no known sick contacts -Pertinent medication allergies: -COVID-19 vaccine status: fully vaccinated with Moderna  ROS: See pertinent positives and negatives per HPI.  Past Medical History:  Diagnosis Date  . Allergy   . Arthritis   . Asthma   . Chicken pox   . Diabetes mellitus   . Finger infection 2008   isolation  . GERD (gastroesophageal reflux disease)   . Heart murmur    benign per patient  . Hyperlipidemia   . Hypertension    past hx   . Ischemic optic neuropathy    right eye  . Migraine   . OSA (obstructive sleep apnea) 10/25/2016  . Osteoporosis   . Pericarditis   . Right sided sciatica 06/13/2017  . Sleep apnea    Patient is scheduled for sleep study  tonight- wears cpap  . Viral meningitis     Past Surgical History:  Procedure Laterality Date  . CARDIAC CATHETERIZATION  2012  . CESAREAN SECTION     1997, 2000  . COLONOSCOPY    . KNEE ARTHROSCOPY Right 2010  . MENISCUS REPAIR Left 10/2017  . ORIF HUMERUS FRACTURE Right 03/25/2015   Procedure: OPEN REDUCTION INTERNAL FIXATION (ORIF) DISTAL HUMERUS FRACTURE;  Surgeon: Renette Butters, MD;  Location: [facility];  Service: Orthopedics;  Laterality: Right;  . POLYPECTOMY       Current Outpatient Medications:  .  albuterol (PROAIR HFA) 108 (90 Base) MCG/ACT inhaler, Inhale 2 puffs into the lungs as needed., Disp: 18 g, Rfl: 3 .  aspirin EC 81 MG tablet, Take 81 mg by mouth daily., Disp: , Rfl:  .  Aspirin-Acetaminophen-Caffeine (EXCEDRIN EXTRA STRENGTH PO), Take by mouth as needed., Disp: , Rfl:  .  atorvastatin (LIPITOR) 80 MG tablet, TAKE 1 TABLET BY MOUTH EVERYDAY AT BEDTIME, Disp: 90 tablet, Rfl: 4 .  BAYER CONTOUR NEXT TEST test strip, USE TO TEST FOUR TIMES A DAY AS INSTRUCTED., Disp: , Rfl: 12 .  benzonatate (TESSALON PERLES) 100 MG capsule, Take 1 capsule (100 mg total) by mouth 3 (three) times daily as needed., Disp: 20 capsule, Rfl: 0 .  Continuous Blood Gluc Receiver (DEXCOM G6 RECEIVER) DEVI, 1 EACH BY DOES NOT APPLY ROUTE SEE ADMIN INSTRUCTIONS. FOR CONTINUOUS GLUCOSE MONITORING E11.9, Disp: 1 each, Rfl: 0 .  Continuous Blood Gluc Sensor (DEXCOM G6 SENSOR) MISC,  1 EACH BY DOES NOT APPLY ROUTE SEE ADMIN INSTRUCTIONS CHANGE SENSOR EVERY 10 DAYS E11.9, Disp: 3 each, Rfl: 2 .  Continuous Blood Gluc Transmit (DEXCOM G6 TRANSMITTER) MISC, 1 each by Does not apply route See admin instructions. For continuous glucose monitoring; E11.9, Disp: 1 each, Rfl: 2 .  CONTOUR NEXT TEST test strip, USE TO TEST 4 TIMES A DAY AS INSTRUCTED, Disp: 300 each, Rfl: 1 .  dapagliflozin propanediol (FARXIGA) 10 MG TABS tablet, Take 10 mg by mouth daily., Disp: 30 tablet, Rfl: 11 .   diphenhydrAMINE (BENADRYL) 25 MG tablet, Take 25 mg by mouth every 6 (six) hours as needed for allergies. , Disp: , Rfl:  .  doxycycline (VIBRA-TABS) 100 MG tablet, Take 1 tablet (100 mg total) by mouth 2 (two) times daily., Disp: 14 tablet, Rfl: 0 .  EPINEPHrine 0.3 mg/0.3 mL IJ SOAJ injection, INJECT 0.3 ML INTO THE MUSCLE ONCE, Disp: 2 mL, Rfl: 0 .  Fluticasone-Salmeterol (ADVAIR DISKUS) 250-50 MCG/DOSE AEPB, Inhale 1 puff into the lungs 2 (two) times daily., Disp: 60 each, Rfl: 5 .  hydrocortisone 2.5 % cream, Apply topically every evening. (Patient taking differently: Apply topically as needed. ), Disp: 30 g, Rfl: 1 .  Insulin Pen Needle (BD PEN NEEDLE NANO U/F) 32G X 4 MM MISC, USE AS DIRECTED 4 TIMES A DAY, Disp: 390 each, Rfl: 3 .  liraglutide (VICTOZA) 18 MG/3ML SOPN, INJECT 1.8 MG UNDER THE SKIN ONCE DAILY, Disp: 9 mL, Rfl: 8 .  montelukast (SINGULAIR) 10 MG tablet, TAKE 1 TABLET BY MOUTH EVERYDAY AT BEDTIME, Disp: 90 tablet, Rfl: 0 .  naproxen (NAPROSYN) 500 MG tablet, Take 1 tablet (500 mg total) by mouth 2 (two) times daily. (Patient not taking: Reported on 02/26/2020), Disp: 60 tablet, Rfl: 1 .  predniSONE (DELTASONE) 20 MG tablet, Take 2 tablets (40 mg total) by mouth daily with breakfast., Disp: 8 tablet, Rfl: 0 .  silver sulfADIAZINE (SILVADENE) 1 % cream, Apply 1 application topically every evening. (Patient not taking: Reported on 02/26/2020), Disp: 50 g, Rfl: 1 .  traMADol (ULTRAM) 50 MG tablet, Take 2 tablets (100 mg total) by mouth every 6 (six) hours as needed for moderate pain., Disp: 60 tablet, Rfl: 0 .  TRESIBA FLEXTOUCH 200 UNIT/ML FlexTouch Pen, INJECT 230 UNITS INTO THE SKIN DAILY., Disp: 45 mL, Rfl: 3 .  Vitamin D, Ergocalciferol, (DRISDOL) 1.25 MG (50000 UNIT) CAPS capsule, Take 1 capsule (50,000 Units total) by mouth every 7 (seven) days for 12 doses., Disp: 12 capsule, Rfl: 0  EXAM:  VITALS per patient if applicable:  GENERAL: alert, oriented, appears well and in no  acute distress  HEENT: atraumatic, conjunttiva clear, no obvious abnormalities on inspection of external nose and ears  NECK: normal movements of the head and neck  LUNGS: on inspection no signs of respiratory distress, breathing rate appears normal, no obvious gross SOB, gasping or wheezing  CV: no obvious cyanosis  MS: moves all visible extremities without noticeable abnormality  PSYCH/NEURO: pleasant and cooperative, no obvious depression or anxiety, speech and thought processing grossly intact  ASSESSMENT AND PLAN:  Discussed the following assessment and plan:  Cough  Asthma with acute exacerbation, unspecified asthma severity, unspecified whether persistent  Wheezing  Sputum production  -we discussed possible serious and likely etiologies, options for evaluation and workup, limitations of telemedicine visit vs in person visit, treatment, treatment risks and precautions.  Advised that given her O2 she may need in person evaluation.  However, she prefers  to treat via telemedicine empirically and she reports she has a nearby urgent care she can go to if she is worsening or not improving promptly.  It seem she may be having a flare in her asthma, possibly related to the recent exposures versus a viral infection versus other.  She opted for empiric treatment with doxycycline given the increased discolored sputum production to cover for bacterial respiratory organisms.  For the asthma exasperation opted to treat with a prednisone burst and as needed albuterol.  Advised getting back on her regular maintenance inhalers in the future to prevent further problems with her asthma.  She agrees to work with her endocrinologist and monitor her sugars while on the prednisone.  Discussed possibility of influenza, though seems less likely given the symptoms.  Discussed Tamiflu, but opted to hold off for now.  She reports there is an urgent care near her house that has availability, in case she has  worsening over the next 24 hours.  She is fully vaccinated for COVID-19 and had a negative Covid test.  Did discuss that sometimes testing can inaccurate when it is done too early.  She may consider another Covid test however I have not yet seen a breakthrough case in patients vaccinated with Moderna.  Advised staying home while sick and a work note was provided. Work/School slipped offered: provided in patient instructions   Scheduled follow up with PCP offered: She has decided to follow-up at a local urgent care if any worsening, new symptoms or is not improving promptly with treatment   Did let this patient know that I only do telemedicine on Tuesdays and Thursdays for [facility]. Advised to schedule follow up visit with PCP or [facility] if any further questions or concerns to avoid delays in care.   I discussed the assessment and treatment plan with the patient. The patient was provided an opportunity to ask questions and all were answered. The patient agreed with the plan and demonstrated an understanding of the instructions.     Lucretia Kern, DO

## 2020-03-06 NOTE — Patient Instructions (Addendum)
° ° ° °--------------------------------------------------------------------------------------------------------------------------- ° ° ° °  WORK SLIP:  Patient Renee Lam,  Mar 25, 1961, was seen for a medical visit today, 03/06/20 . Please excuse from work according to the [facility] guidelines for a COVID like illness. We advise 10 days minimum from the onset of symptoms (03/05/20) PLUS 1 day of no fever and improved symptoms. Will defer to employer for a sooner return to work if [place] testing is negative and the symptoms have resolved. Advise following CDC guidelines.   Sincerely: E-signature: Dr. Colin Benton, DO [place] Ph: [phone]   ------------------------------------------------------------------------------------------------------------------------------    -stay home while sick  -If you decide to retest for covid this is one option: [facility] COVID19 testing information: [email or web address] OR [id number]  -I sent the medication(s) we discussed to your pharmacy: Meds ordered this encounter  Medications   doxycycline (VIBRA-TABS) 100 MG tablet    Sig: Take 1 tablet (100 mg total) by mouth 2 (two) times daily.    Dispense:  14 tablet    Refill:  0   predniSONE (DELTASONE) 20 MG tablet    Sig: Take 2 tablets (40 mg total) by mouth daily with breakfast.    Dispense:  8 tablet    Refill:  0   benzonatate (TESSALON PERLES) 100 MG capsule    Sig: Take 1 capsule (100 mg total) by mouth 3 (three) times daily as needed.    Dispense:  20 capsule    Refill:  0    -can use tylenol or aleve if needed for fevers, aches and pains per instructions  -can use nasal saline a few times per day if nasal congestion  -stay hydrated, drink plenty of fluids and eat small healthy meals - avoid dairy  -can take Vit C [phone] IU daily  -follow up with your doctor or the urgent care in 1-2 days unless improving and feeling  better  I hope you are feeling better soon! Seek in-person care or a follow up telemedicine visit promptly if your symptoms worsen, new concerns arise or you are not improving as expected. Call 911 if severe symptoms.

## 2020-03-10 NOTE — Telephone Encounter (Signed)
Victoza Approved.  Josem Kaufmann #[id number] 04/2020-04/2021

## 2020-03-13 ENCOUNTER — Other Ambulatory Visit: Payer: Self-pay | Admitting: Endocrinology

## 2020-03-30 ENCOUNTER — Other Ambulatory Visit: Payer: Self-pay | Admitting: Endocrinology

## 2020-03-30 DIAGNOSIS — E119 Type 2 diabetes mellitus without complications: Secondary | ICD-10-CM

## 2020-04-01 DIAGNOSIS — G4733 Obstructive sleep apnea (adult) (pediatric): Secondary | ICD-10-CM | POA: Diagnosis not present

## 2020-04-02 ENCOUNTER — Telehealth: Payer: Self-pay | Admitting: Endocrinology

## 2020-04-02 ENCOUNTER — Ambulatory Visit: Payer: BC Managed Care – PPO | Admitting: Endocrinology

## 2020-04-02 ENCOUNTER — Encounter: Payer: Self-pay | Admitting: Endocrinology

## 2020-04-02 ENCOUNTER — Other Ambulatory Visit: Payer: Self-pay

## 2020-04-02 ENCOUNTER — Telehealth: Payer: Self-pay | Admitting: Cardiology

## 2020-04-02 VITALS — BP 120/80 | HR 74 | Ht <= 58 in | Wt 182.0 lb

## 2020-04-02 DIAGNOSIS — E119 Type 2 diabetes mellitus without complications: Secondary | ICD-10-CM

## 2020-04-02 LAB — POCT GLYCOSYLATED HEMOGLOBIN (HGB A1C): Hemoglobin A1C: 6.7 % — AB (ref 4.0–5.6)

## 2020-04-02 MED ORDER — TRESIBA FLEXTOUCH 200 UNIT/ML ~~LOC~~ SOPN: 180.0000 [IU] | PEN_INJECTOR | Freq: Every day | SUBCUTANEOUS | 3 refills | Status: DC

## 2020-04-02 NOTE — Telephone Encounter (Signed)
Sorry to bother you again, but could you please remind me about the progress towards her pump?  Thank you.

## 2020-04-02 NOTE — Progress Notes (Signed)
Subjective:    Patient ID: Renee Lam, female    DOB: 1961/05/13, 59 y.o.   MRN: [id number]  HPI Pt returns for f/u of diabetes mellitus: DM type: Insulin-requiring type 2.  Dx'ed: 4580 Complications: DR.  Therapy: insulin since 2010, Farxiga, and Victoza.  GDM: 1996 and 1999.  DKA: never.  Severe hypoglycemia: never.     Pancreatitis: never.   SDOH: she works 3rd shift, as a Marine scientist; Other: she takes QD insulin, after poor results with multiple daily injections; she did not tolerate metformin (2013-2017--stopped due to GI upset).  Interval history:  She says cbg is still higher on work days than on days off.  She takes meds as rx'ed.  I reviewed continuous glucose monitor data. Glucose varies from 70-245.  It is in general higher as the day goes on, but not necessarily so. She has hypoglycemia approx twice per week, and these episodes are mild.  She wants to continue to pursue pump.  She took prednisone approx 2 weeks ago.   Past Medical History:  Diagnosis Date  . Allergy   . Arthritis   . Asthma   . Chicken pox   . Diabetes mellitus   . Finger infection 2008   isolation  . GERD (gastroesophageal reflux disease)   . Heart murmur    benign per patient  . Hyperlipidemia   . Hypertension    past hx   . Ischemic optic neuropathy    right eye  . Migraine   . OSA (obstructive sleep apnea) 10/25/2016  . Osteoporosis   . Pericarditis   . Right sided sciatica 06/13/2017  . Sleep apnea    Patient is scheduled for sleep study tonight- wears cpap  . Viral meningitis     Past Surgical History:  Procedure Laterality Date  . CARDIAC CATHETERIZATION  2012  . CESAREAN SECTION     1997, 2000  . COLONOSCOPY    . KNEE ARTHROSCOPY Right 2010  . MENISCUS REPAIR Left 10/2017  . ORIF HUMERUS FRACTURE Right 03/25/2015   Procedure: OPEN REDUCTION INTERNAL FIXATION (ORIF) DISTAL HUMERUS FRACTURE;  Surgeon: Renette Butters, MD;  Location: [facility];  Service:  Orthopedics;  Laterality: Right;  . POLYPECTOMY      Social History   Socioeconomic History  . Marital status: Divorced    Spouse name: Not on file  . Number of children: Not on file  . Years of education: Not on file  . Highest education level: Not on file  Occupational History  . Not on file  Tobacco Use  . Smoking status: Never Smoker  . Smokeless tobacco: Never Used  Vaping Use  . Vaping Use: Never used  Substance and Sexual Activity  . Alcohol use: Yes    Comment: once a year  . Drug use: No  . Sexual activity: Yes    Birth control/protection: Post-menopausal  Other Topics Concern  . Not on file  Social History Narrative  . Not on file   Social Determinants of Health   Financial Resource Strain:   . Difficulty of Paying Living Expenses: Not on file  Food Insecurity:   . Worried About Charity fundraiser in the Last Year: Not on file  . Ran Out of Food in the Last Year: Not on file  Transportation Needs:   . Lack of Transportation (Medical): Not on file  . Lack of Transportation (Non-Medical): Not on file  Physical Activity:   . Days of Exercise per  Week: Not on file  . Minutes of Exercise per Session: Not on file  Stress:   . Feeling of Stress : Not on file  Social Connections:   . Frequency of Communication with Friends and Family: Not on file  . Frequency of Social Gatherings with Friends and Family: Not on file  . Attends Religious Services: Not on file  . Active Member of Clubs or Organizations: Not on file  . Attends Archivist Meetings: Not on file  . Marital Status: Not on file  Intimate Partner Violence:   . Fear of Current or Ex-Partner: Not on file  . Emotionally Abused: Not on file  . Physically Abused: Not on file  . Sexually Abused: Not on file    Current Outpatient Medications on File Prior to Visit  Medication Sig Dispense Refill  . albuterol (PROAIR HFA) 108 (90 Base) MCG/ACT inhaler Inhale 2 puffs into the lungs as needed.  18 g 3  . aspirin EC 81 MG tablet Take 81 mg by mouth daily.    . Aspirin-Acetaminophen-Caffeine (EXCEDRIN EXTRA STRENGTH PO) Take by mouth as needed.    Marland Kitchen atorvastatin (LIPITOR) 80 MG tablet TAKE 1 TABLET BY MOUTH EVERYDAY AT BEDTIME 90 tablet 4  . BAYER CONTOUR NEXT TEST test strip USE TO TEST FOUR TIMES A DAY AS INSTRUCTED.  12  . BD PEN NEEDLE NANO 2ND GEN 32G X 4 MM MISC USE AS DIRECTED 4 TIMES A DAY 100 each 2  . benzonatate (TESSALON PERLES) 100 MG capsule Take 1 capsule (100 mg total) by mouth 3 (three) times daily as needed. 20 capsule 0  . Continuous Blood Gluc Receiver (DEXCOM G6 RECEIVER) DEVI 1 EACH BY DOES NOT APPLY ROUTE SEE ADMIN INSTRUCTIONS. FOR CONTINUOUS GLUCOSE MONITORING E11.9 1 each 0  . Continuous Blood Gluc Sensor (DEXCOM G6 SENSOR) MISC 1 EACH BY DOES NOT APPLY ROUTE SEE ADMIN INSTRUCTIONS CHANGE SENSOR EVERY 10 DAYS E11.9 3 each 2  . Continuous Blood Gluc Transmit (DEXCOM G6 TRANSMITTER) MISC 1 each by Does not apply route See admin instructions. For continuous glucose monitoring; E11.9 1 each 2  . CONTOUR NEXT TEST test strip USE TO TEST 4 TIMES A DAY AS INSTRUCTED 300 each 1  . dapagliflozin propanediol (FARXIGA) 10 MG TABS tablet Take 10 mg by mouth daily. 30 tablet 11  . diphenhydrAMINE (BENADRYL) 25 MG tablet Take 25 mg by mouth every 6 (six) hours as needed for allergies.     Marland Kitchen doxycycline (VIBRA-TABS) 100 MG tablet Take 1 tablet (100 mg total) by mouth 2 (two) times daily. 14 tablet 0  . EPINEPHrine 0.3 mg/0.3 mL IJ SOAJ injection INJECT 0.3 ML INTO THE MUSCLE ONCE 2 mL 0  . Fluticasone-Salmeterol (ADVAIR DISKUS) 250-50 MCG/DOSE AEPB Inhale 1 puff into the lungs 2 (two) times daily. 60 each 5  . hydrocortisone 2.5 % cream Apply topically every evening. (Patient taking differently: Apply topically as needed. ) 30 g 1  . liraglutide (VICTOZA) 18 MG/3ML SOPN INJECT 1.8 MG UNDER THE SKIN ONCE DAILY 9 mL 8  . naproxen (NAPROSYN) 500 MG tablet Take 1 tablet (500 mg  total) by mouth 2 (two) times daily. 60 tablet 1  . predniSONE (DELTASONE) 20 MG tablet Take 2 tablets (40 mg total) by mouth daily with breakfast. 8 tablet 0  . silver sulfADIAZINE (SILVADENE) 1 % cream Apply 1 application topically every evening. 50 g 1  . traMADol (ULTRAM) 50 MG tablet Take 2 tablets (100 mg total) by  mouth every 6 (six) hours as needed for moderate pain. 60 tablet 0  . Vitamin D, Ergocalciferol, (DRISDOL) 1.25 MG (50000 UNIT) CAPS capsule Take 1 capsule (50,000 Units total) by mouth every 7 (seven) days for 12 doses. 12 capsule 0   No current facility-administered medications on file prior to visit.     Family History  Problem Relation Age of Onset  . Colon polyps Mother   . Diabetes Mother   . Heart failure Mother   . Peripheral vascular disease Mother   . Diabetes Father   . Heart disease Father   . Arthritis Father   . Hypertension Father   . Heart failure Father   . Prostate cancer Father   . Arthritis Other   . Hyperlipidemia Other   . Heart disease Other   . Hypertension Other   . Depression Other   . Ovarian cancer Other   . Uterine cancer Other   . Breast cancer Other   . Prostate cancer Other   . Breast cancer Cousin   . Colon cancer Neg Hx   . Stomach cancer Neg Hx   . Rectal cancer Neg Hx   . Pancreatic cancer Neg Hx   . Esophageal cancer Neg Hx     BP 120/80   Pulse 74   Ht 4\' 10"  (1.473 m)   Wt 182 lb (82.6 kg)   SpO2 99%   BMI 38.04 kg/m    Review of Systems Denies LOC.      Objective:   Physical Exam VITAL SIGNS:  See vs page GENERAL: no distress Pulses: dorsalis pedis intact bilat.   MSK: no deformity of the feet CV: trace bilat leg edema Skin:  no ulcer on the feet.  normal color and temp on the feet. Neuro: sensation is intact to touch on the feet.    Lab Results  Component Value Date   HGBA1C 6.9 (A) 12/27/2019       Assessment & Plan:  Insulin-requiring type 2 DM, with DR Hypoglycemia, due to insulin: this  limits aggressiveness of glycemic control   Patient Instructions  Please reduce the Tresiba to 180 units per day.  However, on days off, take just 160 units.  This is a very slow time-release insulin, so you can take it at any time of day.   Please continue the same other diabetes medications.   I have sent Vaughan Basta a message, to ask about the progress towards a pump.   check your blood sugar once a day.  vary the time of day when you check, between before the 3 meals, and at bedtime.  also check if you have symptoms of your blood sugar being too high or too low.  please keep a record of the readings and bring it to your next appointment here (or you can bring the meter itself).  You can write it on any piece of paper.  please call us sooner if your blood sugar goes below 70, or if you have a lot of readings over 200. Please come back for a follow-up appointment in 3 months.

## 2020-04-02 NOTE — Telephone Encounter (Signed)
    Pt would like to inquire to Dr. Radford Pax about using inspire instead of a cpap machine. She said she saw a commercial of it and she is interested to know what Dr. Radford Pax thoughts about it.

## 2020-04-02 NOTE — Telephone Encounter (Signed)
Unfortunately she does not qualify due to her obesity.  Her BMI is 37 and the BMI cutoff is 32

## 2020-04-02 NOTE — Patient Instructions (Addendum)
Please reduce the Tresiba to 180 units per day.  However, on days off, take just 160 units.  This is a very slow time-release insulin, so you can take it at any time of day.   Please continue the same other diabetes medications.   I have sent Vaughan Basta a message, to ask about the progress towards a pump.   check your blood sugar once a day.  vary the time of day when you check, between before the 3 meals, and at bedtime.  also check if you have symptoms of your blood sugar being too high or too low.  please keep a record of the readings and bring it to your next appointment here (or you can bring the meter itself).  You can write it on any piece of paper.  please call us sooner if your blood sugar goes below 70, or if you have a lot of readings over 200. Please come back for a follow-up appointment in 3 months.

## 2020-04-02 NOTE — Telephone Encounter (Signed)
Spoke with the patient and advised her that she does not qualify for [facility] device. Patient verbalized understanding and thanked me for the call.

## 2020-04-03 ENCOUNTER — Other Ambulatory Visit: Payer: Self-pay | Admitting: Internal Medicine

## 2020-04-03 DIAGNOSIS — J4540 Moderate persistent asthma, uncomplicated: Secondary | ICD-10-CM

## 2020-04-03 DIAGNOSIS — J9801 Acute bronchospasm: Secondary | ICD-10-CM

## 2020-04-14 NOTE — Telephone Encounter (Signed)
OK, thanks

## 2020-04-17 ENCOUNTER — Telehealth: Payer: Self-pay | Admitting: *Deleted

## 2020-04-17 DIAGNOSIS — Z0184 Encounter for antibody response examination: Secondary | ICD-10-CM

## 2020-04-17 NOTE — Telephone Encounter (Signed)
Patient called requesting "tighters" patient states she took a new job and she doesn't have her immunization report. Patient requested this to be done soon as possible and a call back from the nurse.

## 2020-04-18 ENCOUNTER — Other Ambulatory Visit: Payer: Self-pay | Admitting: Internal Medicine

## 2020-04-18 DIAGNOSIS — E559 Vitamin D deficiency, unspecified: Secondary | ICD-10-CM

## 2020-04-22 NOTE — Telephone Encounter (Signed)
Left message on machine for patient. Which tighters? Does she need a copy of her vaccines?

## 2020-04-23 NOTE — Addendum Note (Signed)
Addended by: Westley Hummer B on: 04/23/2020 04:41 PM   Modules accepted: Orders

## 2020-04-23 NOTE — Telephone Encounter (Signed)
Labs ordered and appointment scheduled.

## 2020-04-23 NOTE — Telephone Encounter (Signed)
Yes, instead of PPD let's do a quantiferon gold

## 2020-04-23 NOTE — Telephone Encounter (Signed)
Okay to order?

## 2020-04-23 NOTE — Telephone Encounter (Signed)
Pt called in stating that she needs the following titers MMR, Varicella, Tdap, Hep-B and PPD and would like to be called to let her know when she can get them done.

## 2020-04-25 ENCOUNTER — Other Ambulatory Visit: Payer: Self-pay

## 2020-04-25 ENCOUNTER — Other Ambulatory Visit (INDEPENDENT_AMBULATORY_CARE_PROVIDER_SITE_OTHER): Payer: BC Managed Care – PPO

## 2020-04-25 DIAGNOSIS — Z0184 Encounter for antibody response examination: Secondary | ICD-10-CM | POA: Diagnosis not present

## 2020-04-25 NOTE — Addendum Note (Signed)
Addended by: Marrion Coy on: 04/25/2020 02:12 PM   Modules accepted: Orders

## 2020-04-26 LAB — QUANTIFERON-TB GOLD PLUS
Mitogen-NIL: 8.7 [IU]/mL
NIL: 0.02 [IU]/mL
QuantiFERON-TB Gold Plus: NEGATIVE
TB1-NIL: 0.01 [IU]/mL
TB2-NIL: 0 [IU]/mL

## 2020-04-28 LAB — MEASLES/MUMPS/RUBELLA IMMUNITY
Mumps IgG: 253 AU/mL
Rubella: 1.83 Index
Rubeola IgG: >300 AU/mL

## 2020-04-28 LAB — TETANUS ANTIBODY, IGG: Tetanus Toxin Antibody, Total: 1.66 IU/mL

## 2020-04-28 LAB — HEPATITIS B SURFACE ANTIBODY, QUANTITATIVE: Hep B S AB Quant (Post): 43 m[IU]/mL (ref >=10)

## 2020-04-28 LAB — VARICELLA ZOSTER ANTIBODY, IGG: Varicella IgG: 2204 index

## 2020-05-01 DIAGNOSIS — M5431 Sciatica, right side: Secondary | ICD-10-CM | POA: Diagnosis not present

## 2020-05-01 DIAGNOSIS — M2550 Pain in unspecified joint: Secondary | ICD-10-CM | POA: Diagnosis not present

## 2020-05-01 DIAGNOSIS — Z7409 Other reduced mobility: Secondary | ICD-10-CM | POA: Diagnosis not present

## 2020-05-01 DIAGNOSIS — M5432 Sciatica, left side: Secondary | ICD-10-CM | POA: Diagnosis not present

## 2020-05-06 NOTE — Telephone Encounter (Signed)
Talked with pump drug rep.  They received second set of orders for pump and are processing.  Pt. Was told to call [employer] in 1-2 more days if she does not hear from them.  Telephone number given

## 2020-05-06 NOTE — Telephone Encounter (Signed)
Patient called and wanted to know the status of getting pump - see previous messages  Phone# [phone]

## 2020-05-16 ENCOUNTER — Telehealth: Payer: Self-pay | Admitting: Endocrinology

## 2020-05-16 ENCOUNTER — Encounter: Payer: Self-pay | Admitting: Internal Medicine

## 2020-05-16 ENCOUNTER — Telehealth (INDEPENDENT_AMBULATORY_CARE_PROVIDER_SITE_OTHER): Payer: BC Managed Care – PPO | Admitting: Internal Medicine

## 2020-05-16 DIAGNOSIS — L309 Dermatitis, unspecified: Secondary | ICD-10-CM

## 2020-05-16 MED ORDER — TRIAMCINOLONE ACETONIDE 0.1 % EX CREA: 1.0000 "application " | TOPICAL_CREAM | Freq: Two times a day (BID) | CUTANEOUS | 0 refills | Status: DC

## 2020-05-16 NOTE — Progress Notes (Signed)
Virtual Visit via Video Note  I connected with@ on 05/16/20 at  2:00 PM EST by a video enabled telemedicine application and verified that I am speaking with the correct person using two identifiers. Location patient: home Location provider:work  office Persons participating in the virtual visit: patient, provider  [place] national recommendations  regarding COVID 19 pandemic   video visit is advised over in office visit for this patient.  Patient aware  of the limitations of evaluation and management by telemedicine and  availability of in person appointments. and agreed to proceed.   HPI: Renee Lam presents for video visit presents with a large itchy rash under her Dexcom patch that she has not really had before began yesterday when she change the patch. She is not systemically ill there is no pain or weeping.  The redness and itching is down from yesterday but needs advice. She is otherwise medically stable.  No history of rash with contact dermatitis per se ROS: See pertinent positives and negatives per HPI.  Past Medical History:  Diagnosis Date  . Allergy   . Arthritis   . Asthma   . Chicken pox   . Diabetes mellitus   . Finger infection 2008   isolation  . GERD (gastroesophageal reflux disease)   . Heart murmur    benign per patient  . Hyperlipidemia   . Hypertension    past hx   . Ischemic optic neuropathy    right eye  . Migraine   . OSA (obstructive sleep apnea) 10/25/2016  . Osteoporosis   . Pericarditis   . Right sided sciatica 06/13/2017  . Sleep apnea    Patient is scheduled for sleep study tonight- wears cpap  . Viral meningitis     Past Surgical History:  Procedure Laterality Date  . CARDIAC CATHETERIZATION  2012  . CESAREAN SECTION     1997, 2000  . COLONOSCOPY    . KNEE ARTHROSCOPY Right 2010  . MENISCUS REPAIR Left 10/2017  . ORIF HUMERUS FRACTURE Right 03/25/2015   Procedure: OPEN REDUCTION INTERNAL FIXATION (ORIF) DISTAL HUMERUS  FRACTURE;  Surgeon: Renette Butters, MD;  Location: [facility];  Service: Orthopedics;  Laterality: Right;  . POLYPECTOMY      Family History  Problem Relation Age of Onset  . Colon polyps Mother   . Diabetes Mother   . Heart failure Mother   . Peripheral vascular disease Mother   . Diabetes Father   . Heart disease Father   . Arthritis Father   . Hypertension Father   . Heart failure Father   . Prostate cancer Father   . Arthritis Other   . Hyperlipidemia Other   . Heart disease Other   . Hypertension Other   . Depression Other   . Ovarian cancer Other   . Uterine cancer Other   . Breast cancer Other   . Prostate cancer Other   . Breast cancer Cousin   . Colon cancer Neg Hx   . Stomach cancer Neg Hx   . Rectal cancer Neg Hx   . Pancreatic cancer Neg Hx   . Esophageal cancer Neg Hx     Social History   Tobacco Use  . Smoking status: Never Smoker  . Smokeless tobacco: Never Used  Vaping Use  . Vaping Use: Never used  Substance Use Topics  . Alcohol use: Yes    Comment: once a year  . Drug use: No      Current  Outpatient Medications:  .  albuterol (PROAIR HFA) 108 (90 Base) MCG/ACT inhaler, Inhale 2 puffs into the lungs as needed., Disp: 18 g, Rfl: 3 .  aspirin EC 81 MG tablet, Take 81 mg by mouth daily., Disp: , Rfl:  .  Aspirin-Acetaminophen-Caffeine (EXCEDRIN EXTRA STRENGTH PO), Take by mouth as needed., Disp: , Rfl:  .  atorvastatin (LIPITOR) 80 MG tablet, TAKE 1 TABLET BY MOUTH EVERYDAY AT BEDTIME, Disp: 90 tablet, Rfl: 4 .  BAYER CONTOUR NEXT TEST test strip, USE TO TEST FOUR TIMES A DAY AS INSTRUCTED., Disp: , Rfl: 12 .  BD PEN NEEDLE NANO 2ND GEN 32G X 4 MM MISC, USE AS DIRECTED 4 TIMES A DAY, Disp: 100 each, Rfl: 2 .  benzonatate (TESSALON PERLES) 100 MG capsule, Take 1 capsule (100 mg total) by mouth 3 (three) times daily as needed., Disp: 20 capsule, Rfl: 0 .  Continuous Blood Gluc Receiver (DEXCOM G6 RECEIVER) DEVI, 1 EACH BY DOES NOT  APPLY ROUTE SEE ADMIN INSTRUCTIONS. FOR CONTINUOUS GLUCOSE MONITORING E11.9, Disp: 1 each, Rfl: 0 .  Continuous Blood Gluc Sensor (DEXCOM G6 SENSOR) MISC, 1 EACH BY DOES NOT APPLY ROUTE SEE ADMIN INSTRUCTIONS CHANGE SENSOR EVERY 10 DAYS E11.9, Disp: 3 each, Rfl: 2 .  Continuous Blood Gluc Transmit (DEXCOM G6 TRANSMITTER) MISC, 1 each by Does not apply route See admin instructions. For continuous glucose monitoring; E11.9, Disp: 1 each, Rfl: 2 .  CONTOUR NEXT TEST test strip, USE TO TEST 4 TIMES A DAY AS INSTRUCTED, Disp: 300 each, Rfl: 1 .  dapagliflozin propanediol (FARXIGA) 10 MG TABS tablet, Take 10 mg by mouth daily., Disp: 30 tablet, Rfl: 11 .  diphenhydrAMINE (BENADRYL) 25 MG tablet, Take 25 mg by mouth every 6 (six) hours as needed for allergies. , Disp: , Rfl:  .  doxycycline (VIBRA-TABS) 100 MG tablet, Take 1 tablet (100 mg total) by mouth 2 (two) times daily., Disp: 14 tablet, Rfl: 0 .  EPINEPHrine 0.3 mg/0.3 mL IJ SOAJ injection, INJECT 0.3 ML INTO THE MUSCLE ONCE, Disp: 2 mL, Rfl: 0 .  Fluticasone-Salmeterol (ADVAIR DISKUS) 250-50 MCG/DOSE AEPB, Inhale 1 puff into the lungs 2 (two) times daily., Disp: 60 each, Rfl: 5 .  hydrocortisone 2.5 % cream, Apply topically every evening. (Patient taking differently: Apply topically as needed. ), Disp: 30 g, Rfl: 1 .  insulin degludec (TRESIBA FLEXTOUCH) 200 UNIT/ML FlexTouch Pen, Inject 180 Units into the skin daily., Disp: 45 mL, Rfl: 3 .  liraglutide (VICTOZA) 18 MG/3ML SOPN, INJECT 1.8 MG UNDER THE SKIN ONCE DAILY, Disp: 9 mL, Rfl: 8 .  montelukast (SINGULAIR) 10 MG tablet, TAKE 1 TABLET BY MOUTH EVERYDAY AT BEDTIME, Disp: 90 tablet, Rfl: 2 .  naproxen (NAPROSYN) 500 MG tablet, Take 1 tablet (500 mg total) by mouth 2 (two) times daily., Disp: 60 tablet, Rfl: 1 .  predniSONE (DELTASONE) 20 MG tablet, Take 2 tablets (40 mg total) by mouth daily with breakfast., Disp: 8 tablet, Rfl: 0 .  silver sulfADIAZINE (SILVADENE) 1 % cream, Apply 1 application  topically every evening., Disp: 50 g, Rfl: 1 .  traMADol (ULTRAM) 50 MG tablet, Take 2 tablets (100 mg total) by mouth every 6 (six) hours as needed for moderate pain., Disp: 60 tablet, Rfl: 0 .  triamcinolone (KENALOG) 0.1 %, Apply 1 application topically 2 (two) times daily. To rash, Disp: 30 g, Rfl: 0  EXAM: BP Readings from Last 3 Encounters:  04/02/20 120/80  02/26/20 117/69  01/30/20 120/78  VITALS per patient if applicable:  GENERAL: alert, oriented, appears well and in no acute distress  HEENT: atraumatic, conjunttiva clear, no obvious abnormalities on inspection of external nose and ears  NECK: normal movements of the head and neck  LUNGS: on inspection no signs of respiratory distress, breathing rate appears normal, no obvious gross SOB, gasping or wheezing  CV: no obvious cyanosis  Examination of skin on the abdomen right lateral shows a large red in square but redness around +3 without edema and no obvious vesicles.  PSYCH/NEURO: pleasant and cooperative, no obvious depression or anxiety, speech and thought processing grossly intact Lab Results  Component Value Date   WBC 10.1 01/30/2020   HGB 13.8 01/30/2020   HCT 43.5 01/30/2020   PLT 435 (H) 01/30/2020   GLUCOSE 124 (H) 01/30/2020   CHOL 166 05/23/2018   TRIG 134.0 05/23/2018   HDL 58.20 05/23/2018   LDLCALC 81 05/23/2018   ALT 32 (H) 01/30/2020   AST 17 01/30/2020   NA 140 01/30/2020   K 4.2 01/30/2020   CL 105 01/30/2020   CREATININE 0.64 01/30/2020   BUN 22 01/30/2020   CO2 24 01/30/2020   TSH 2.08 01/30/2020   HGBA1C 6.7 (A) 04/02/2020   MICROALBUR 1.8 05/23/2018    ASSESSMENT AND PLAN:  Discussed the following assessment and plan:    ICD-10-CM   1. Dermatitis due to dexcom patch  L30.9    seems allergic itchy    and no obv infection  improved but impressie erythema   Local care can add topical tmc    No sx of infection  But follow   And avoid that site  As long as possible even after  healed in case local sensitization . If ongoin issues  Contact Endocrinology/Pharmacy  team  Counseled.   Expectant management and discussion of plan and treatment with opportunity to ask questions and all were answered. The patient agreed with the plan and demonstrated an understanding of the instructions.   Advised to call back or seek an in-person evaluation if worsening  or having  further concerns . Return if symptoms worsen or fail to improve as expected.   Shanon Ace, MD

## 2020-05-16 NOTE — Telephone Encounter (Signed)
please contact patient: I heard about the symptoms.  Do you want to see Renee Lam?

## 2020-05-19 NOTE — Telephone Encounter (Signed)
Called patient, she is having issues of a rash under her sensor- she states she would like to see Renee Lam- advised I would route a message to her for her to schedule patient. Patient was thankful for call back.

## 2020-05-20 NOTE — Telephone Encounter (Signed)
Phoned patient X2.  No answer, no voice mail.

## 2020-05-21 ENCOUNTER — Encounter: Payer: BC Managed Care – PPO | Admitting: Internal Medicine

## 2020-05-22 ENCOUNTER — Other Ambulatory Visit: Payer: Self-pay | Admitting: Endocrinology

## 2020-05-22 DIAGNOSIS — E119 Type 2 diabetes mellitus without complications: Secondary | ICD-10-CM

## 2020-05-27 NOTE — Telephone Encounter (Signed)
LVM to call me to discuss skin problem.

## 2020-05-28 ENCOUNTER — Telehealth: Payer: Self-pay | Admitting: Nutrition

## 2020-05-28 ENCOUNTER — Other Ambulatory Visit: Payer: Self-pay | Admitting: Endocrinology

## 2020-05-28 DIAGNOSIS — M2559 Pain in other specified joint: Secondary | ICD-10-CM | POA: Diagnosis not present

## 2020-05-28 DIAGNOSIS — M5432 Sciatica, left side: Secondary | ICD-10-CM | POA: Diagnosis not present

## 2020-05-28 DIAGNOSIS — Z7409 Other reduced mobility: Secondary | ICD-10-CM | POA: Diagnosis not present

## 2020-05-28 DIAGNOSIS — M5431 Sciatica, right side: Secondary | ICD-10-CM | POA: Diagnosis not present

## 2020-05-28 NOTE — Telephone Encounter (Signed)
Opened in error

## 2020-05-28 NOTE — Telephone Encounter (Signed)
Discussed barrier wipes she can use to go between her skin and the sensor.  Skin Tac she can order from [employer] or tegaderm or other clear dressings she can get at the drug store.  Pt. Has tegaderm dressings at home and will try this, and also order the Sink tac wipes to see what will work better for her.  She will call back,if neither of these work for her.

## 2020-06-25 ENCOUNTER — Encounter: Payer: Self-pay | Admitting: Endocrinology

## 2020-06-25 ENCOUNTER — Other Ambulatory Visit: Payer: Self-pay

## 2020-06-25 ENCOUNTER — Ambulatory Visit: Payer: BC Managed Care – PPO | Admitting: Endocrinology

## 2020-06-25 VITALS — BP 120/86 | HR 76 | Ht 59.0 in | Wt 182.0 lb

## 2020-06-25 DIAGNOSIS — E119 Type 2 diabetes mellitus without complications: Secondary | ICD-10-CM | POA: Diagnosis not present

## 2020-06-25 LAB — POCT GLYCOSYLATED HEMOGLOBIN (HGB A1C): Hemoglobin A1C: 6.6 % — AB (ref 4.0–5.6)

## 2020-06-25 MED ORDER — VICTOZA 18 MG/3ML ~~LOC~~ SOPN: PEN_INJECTOR | SUBCUTANEOUS | 8 refills | Status: DC

## 2020-06-25 MED ORDER — TRESIBA FLEXTOUCH 200 UNIT/ML ~~LOC~~ SOPN: 190.0000 [IU] | PEN_INJECTOR | Freq: Every day | SUBCUTANEOUS | 3 refills | Status: DC

## 2020-06-25 MED ORDER — DAPAGLIFLOZIN PROPANEDIOL 10 MG PO TABS: 10.0000 mg | ORAL_TABLET | Freq: Every day | ORAL | 3 refills | Status: DC

## 2020-06-25 NOTE — Progress Notes (Signed)
Subjective:    Patient ID: Renee Lam, female    DOB: Dec 29, 1960, 60 y.o.   MRN: [id number]  HPI Pt returns for f/u of diabetes mellitus: DM type: Insulin-requiring type 2.  Dx'ed: [id number] Complications: DR.  Therapy: insulin since 2010, Farxiga, and Victoza.  GDM: 1996 and 1999.  DKA: never.  Severe hypoglycemia: never.     Pancreatitis: never.   SDOH: she works 3rd shift, as a Marine scientist; Other: she takes QD insulin, after poor results with multiple daily injections; she did not tolerate metformin (2013-2017--stopped due to GI upset).   Interval history:  She says cbg on work days=days off.  She takes meds as rx'ed.  I reviewed continuous glucose monitor data. Glucose varies from 55-230.  It is in general lowest in the middle of the night, but there is little trend throughout the day.  She seldom has hypoglycemia, and these episodes are mild.  She increased to 200 units qd, due to cbg's in the 300's.  She has delayed pump for now.   Past Medical History:  Diagnosis Date  . Allergy   . Arthritis   . Asthma   . Chicken pox   . Diabetes mellitus   . Finger infection 2008   isolation  . GERD (gastroesophageal reflux disease)   . Heart murmur    benign per patient  . Hyperlipidemia   . Hypertension    past hx   . Ischemic optic neuropathy    right eye  . Migraine   . OSA (obstructive sleep apnea) 10/25/2016  . Osteoporosis   . Pericarditis   . Right sided sciatica 06/13/2017  . Sleep apnea    Patient is scheduled for sleep study tonight- wears cpap  . Viral meningitis     Past Surgical History:  Procedure Laterality Date  . CARDIAC CATHETERIZATION  2012  . CESAREAN SECTION     1997, 2000  . COLONOSCOPY    . KNEE ARTHROSCOPY Right 2010  . MENISCUS REPAIR Left 10/2017  . ORIF HUMERUS FRACTURE Right 03/25/2015   Procedure: OPEN REDUCTION INTERNAL FIXATION (ORIF) DISTAL HUMERUS FRACTURE;  Surgeon: Renette Butters, MD;  Location: [facility];  Service:  Orthopedics;  Laterality: Right;  . POLYPECTOMY      Social History   Socioeconomic History  . Marital status: Divorced    Spouse name: Not on file  . Number of children: Not on file  . Years of education: Not on file  . Highest education level: Not on file  Occupational History  . Not on file  Tobacco Use  . Smoking status: Never Smoker  . Smokeless tobacco: Never Used  Vaping Use  . Vaping Use: Never used  Substance and Sexual Activity  . Alcohol use: Yes    Comment: once a year  . Drug use: No  . Sexual activity: Yes    Birth control/protection: Post-menopausal  Other Topics Concern  . Not on file  Social History Narrative  . Not on file   Social Determinants of Health   Financial Resource Strain: Not on file  Food Insecurity: Not on file  Transportation Needs: Not on file  Physical Activity: Not on file  Stress: Not on file  Social Connections: Not on file  Intimate Partner Violence: Not on file    Current Outpatient Medications on File Prior to Visit  Medication Sig Dispense Refill  . albuterol (PROAIR HFA) 108 (90 Base) MCG/ACT inhaler Inhale 2 puffs into the lungs as needed.  18 g 3  . aspirin EC 81 MG tablet Take 81 mg by mouth daily.    . Aspirin-Acetaminophen-Caffeine (EXCEDRIN EXTRA STRENGTH PO) Take by mouth as needed.    Marland Kitchen atorvastatin (LIPITOR) 80 MG tablet TAKE 1 TABLET BY MOUTH EVERYDAY AT BEDTIME 90 tablet 4  . BAYER CONTOUR NEXT TEST test strip USE TO TEST FOUR TIMES A DAY AS INSTRUCTED.  12  . BD PEN NEEDLE NANO 2ND GEN 32G X 4 MM MISC USE AS DIRECTED 4 TIMES A DAY 100 each 2  . benzonatate (TESSALON PERLES) 100 MG capsule Take 1 capsule (100 mg total) by mouth 3 (three) times daily as needed. 20 capsule 0  . Continuous Blood Gluc Receiver (DEXCOM G6 RECEIVER) DEVI 1 EACH BY DOES NOT APPLY ROUTE SEE ADMIN INSTRUCTIONS. FOR CONTINUOUS GLUCOSE MONITORING E11.9 1 each 0  . Continuous Blood Gluc Sensor (DEXCOM G6 SENSOR) MISC 1 EACH BY DOES NOT APPLY  ROUTE SEE ADMIN INSTRUCTIONS CHANGE SENSOR EVERY 10 DAYS E11.9 3 each 2  . Continuous Blood Gluc Transmit (DEXCOM G6 TRANSMITTER) MISC 1 EACH BY DOES NOT APPLY ROUTE SEE ADMIN INSTRUCTIONS. FOR CONTINUOUS GLUCOSE MONITORING E11.9 1 each 2  . CONTOUR NEXT TEST test strip USE TO TEST 4 TIMES A DAY AS INSTRUCTED 300 each 1  . diphenhydrAMINE (BENADRYL) 25 MG tablet Take 25 mg by mouth every 6 (six) hours as needed for allergies.     Marland Kitchen EPINEPHrine 0.3 mg/0.3 mL IJ SOAJ injection INJECT 0.3 ML INTO THE MUSCLE ONCE 2 mL 0  . Fluticasone-Salmeterol (ADVAIR DISKUS) 250-50 MCG/DOSE AEPB Inhale 1 puff into the lungs 2 (two) times daily. 60 each 5  . hydrocortisone 2.5 % cream Apply topically every evening. (Patient taking differently: Apply topically as needed.) 30 g 1  . montelukast (SINGULAIR) 10 MG tablet TAKE 1 TABLET BY MOUTH EVERYDAY AT BEDTIME 90 tablet 2  . naproxen (NAPROSYN) 500 MG tablet Take 1 tablet (500 mg total) by mouth 2 (two) times daily. 60 tablet 1  . predniSONE (DELTASONE) 20 MG tablet Take 2 tablets (40 mg total) by mouth daily with breakfast. 8 tablet 0  . silver sulfADIAZINE (SILVADENE) 1 % cream Apply 1 application topically every evening. 50 g 1  . traMADol (ULTRAM) 50 MG tablet Take 2 tablets (100 mg total) by mouth every 6 (six) hours as needed for moderate pain. 60 tablet 0  . triamcinolone (KENALOG) 0.1 % Apply 1 application topically 2 (two) times daily. To rash 30 g 0   No current facility-administered medications on file prior to visit.     Family History  Problem Relation Age of Onset  . Colon polyps Mother   . Diabetes Mother   . Heart failure Mother   . Peripheral vascular disease Mother   . Diabetes Father   . Heart disease Father   . Arthritis Father   . Hypertension Father   . Heart failure Father   . Prostate cancer Father   . Arthritis Other   . Hyperlipidemia Other   . Heart disease Other   . Hypertension Other   . Depression Other   . Ovarian cancer  Other   . Uterine cancer Other   . Breast cancer Other   . Prostate cancer Other   . Breast cancer Cousin   . Colon cancer Neg Hx   . Stomach cancer Neg Hx   . Rectal cancer Neg Hx   . Pancreatic cancer Neg Hx   . Esophageal cancer Neg Hx  BP 120/86   Pulse 76   Ht 4\' 11"  (1.499 m)   Wt 182 lb (82.6 kg)   SpO2 99%   BMI 36.76 kg/m    Review of Systems Denies LOC    Objective:   Physical Exam VITAL SIGNS:  See vs page GENERAL: no distress Pulses: dorsalis pedis intact bilat.   MSK: no deformity of the feet CV: trace bilat leg edema Skin:  no ulcer on the feet.  normal color and temp on the feet.  Neuro: sensation is intact to touch on the feet.   Lab Results  Component Value Date   HGBA1C 6.6 (A) 06/25/2020       Assessment & Plan:  Insulin-requiring type 2 DM, with DR: overcontrolled.  Hypoglycemia, due to insulin.   Patient Instructions  Please reduce the Tresiba to 190 units per day, on all days.  This is a very slow time-release insulin, so you can take it at any time of day.   Please continue the same other diabetes medications.   check your blood sugar once a day.  vary the time of day when you check, between before the 3 meals, and at bedtime.  also check if you have symptoms of your blood sugar being too high or too low.  please keep a record of the readings and bring it to your next appointment here (or you can bring the meter itself).  You can write it on any piece of paper.  please call us sooner if your blood sugar goes below 70, or if you have a lot of readings over 200. Please come back for a follow-up appointment in 3 months.

## 2020-06-25 NOTE — Patient Instructions (Addendum)
Please reduce the Tresiba to 190 units per day, on all days.  This is a very slow time-release insulin, so you can take it at any time of day.   Please continue the same other diabetes medications.   check your blood sugar once a day.  vary the time of day when you check, between before the 3 meals, and at bedtime.  also check if you have symptoms of your blood sugar being too high or too low.  please keep a record of the readings and bring it to your next appointment here (or you can bring the meter itself).  You can write it on any piece of paper.  please call us sooner if your blood sugar goes below 70, or if you have a lot of readings over 200. Please come back for a follow-up appointment in 3 months.

## 2020-07-02 ENCOUNTER — Ambulatory Visit: Payer: BC Managed Care – PPO | Admitting: Internal Medicine

## 2020-07-02 ENCOUNTER — Other Ambulatory Visit: Payer: BC Managed Care – PPO

## 2020-07-02 ENCOUNTER — Other Ambulatory Visit: Payer: Self-pay

## 2020-07-02 ENCOUNTER — Encounter: Payer: Self-pay | Admitting: Internal Medicine

## 2020-07-02 VITALS — BP 110/80 | HR 79 | Temp 98.0°F | Wt 182.0 lb

## 2020-07-02 DIAGNOSIS — R231 Pallor: Secondary | ICD-10-CM

## 2020-07-02 DIAGNOSIS — J069 Acute upper respiratory infection, unspecified: Secondary | ICD-10-CM | POA: Diagnosis not present

## 2020-07-02 DIAGNOSIS — E1121 Type 2 diabetes mellitus with diabetic nephropathy: Secondary | ICD-10-CM | POA: Diagnosis not present

## 2020-07-02 DIAGNOSIS — R209 Unspecified disturbances of skin sensation: Secondary | ICD-10-CM

## 2020-07-02 NOTE — Progress Notes (Signed)
Established Patient Office Visit     This visit occurred during the SARS-CoV-2 public health emergency.  Safety protocols were in place, including screening questions prior to the visit, additional usage of staff PPE, and extensive cleaning of exam room while observing appropriate contact time as indicated for disinfecting solutions.    CC/Reason for Visit: Discuss cold sensation in her legs  HPI: Renee Lam is a 60 y.o. female who is coming in today for the above mentioned reasons.  She has been feeling from her knee down on both sides a cold sensation no matter the amount of socks or heating that she has on.  She is concerned because her mother had circulation issues.  She has not noticed skin color changes or pain of her legs other than her classic numbness from neuropathy.  Her diabetes is followed by endocrinology, recently she had insulin dose decreased due to significant hypoglycemia.  As we are ending her visit she tells me that she just had a COVID PCR test today as her work required it in order to go back, she has been having some mild runny nose and congestion over the past 3 days.   Past Medical/Surgical History: Past Medical History:  Diagnosis Date  . Allergy   . Arthritis   . Asthma   . Chicken pox   . Diabetes mellitus   . Finger infection 2008   isolation  . GERD (gastroesophageal reflux disease)   . Heart murmur    benign per patient  . Hyperlipidemia   . Hypertension    past hx   . Ischemic optic neuropathy    right eye  . Migraine   . OSA (obstructive sleep apnea) 10/25/2016  . Osteoporosis   . Pericarditis   . Right sided sciatica 06/13/2017  . Sleep apnea    Patient is scheduled for sleep study tonight- wears cpap  . Viral meningitis     Past Surgical History:  Procedure Laterality Date  . CARDIAC CATHETERIZATION  2012  . CESAREAN SECTION     1997, 2000  . COLONOSCOPY    . KNEE ARTHROSCOPY Right 2010  . MENISCUS REPAIR Left 10/2017   . ORIF HUMERUS FRACTURE Right 03/25/2015   Procedure: OPEN REDUCTION INTERNAL FIXATION (ORIF) DISTAL HUMERUS FRACTURE;  Surgeon: Renette Butters, MD;  Location: [facility];  Service: Orthopedics;  Laterality: Right;  . POLYPECTOMY      Social History:  reports that she has never smoked. She has never used smokeless tobacco. She reports current alcohol use. She reports that she does not use drugs.  Allergies: Allergies  Allergen Reactions  . Metformin And Related Nausea And Vomiting  . Food     Strawberries, mangoes, pineapple, eggplant, zucchini, macadamia nuts   . Penicillins Hives    Has patient had a PCN reaction causing immediate rash, facial/tongue/throat swelling, SOB or lightheadedness with hypotension: NoNo Has patient had a PCN reaction causing severe rash involving mucus membranes or skin necrosis: NoNo Has patient had a PCN reaction that required hospitalization NoNo Has patient had a PCN reaction occurring within the last 10 years: NoNo If all of the above answers are "NO", then may proceed with Cephalosporin   . Zithromax [Azithromycin] Hives    z-max; states can take Z pack    Family History:  Family History  Problem Relation Age of Onset  . Colon polyps Mother   . Diabetes Mother   . Heart failure Mother   .  Peripheral vascular disease Mother   . Diabetes Father   . Heart disease Father   . Arthritis Father   . Hypertension Father   . Heart failure Father   . Prostate cancer Father   . Arthritis Other   . Hyperlipidemia Other   . Heart disease Other   . Hypertension Other   . Depression Other   . Ovarian cancer Other   . Uterine cancer Other   . Breast cancer Other   . Prostate cancer Other   . Breast cancer Cousin   . Colon cancer Neg Hx   . Stomach cancer Neg Hx   . Rectal cancer Neg Hx   . Pancreatic cancer Neg Hx   . Esophageal cancer Neg Hx      Current Outpatient Medications:  .  albuterol (PROAIR HFA) 108 (90 Base)  MCG/ACT inhaler, Inhale 2 puffs into the lungs as needed., Disp: 18 g, Rfl: 3 .  aspirin EC 81 MG tablet, Take 81 mg by mouth daily., Disp: , Rfl:  .  Aspirin-Acetaminophen-Caffeine (EXCEDRIN EXTRA STRENGTH PO), Take by mouth as needed., Disp: , Rfl:  .  atorvastatin (LIPITOR) 80 MG tablet, TAKE 1 TABLET BY MOUTH EVERYDAY AT BEDTIME, Disp: 90 tablet, Rfl: 4 .  BAYER CONTOUR NEXT TEST test strip, USE TO TEST FOUR TIMES A DAY AS INSTRUCTED., Disp: , Rfl: 12 .  BD PEN NEEDLE NANO 2ND GEN 32G X 4 MM MISC, USE AS DIRECTED 4 TIMES A DAY, Disp: 100 each, Rfl: 2 .  Continuous Blood Gluc Receiver (DEXCOM G6 RECEIVER) DEVI, 1 EACH BY DOES NOT APPLY ROUTE SEE ADMIN INSTRUCTIONS. FOR CONTINUOUS GLUCOSE MONITORING E11.9, Disp: 1 each, Rfl: 0 .  Continuous Blood Gluc Sensor (DEXCOM G6 SENSOR) MISC, 1 EACH BY DOES NOT APPLY ROUTE SEE ADMIN INSTRUCTIONS CHANGE SENSOR EVERY 10 DAYS E11.9, Disp: 3 each, Rfl: 2 .  Continuous Blood Gluc Transmit (DEXCOM G6 TRANSMITTER) MISC, 1 EACH BY DOES NOT APPLY ROUTE SEE ADMIN INSTRUCTIONS. FOR CONTINUOUS GLUCOSE MONITORING E11.9, Disp: 1 each, Rfl: 2 .  CONTOUR NEXT TEST test strip, USE TO TEST 4 TIMES A DAY AS INSTRUCTED, Disp: 300 each, Rfl: 1 .  dapagliflozin propanediol (FARXIGA) 10 MG TABS tablet, Take 1 tablet (10 mg total) by mouth daily., Disp: 90 tablet, Rfl: 3 .  diphenhydrAMINE (BENADRYL) 25 MG tablet, Take 25 mg by mouth every 6 (six) hours as needed for allergies. , Disp: , Rfl:  .  EPINEPHrine 0.3 mg/0.3 mL IJ SOAJ injection, INJECT 0.3 ML INTO THE MUSCLE ONCE, Disp: 2 mL, Rfl: 0 .  Fluticasone-Salmeterol (ADVAIR DISKUS) 250-50 MCG/DOSE AEPB, Inhale 1 puff into the lungs 2 (two) times daily., Disp: 60 each, Rfl: 5 .  hydrocortisone 2.5 % cream, Apply topically every evening. (Patient taking differently: Apply topically as needed.), Disp: 30 g, Rfl: 1 .  insulin degludec (TRESIBA FLEXTOUCH) 200 UNIT/ML FlexTouch Pen, Inject 190 Units into the skin daily., Disp: 66 mL,  Rfl: 3 .  liraglutide (VICTOZA) 18 MG/3ML SOPN, INJECT 1.8 MG UNDER THE SKIN ONCE DAILY, Disp: 9 mL, Rfl: 8 .  montelukast (SINGULAIR) 10 MG tablet, TAKE 1 TABLET BY MOUTH EVERYDAY AT BEDTIME, Disp: 90 tablet, Rfl: 2 .  naproxen (NAPROSYN) 500 MG tablet, Take 1 tablet (500 mg total) by mouth 2 (two) times daily., Disp: 60 tablet, Rfl: 1 .  predniSONE (DELTASONE) 20 MG tablet, Take 2 tablets (40 mg total) by mouth daily with breakfast., Disp: 8 tablet, Rfl: 0 .  silver sulfADIAZINE (  SILVADENE) 1 % cream, Apply 1 application topically every evening., Disp: 50 g, Rfl: 1 .  traMADol (ULTRAM) 50 MG tablet, Take 2 tablets (100 mg total) by mouth every 6 (six) hours as needed for moderate pain., Disp: 60 tablet, Rfl: 0 .  triamcinolone (KENALOG) 0.1 %, Apply 1 application topically 2 (two) times daily. To rash, Disp: 30 g, Rfl: 0  Review of Systems:  Constitutional: Denies fever, chills, diaphoresis, appetite change and fatigue.  HEENT: Denies photophobia, eye pain, redness, hearing loss, ear pain,  sneezing, mouth sores, trouble swallowing, neck pain, neck stiffness and tinnitus.   Respiratory: Denies SOB, DOE, cough, chest tightness,  and wheezing.   Cardiovascular: Denies chest pain, palpitations and leg swelling.  Gastrointestinal: Denies nausea, vomiting, abdominal pain, diarrhea, constipation, blood in stool and abdominal distention.  Genitourinary: Denies dysuria, urgency, frequency, hematuria, flank pain and difficulty urinating.  Endocrine: Denies: hot or cold intolerance, sweats, changes in hair or nails, polyuria, polydipsia. Musculoskeletal: Denies myalgias, back pain, joint swelling, arthralgias and gait problem.  Skin: Denies pallor, rash and wound.  Neurological: Denies dizziness, seizures, syncope, weakness, light-headedness, numbness and headaches.  Hematological: Denies adenopathy. Easy bruising, personal or family bleeding history  Psychiatric/Behavioral: Denies suicidal ideation,  mood changes, confusion, nervousness, sleep disturbance and agitation    Physical Exam: Vitals:   07/02/20 1033  BP: 110/80  Pulse: 79  Temp: 98 F (36.7 C)  TempSrc: Oral  SpO2: 98%  Weight: 182 lb (82.6 kg)    Body mass index is 36.76 kg/m.   Constitutional: NAD, calm, comfortable Eyes: PERRL, lids and conjunctivae normal ENMT: Mucous membranes are moist.  Respiratory: clear to auscultation bilaterally, no wheezing, no crackles. Normal respiratory effort. No accessory muscle use.  Cardiovascular: Regular rate and rhythm, no murmurs / rubs / gallops. No extremity edema.  Positive DP and TP pulses. Neurologic: Grossly intact and nonfocal. Psychiatric: Normal judgment and insight. Alert and oriented x 3. Normal mood.    Impression and Plan:  Cold and clammy skin -Her extremities do feel a little cold, she has strong DP and TP pulses bilaterally.  She has no skin color changes, no pain. -Continue observation for now.  Do not feel like arterial Dopplers are necessary given strong pulses.  Viral URI -She is awaiting a PCR test result, she will notify us if positive.  Controlled type 2 diabetes mellitus with diabetic nephropathy, unspecified whether long term insulin use (HCC) -Most recent A1c of 6.6, insulin dose decreased by endocrinology due to some hypoglycemic episodes.   Patient Instructions  -Nice seeing you today!!  -Reschedule your physical. Come ins fasting that day.     Lelon Frohlich, MD [facility]

## 2020-07-02 NOTE — Patient Instructions (Signed)
-  Nice seeing you today!!  -Reschedule your physical. Come ins fasting that day.

## 2020-07-04 ENCOUNTER — Ambulatory Visit: Payer: BC Managed Care – PPO | Admitting: Endocrinology

## 2020-07-21 ENCOUNTER — Other Ambulatory Visit: Payer: Self-pay | Admitting: Endocrinology

## 2020-07-21 DIAGNOSIS — E119 Type 2 diabetes mellitus without complications: Secondary | ICD-10-CM

## 2020-07-29 ENCOUNTER — Telehealth: Payer: Self-pay | Admitting: Endocrinology

## 2020-07-29 NOTE — Telephone Encounter (Signed)
Patient is currently without Health insurance and the RX for Victoza is unaffordable at this time. Patient requests to be called at ph# [phone] to let Patient know what affordable medication can be substituted for Victoza.

## 2020-07-30 NOTE — Telephone Encounter (Signed)
Pt stated--had it work with insurance which Victoza is covered .

## 2020-08-20 ENCOUNTER — Other Ambulatory Visit: Payer: Self-pay

## 2020-08-21 ENCOUNTER — Ambulatory Visit (INDEPENDENT_AMBULATORY_CARE_PROVIDER_SITE_OTHER): Payer: Self-pay | Admitting: Internal Medicine

## 2020-08-21 ENCOUNTER — Encounter: Payer: Self-pay | Admitting: Internal Medicine

## 2020-08-21 VITALS — BP 102/72 | HR 77 | Temp 98.2°F | Ht 59.75 in | Wt 180.0 lb

## 2020-08-21 DIAGNOSIS — E7800 Pure hypercholesterolemia, unspecified: Secondary | ICD-10-CM

## 2020-08-21 DIAGNOSIS — E559 Vitamin D deficiency, unspecified: Secondary | ICD-10-CM

## 2020-08-21 DIAGNOSIS — J45901 Unspecified asthma with (acute) exacerbation: Secondary | ICD-10-CM

## 2020-08-21 DIAGNOSIS — E1121 Type 2 diabetes mellitus with diabetic nephropathy: Secondary | ICD-10-CM

## 2020-08-21 DIAGNOSIS — Z0000 Encounter for general adult medical examination without abnormal findings: Secondary | ICD-10-CM

## 2020-08-21 DIAGNOSIS — H47011 Ischemic optic neuropathy, right eye: Secondary | ICD-10-CM

## 2020-08-21 DIAGNOSIS — Z23 Encounter for immunization: Secondary | ICD-10-CM

## 2020-08-21 LAB — LIPID PANEL
Cholesterol: 189 mg/dL (ref 0–200)
HDL: 59.9 mg/dL (ref >39.00)
LDL Cholesterol: 91 mg/dL (ref 0–99)
NonHDL: 129.21
Total CHOL/HDL Ratio: 3
Triglycerides: 192 mg/dL — ABNORMAL HIGH (ref 0.0–149.0)
VLDL: 38.4 mg/dL (ref 0.0–40.0)

## 2020-08-21 LAB — CBC WITH DIFFERENTIAL/PLATELET
Basophils Absolute: 0.1 10*3/uL (ref 0.0–0.1)
Basophils Relative: 1.2 % (ref 0.0–3.0)
Eosinophils Absolute: 0.3 10*3/uL (ref 0.0–0.7)
Eosinophils Relative: 3.2 % (ref 0.0–5.0)
HCT: 42.6 % (ref 36.0–46.0)
Hemoglobin: 14.1 g/dL (ref 12.0–15.0)
Lymphocytes Relative: 33.1 % (ref 12.0–46.0)
Lymphs Abs: 2.8 10*3/uL (ref 0.7–4.0)
MCHC: 33.2 g/dL (ref 30.0–36.0)
MCV: 85.4 fl (ref 78.0–100.0)
Monocytes Absolute: 0.6 10*3/uL (ref 0.1–1.0)
Monocytes Relative: 7.2 % (ref 3.0–12.0)
Neutro Abs: 4.6 10*3/uL (ref 1.4–7.7)
Neutrophils Relative %: 55.3 % (ref 43.0–77.0)
Platelets: 434 10*3/uL — ABNORMAL HIGH (ref 150.0–400.0)
RBC: 4.99 Mil/uL (ref 3.87–5.11)
RDW: 14.1 % (ref 11.5–15.5)
WBC: 8.3 10*3/uL (ref 4.0–10.5)

## 2020-08-21 LAB — COMPREHENSIVE METABOLIC PANEL
ALT: 24 U/L (ref 0–35)
AST: 14 U/L (ref 0–37)
Albumin: 4 g/dL (ref 3.5–5.2)
Alkaline Phosphatase: 81 U/L (ref 39–117)
BUN: 15 mg/dL (ref 6–23)
CO2: 29 mEq/L (ref 19–32)
Calcium: 9.7 mg/dL (ref 8.4–10.5)
Chloride: 105 mEq/L (ref 96–112)
Creatinine, Ser: 0.73 mg/dL (ref 0.40–1.20)
GFR: 89.13 mL/min (ref >60.00)
Glucose, Bld: 101 mg/dL — ABNORMAL HIGH (ref 70–99)
Potassium: 4 mEq/L (ref 3.5–5.1)
Sodium: 142 mEq/L (ref 135–145)
Total Bilirubin: 0.7 mg/dL (ref 0.2–1.2)
Total Protein: 7.7 g/dL (ref 6.0–8.3)

## 2020-08-21 LAB — TSH: TSH: 1.22 u[IU]/mL (ref 0.35–4.50)

## 2020-08-21 LAB — MICROALBUMIN / CREATININE URINE RATIO
Creatinine,U: 82.8 mg/dL
Microalb Creat Ratio: 0.8 mg/g (ref 0.0–30.0)
Microalb, Ur: <0.7 mg/dL (ref 0.0–1.9)

## 2020-08-21 LAB — VITAMIN D 25 HYDROXY (VIT D DEFICIENCY, FRACTURES): VITD: 16.89 ng/mL — ABNORMAL LOW (ref 30.00–100.00)

## 2020-08-21 LAB — VITAMIN B12: Vitamin B-12: 429 pg/mL (ref 211–911)

## 2020-08-21 NOTE — Addendum Note (Signed)
Addended by: Elmer Picker on: 08/21/2020 07:37 AM   Modules accepted: Orders

## 2020-08-21 NOTE — Progress Notes (Signed)
Established Patient Office Visit     This visit occurred during the SARS-CoV-2 public health emergency.  Safety protocols were in place, including screening questions prior to the visit, additional usage of staff PPE, and extensive cleaning of exam room while observing appropriate contact time as indicated for disinfecting solutions.    CC/Reason for Visit: Annual preventive exam  HPI: Renee Lam is a 60 y.o. female who is coming in today for the above mentioned reasons. Past Medical History is significant for: Diabetes, hyperlipidemia, vitamin D deficiency, morbid obesity, asthma.  She has been trying to lose some weight she is down 2 pounds.  She had a colonoscopy in 2019, her last mammogram was in May 2021 these are done through her OB/GYN.  She is due for a pneumonia vaccine otherwise immunizations are up-to-date.  She follows yearly with her eye doctor.  She has a history of right ischemic optic neuropathy and has damaged peripheral vision due to the.  She has otherwise been feeling well and has no acute complaints.   Past Medical/Surgical History: Past Medical History:  Diagnosis Date  . Allergy   . Arthritis   . Asthma   . Chicken pox   . Diabetes mellitus   . Finger infection 2008   isolation  . GERD (gastroesophageal reflux disease)   . Heart murmur    benign per patient  . Hyperlipidemia   . Hypertension    past hx   . Ischemic optic neuropathy    right eye  . Migraine   . OSA (obstructive sleep apnea) 10/25/2016  . Osteoporosis   . Pericarditis   . Right sided sciatica 06/13/2017  . Sleep apnea    Patient is scheduled for sleep study tonight- wears cpap  . Viral meningitis     Past Surgical History:  Procedure Laterality Date  . CARDIAC CATHETERIZATION  2012  . CESAREAN SECTION     1997, 2000  . COLONOSCOPY    . KNEE ARTHROSCOPY Right 2010  . MENISCUS REPAIR Left 10/2017  . ORIF HUMERUS FRACTURE Right 03/25/2015   Procedure: OPEN REDUCTION  INTERNAL FIXATION (ORIF) DISTAL HUMERUS FRACTURE;  Surgeon: Renette Butters, MD;  Location: [place];  Service: Orthopedics;  Laterality: Right;  . POLYPECTOMY      Social History:  reports that she has never smoked. She has never used smokeless tobacco. She reports current alcohol use. She reports that she does not use drugs.  Allergies: Allergies  Allergen Reactions  . Metformin And Related Nausea And Vomiting  . Food     Strawberries, mangoes, pineapple, eggplant, zucchini, macadamia nuts   . Penicillins Hives       . Zithromax [Azithromycin] Hives    z-max; states can take Z pack    Family History:  Family History  Problem Relation Age of Onset  . Colon polyps Mother   . Diabetes Mother   . Heart failure Mother   . Peripheral vascular disease Mother   . Diabetes Father   . Heart disease Father   . Arthritis Father   . Hypertension Father   . Heart failure Father   . Prostate cancer Father   . Arthritis Other   . Hyperlipidemia Other   . Heart disease Other   . Hypertension Other   . Depression Other   . Ovarian cancer Other   . Uterine cancer Other   . Breast cancer Other   . Prostate cancer Other   . Breast  cancer Cousin   . Colon cancer Neg Hx   . Stomach cancer Neg Hx   . Rectal cancer Neg Hx   . Pancreatic cancer Neg Hx   . Esophageal cancer Neg Hx      Current Outpatient Medications:  .  albuterol (PROAIR HFA) 108 (90 Base) MCG/ACT inhaler, Inhale 2 puffs into the lungs as needed., Disp: 18 g, Rfl: 3 .  Aspirin-Acetaminophen-Caffeine (EXCEDRIN EXTRA STRENGTH PO), Take by mouth as needed., Disp: , Rfl:  .  atorvastatin (LIPITOR) 80 MG tablet, TAKE 1 TABLET BY MOUTH EVERYDAY AT BEDTIME, Disp: 90 tablet, Rfl: 4 .  BD PEN NEEDLE NANO 2ND GEN 32G X 4 MM MISC, USE AS DIRECTED 4 TIMES A DAY, Disp: 100 each, Rfl: 2 .  Continuous Blood Gluc Receiver (DEXCOM G6 RECEIVER) DEVI, 1 EACH BY DOES NOT APPLY ROUTE SEE ADMIN INSTRUCTIONS. FOR CONTINUOUS  GLUCOSE MONITORING E11.9, Disp: 1 each, Rfl: 0 .  Continuous Blood Gluc Sensor (DEXCOM G6 SENSOR) MISC, USE TO MONITOR BLOOD SUGAR AS DIRECTED, CHANGE SENSOR EVERY 10 DAYS, Disp: 3 each, Rfl: 3 .  Continuous Blood Gluc Transmit (DEXCOM G6 TRANSMITTER) MISC, 1 EACH BY DOES NOT APPLY ROUTE SEE ADMIN INSTRUCTIONS. FOR CONTINUOUS GLUCOSE MONITORING E11.9, Disp: 1 each, Rfl: 2 .  dapagliflozin propanediol (FARXIGA) 10 MG TABS tablet, Take 1 tablet (10 mg total) by mouth daily., Disp: 90 tablet, Rfl: 3 .  diphenhydrAMINE (BENADRYL) 25 MG tablet, Take 25 mg by mouth every 6 (six) hours as needed for allergies. , Disp: , Rfl:  .  EPINEPHrine 0.3 mg/0.3 mL IJ SOAJ injection, INJECT 0.3 ML INTO THE MUSCLE ONCE, Disp: 2 mL, Rfl: 0 .  Fluticasone-Salmeterol (ADVAIR DISKUS) 250-50 MCG/DOSE AEPB, Inhale 1 puff into the lungs 2 (two) times daily., Disp: 60 each, Rfl: 5 .  hydrocortisone 2.5 % cream, Apply topically every evening. (Patient taking differently: Apply topically as needed.), Disp: 30 g, Rfl: 1 .  insulin degludec (TRESIBA FLEXTOUCH) 200 UNIT/ML FlexTouch Pen, Inject 190 Units into the skin daily., Disp: 66 mL, Rfl: 3 .  liraglutide (VICTOZA) 18 MG/3ML SOPN, INJECT 1.8 MG UNDER THE SKIN ONCE DAILY, Disp: 9 mL, Rfl: 8 .  montelukast (SINGULAIR) 10 MG tablet, TAKE 1 TABLET BY MOUTH EVERYDAY AT BEDTIME, Disp: 90 tablet, Rfl: 2  Review of Systems:  Constitutional: Denies fever, chills, diaphoresis, appetite change and fatigue.  HEENT: Denies photophobia, eye pain, redness, hearing loss, ear pain, congestion, sore throat, rhinorrhea, sneezing, mouth sores, trouble swallowing, neck pain, neck stiffness and tinnitus.   Respiratory: Denies SOB, DOE, cough, chest tightness,  and wheezing.   Cardiovascular: Denies chest pain, palpitations and leg swelling.  Gastrointestinal: Denies nausea, vomiting, abdominal pain, diarrhea, constipation, blood in stool and abdominal distention.  Genitourinary: Denies dysuria,  urgency, frequency, hematuria, flank pain and difficulty urinating.  Endocrine: Denies: hot or cold intolerance, sweats, changes in hair or nails, polyuria, polydipsia. Musculoskeletal: Denies myalgias, back pain, joint swelling, arthralgias and gait problem.  Skin: Denies pallor, rash and wound.  Neurological: Denies dizziness, seizures, syncope, weakness, light-headedness, numbness and headaches.  Hematological: Denies adenopathy. Easy bruising, personal or family bleeding history  Psychiatric/Behavioral: Denies suicidal ideation, mood changes, confusion, nervousness, sleep disturbance and agitation    Physical Exam: Vitals:   08/21/20 0709  BP: 102/72  Pulse: 77  Temp: 98.2 F (36.8 C)  TempSrc: Oral  Weight: 180 lb (81.6 kg)  Height: 4' 11.75" (1.518 m)    Body mass index is 35.45 kg/m.  Constitutional: NAD, calm, comfortable, obese Eyes: PERRL, lids and conjunctivae normal ENMT: Mucous membranes are moist. Posterior pharynx clear of any exudate or lesions. Normal dentition. Tympanic membrane is pearly white, no erythema or bulging. Neck: normal, supple, no masses, no thyromegaly Respiratory: clear to auscultation bilaterally, no wheezing, no crackles. Normal respiratory effort. No accessory muscle use.  Cardiovascular: Regular rate and rhythm, no murmurs / rubs / gallops. No extremity edema. 2+ pedal pulses. Abdomen: no tenderness, no masses palpated. No hepatosplenomegaly. Bowel sounds positive.  Musculoskeletal: no clubbing / cyanosis. No joint deformity upper and lower extremities. Good ROM, no contractures. Normal muscle tone.  Skin: no rashes, lesions, ulcers. No induration Neurologic: CN 2-12 grossly intact. Sensation intact, DTR normal. Strength 5/5 in all 4.  Psychiatric: Normal judgment and insight. Alert and oriented x 3. Normal mood.    Impression and Plan:  Encounter for preventive health examination  -She has routine eye and dental care. -PPSV23 today,  otherwise immunizations are up-to-date. -Screening labs today. -Healthy lifestyle discussed in detail. -She has mammograms in the spring over a year, last was in May 2021 was negative. -She follows with her GYN for her Pap smears. -She had a colonoscopy in 2019.  Vitamin D deficiency  - Plan: VITAMIN D 25 Hydroxy (Vit-D Deficiency, Fractures)  Morbid obesity (HCC) -Discussed healthy lifestyle, including increased physical activity and better food choices to promote weight loss.  Hypercholesterolemia  - Plan: Lipid panel -Goal LDL is less than 70, she is not currently on a statin.  Controlled type 2 diabetes mellitus with diabetic nephropathy, unspecified whether long term insulin use ([place])  - Plan: CBC with Differential/Platelet, Comprehensive metabolic panel, Microalbumin / creatinine urine ratio -Followed by Dr. Loanne Drilling, most recent A1c was 6.6 in January.  Ischemic optic neuropathy of right eye  Asthma with acute exacerbation, unspecified asthma severity, unspecified whether persistent -Stable on current medications.  Need for vaccination against Streptococcus pneumoniae -PPSV23 administered today.    Patient Instructions   -Nice seeing you today!!  -Lab work today; will notify you once results are available.  -Pneumonia vaccine today.  -Schedule follow up in 6 months.   Preventive Care 39-37 Years Old, Female Preventive care refers to lifestyle choices and visits with your health care provider that can promote health and wellness. This includes:  A yearly physical exam. This is also called an annual wellness visit.  Regular dental and eye exams.  Immunizations.  Screening for certain conditions.  Healthy lifestyle choices, such as: ? Eating a healthy diet. ? Getting regular exercise. ? Not using drugs or products that contain nicotine and tobacco. ? Limiting alcohol use. What can I expect for my preventive care visit? Physical exam Your health care  provider will check your:  Height and weight. These may be used to calculate your BMI (body mass index). BMI is a measurement that tells if you are at a healthy weight.  Heart rate and blood pressure.  Body temperature.  Skin for abnormal spots. Counseling Your health care provider may ask you questions about your:  Past medical problems.  Family's medical history.  Alcohol, tobacco, and drug use.  Emotional well-being.  Home life and relationship well-being.  Sexual activity.  Diet, exercise, and sleep habits.  Work and work Statistician.  Access to firearms.  Method of birth control.  Menstrual cycle.  Pregnancy history. What immunizations do I need? Vaccines are usually given at various ages, according to a schedule. Your health care provider will recommend vaccines for  you based on your age, medical history, and lifestyle or other factors, such as travel or where you work.   What tests do I need? Blood tests  Lipid and cholesterol levels. These may be checked every 5 years, or more often if you are over 83 years old.  Hepatitis C test.  Hepatitis B test. Screening  Lung cancer screening. You may have this screening every year starting at age 47 if you have a 30-pack-year history of smoking and currently smoke or have quit within the past 15 years.  Colorectal cancer screening. ? All adults should have this screening starting at age 5 and continuing until age 72. ? Your health care provider may recommend screening at age 83 if you are at increased risk. ? You will have tests every 1-10 years, depending on your results and the type of screening test.  Diabetes screening. ? This is done by checking your blood sugar (glucose) after you have not eaten for a while (fasting). ? You may have this done every 1-3 years.  Mammogram. ? This may be done every 1-2 years. ? Talk with your health care provider about when you should start having regular mammograms. This  may depend on whether you have a family history of breast cancer.  BRCA-related cancer screening. This may be done if you have a family history of breast, ovarian, tubal, or peritoneal cancers.  Pelvic exam and Pap test. ? This may be done every 3 years starting at age 91. ? Starting at age 21, this may be done every 5 years if you have a Pap test in combination with an HPV test. Other tests  STD (sexually transmitted disease) testing, if you are at risk.  Bone density scan. This is done to screen for osteoporosis. You may have this scan if you are at high risk for osteoporosis. Talk with your health care provider about your test results, treatment options, and if necessary, the need for more tests. Follow these instructions at home: Eating and drinking  Eat a diet that includes fresh fruits and vegetables, whole grains, lean protein, and low-fat dairy products.  Take vitamin and mineral supplements as recommended by your health care provider.  Do not drink alcohol if: ? Your health care provider tells you not to drink. ? You are pregnant, may be pregnant, or are planning to become pregnant.  If you drink alcohol: ? Limit how much you have to 0-1 drink a day. ? Be aware of how much alcohol is in your drink. In the [place] one drink equals one 12 oz bottle of beer (355 mL), one 5 oz glass of wine (148 mL), or one 1 oz glass of hard liquor (44 mL).   Lifestyle  Take daily care of your teeth and gums. Brush your teeth every morning and night with fluoride toothpaste. Floss one time each day.  Stay active. Exercise for at least 30 minutes 5 or more days each week.  Do not use any products that contain nicotine or tobacco, such as cigarettes, e-cigarettes, and chewing tobacco. If you need help quitting, ask your health care provider.  Do not use drugs.  If you are sexually active, practice safe sex. Use a condom or other form of protection to prevent STIs (sexually transmitted  infections).  If you do not wish to become pregnant, use a form of birth control. If you plan to become pregnant, see your health care provider for a prepregnancy visit.  If told by your health  care provider, take low-dose aspirin daily starting at age 29.  Find healthy ways to cope with stress, such as: ? Meditation, yoga, or listening to music. ? Journaling. ? Talking to a trusted person. ? Spending time with friends and family. Safety  Always wear your seat belt while driving or riding in a vehicle.  Do not drive: ? If you have been drinking alcohol. Do not ride with someone who has been drinking. ? When you are tired or distracted. ? While texting.  Wear a helmet and other protective equipment during sports activities.  If you have firearms in your house, make sure you follow all gun safety procedures. What's next?  Visit your health care provider once a year for an annual wellness visit.  Ask your health care provider how often you should have your eyes and teeth checked.  Stay up to date on all vaccines. This information is not intended to replace advice given to you by your health care provider. Make sure you discuss any questions you have with your health care provider. Document Revised: 03/04/2020 Document Reviewed: 02/09/2018 Elsevier Patient Education  2021 [place], MD [facility]

## 2020-08-21 NOTE — Addendum Note (Signed)
Addended by: Agnes Lawrence on: 08/21/2020 08:02 AM   Modules accepted: Orders

## 2020-08-21 NOTE — Patient Instructions (Signed)
-Nice seeing you today!!  -Lab work today; will notify you once results are available.  -Pneumonia vaccine today.  -Schedule follow up in 6 months.   Preventive Care 17-61 Years Old, Female Preventive care refers to lifestyle choices and visits with your health care provider that can promote health and wellness. This includes:  A yearly physical exam. This is also called an annual wellness visit.  Regular dental and eye exams.  Immunizations.  Screening for certain conditions.  Healthy lifestyle choices, such as: ? Eating a healthy diet. ? Getting regular exercise. ? Not using drugs or products that contain nicotine and tobacco. ? Limiting alcohol use. What can I expect for my preventive care visit? Physical exam Your health care provider will check your:  Height and weight. These may be used to calculate your BMI (body mass index). BMI is a measurement that tells if you are at a healthy weight.  Heart rate and blood pressure.  Body temperature.  Skin for abnormal spots. Counseling Your health care provider may ask you questions about your:  Past medical problems.  Family's medical history.  Alcohol, tobacco, and drug use.  Emotional well-being.  Home life and relationship well-being.  Sexual activity.  Diet, exercise, and sleep habits.  Work and work Statistician.  Access to firearms.  Method of birth control.  Menstrual cycle.  Pregnancy history. What immunizations do I need? Vaccines are usually given at various ages, according to a schedule. Your health care provider will recommend vaccines for you based on your age, medical history, and lifestyle or other factors, such as travel or where you work.   What tests do I need? Blood tests  Lipid and cholesterol levels. These may be checked every 5 years, or more often if you are over 79 years old.  Hepatitis C test.  Hepatitis B test. Screening  Lung cancer screening. You may have this screening  every year starting at age 71 if you have a 30-pack-year history of smoking and currently smoke or have quit within the past 15 years.  Colorectal cancer screening. ? All adults should have this screening starting at age 5 and continuing until age 30. ? Your health care provider may recommend screening at age 59 if you are at increased risk. ? You will have tests every 1-10 years, depending on your results and the type of screening test.  Diabetes screening. ? This is done by checking your blood sugar (glucose) after you have not eaten for a while (fasting). ? You may have this done every 1-3 years.  Mammogram. ? This may be done every 1-2 years. ? Talk with your health care provider about when you should start having regular mammograms. This may depend on whether you have a family history of breast cancer.  BRCA-related cancer screening. This may be done if you have a family history of breast, ovarian, tubal, or peritoneal cancers.  Pelvic exam and Pap test. ? This may be done every 3 years starting at age 27. ? Starting at age 76, this may be done every 5 years if you have a Pap test in combination with an HPV test. Other tests  STD (sexually transmitted disease) testing, if you are at risk.  Bone density scan. This is done to screen for osteoporosis. You may have this scan if you are at high risk for osteoporosis. Talk with your health care provider about your test results, treatment options, and if necessary, the need for more tests. Follow these instructions at  home: Eating and drinking  Eat a diet that includes fresh fruits and vegetables, whole grains, lean protein, and low-fat dairy products.  Take vitamin and mineral supplements as recommended by your health care provider.  Do not drink alcohol if: ? Your health care provider tells you not to drink. ? You are pregnant, may be pregnant, or are planning to become pregnant.  If you drink alcohol: ? Limit how much you have  to 0-1 drink a day. ? Be aware of how much alcohol is in your drink. In the [place] one drink equals one 12 oz bottle of beer (355 mL), one 5 oz glass of wine (148 mL), or one 1 oz glass of hard liquor (44 mL).   Lifestyle  Take daily care of your teeth and gums. Brush your teeth every morning and night with fluoride toothpaste. Floss one time each day.  Stay active. Exercise for at least 30 minutes 5 or more days each week.  Do not use any products that contain nicotine or tobacco, such as cigarettes, e-cigarettes, and chewing tobacco. If you need help quitting, ask your health care provider.  Do not use drugs.  If you are sexually active, practice safe sex. Use a condom or other form of protection to prevent STIs (sexually transmitted infections).  If you do not wish to become pregnant, use a form of birth control. If you plan to become pregnant, see your health care provider for a prepregnancy visit.  If told by your health care provider, take low-dose aspirin daily starting at age 60.  Find healthy ways to cope with stress, such as: ? Meditation, yoga, or listening to music. ? Journaling. ? Talking to a trusted person. ? Spending time with friends and family. Safety  Always wear your seat belt while driving or riding in a vehicle.  Do not drive: ? If you have been drinking alcohol. Do not ride with someone who has been drinking. ? When you are tired or distracted. ? While texting.  Wear a helmet and other protective equipment during sports activities.  If you have firearms in your house, make sure you follow all gun safety procedures. What's next?  Visit your health care provider once a year for an annual wellness visit.  Ask your health care provider how often you should have your eyes and teeth checked.  Stay up to date on all vaccines. This information is not intended to replace advice given to you by your health care provider. Make sure you discuss any questions you  have with your health care provider. Document Revised: 03/04/2020 Document Reviewed: 02/09/2018 Elsevier Patient Education  2021 [employer].

## 2020-08-22 ENCOUNTER — Other Ambulatory Visit: Payer: Self-pay | Admitting: Internal Medicine

## 2020-08-22 DIAGNOSIS — E559 Vitamin D deficiency, unspecified: Secondary | ICD-10-CM

## 2020-08-22 DIAGNOSIS — E7800 Pure hypercholesterolemia, unspecified: Secondary | ICD-10-CM

## 2020-08-22 MED ORDER — VITAMIN D (ERGOCALCIFEROL) 1.25 MG (50000 UNIT) PO CAPS: 50000.0000 [IU] | ORAL_CAPSULE | ORAL | 0 refills | Status: AC

## 2020-08-22 MED ORDER — EZETIMIBE 10 MG PO TABS: 10.0000 mg | ORAL_TABLET | Freq: Every day | ORAL | 1 refills | Status: DC

## 2020-08-27 ENCOUNTER — Telehealth: Payer: Self-pay | Admitting: Internal Medicine

## 2020-08-27 ENCOUNTER — Other Ambulatory Visit: Payer: Self-pay | Admitting: Internal Medicine

## 2020-08-27 DIAGNOSIS — E7800 Pure hypercholesterolemia, unspecified: Secondary | ICD-10-CM

## 2020-08-27 DIAGNOSIS — E559 Vitamin D deficiency, unspecified: Secondary | ICD-10-CM

## 2020-08-27 NOTE — Telephone Encounter (Signed)
Patient is aware of lab results.

## 2020-08-27 NOTE — Telephone Encounter (Signed)
Patient called for lab results.

## 2020-09-03 ENCOUNTER — Telehealth: Payer: Self-pay

## 2020-09-03 DIAGNOSIS — Z124 Encounter for screening for malignant neoplasm of cervix: Secondary | ICD-10-CM | POA: Diagnosis not present

## 2020-09-03 DIAGNOSIS — Z01419 Encounter for gynecological examination (general) (routine) without abnormal findings: Secondary | ICD-10-CM | POA: Diagnosis not present

## 2020-09-03 NOTE — Telephone Encounter (Addendum)
PA for Victoza has been approved from 3/3/200-08/14/2021  PA for Dexcom sensor has been approved 3/3/222-08/14/2021

## 2020-09-24 ENCOUNTER — Ambulatory Visit: Payer: BC Managed Care – PPO | Admitting: Endocrinology

## 2020-10-15 ENCOUNTER — Encounter: Payer: Self-pay | Admitting: Family Medicine

## 2020-10-15 ENCOUNTER — Other Ambulatory Visit: Payer: Self-pay

## 2020-10-15 ENCOUNTER — Ambulatory Visit: Payer: BC Managed Care – PPO | Admitting: Family Medicine

## 2020-10-15 ENCOUNTER — Ambulatory Visit: Payer: BC Managed Care – PPO | Admitting: Endocrinology

## 2020-10-15 VITALS — BP 110/70 | HR 84 | Temp 98.0°F | Wt 180.9 lb

## 2020-10-15 VITALS — BP 114/60 | HR 86 | Ht 59.0 in | Wt 180.8 lb

## 2020-10-15 DIAGNOSIS — H8111 Benign paroxysmal vertigo, right ear: Secondary | ICD-10-CM

## 2020-10-15 DIAGNOSIS — E119 Type 2 diabetes mellitus without complications: Secondary | ICD-10-CM | POA: Diagnosis not present

## 2020-10-15 LAB — POCT GLYCOSYLATED HEMOGLOBIN (HGB A1C): Hemoglobin A1C: 7.6 % — AB (ref 4.0–5.6)

## 2020-10-15 MED ORDER — TRESIBA FLEXTOUCH 200 UNIT/ML ~~LOC~~ SOPN: 195.0000 [IU] | PEN_INJECTOR | Freq: Every day | SUBCUTANEOUS | 3 refills | Status: DC

## 2020-10-15 NOTE — Progress Notes (Signed)
Established Patient Office Visit  Subjective:  Patient ID: Renee Lam, female    DOB: [birth year]  Age: 60 y.o. MRN: [id number]  CC:  Chief Complaint  Patient presents with  . Dizziness    HPI Renee Lam presents for work in visit for dizziness.  This is actually not a new symptom but has been going on at least couple months.  She initially describes some lightheadedness when sitting up intermittently, somewhat infrequently, but she states she also has dizziness frequently when she turns around suddenly when standing.  She did initially describe some lightheadedness but this sounds like more vestibular.  Occasionally associated nausea but no vomiting.  No hearing changes.  No tinnitus.  No focal weakness.  No speech changes.  No ataxia.  She does have diabetes but denies any hypoglycemic symptoms.  This does not seem to correlate with any low blood sugars.  She has no history of hypertension.  Generally stays well-hydrated.  She had multiple labs done with recent physical and no acute abnormalities.  Past Medical History:  Diagnosis Date  . Allergy   . Arthritis   . Asthma   . Chicken pox   . Diabetes mellitus   . Finger infection 2008   isolation  . GERD (gastroesophageal reflux disease)   . Heart murmur    benign per patient  . Hyperlipidemia   . Hypertension    past hx   . Ischemic optic neuropathy    right eye  . Migraine   . OSA (obstructive sleep apnea) 10/25/2016  . Osteoporosis   . Pericarditis   . Right sided sciatica 06/13/2017  . Sleep apnea    Patient is scheduled for sleep study tonight- wears cpap  . Viral meningitis     Past Surgical History:  Procedure Laterality Date  . CARDIAC CATHETERIZATION  2012  . CESAREAN SECTION     1997, 2000  . COLONOSCOPY    . KNEE ARTHROSCOPY Right 2010  . MENISCUS REPAIR Left 10/2017  . ORIF HUMERUS FRACTURE Right 03/25/2015   Procedure: OPEN REDUCTION INTERNAL FIXATION (ORIF) DISTAL HUMERUS FRACTURE;   Surgeon: Renette Butters, MD;  Location: [facility];  Service: Orthopedics;  Laterality: Right;  . POLYPECTOMY      Family History  Problem Relation Age of Onset  . Colon polyps Mother   . Diabetes Mother   . Heart failure Mother   . Peripheral vascular disease Mother   . Diabetes Father   . Heart disease Father   . Arthritis Father   . Hypertension Father   . Heart failure Father   . Prostate cancer Father   . Arthritis Other   . Hyperlipidemia Other   . Heart disease Other   . Hypertension Other   . Depression Other   . Ovarian cancer Other   . Uterine cancer Other   . Breast cancer Other   . Prostate cancer Other   . Breast cancer Cousin   . Colon cancer Neg Hx   . Stomach cancer Neg Hx   . Rectal cancer Neg Hx   . Pancreatic cancer Neg Hx   . Esophageal cancer Neg Hx     Social History   Socioeconomic History  . Marital status: Divorced    Spouse name: Not on file  . Number of children: Not on file  . Years of education: Not on file  . Highest education level: Not on file  Occupational History  . Not on file  Tobacco Use  . Smoking status: Never Smoker  . Smokeless tobacco: Never Used  Vaping Use  . Vaping Use: Never used  Substance and Sexual Activity  . Alcohol use: Yes    Comment: once a year  . Drug use: No  . Sexual activity: Yes    Birth control/protection: Post-menopausal  Other Topics Concern  . Not on file  Social History Narrative  . Not on file   Social Determinants of Health   Financial Resource Strain: Not on file  Food Insecurity: Not on file  Transportation Needs: Not on file  Physical Activity: Not on file  Stress: Not on file  Social Connections: Not on file  Intimate Partner Violence: Not on file    Outpatient Medications Prior to Visit  Medication Sig Dispense Refill  . albuterol (PROAIR HFA) 108 (90 Base) MCG/ACT inhaler Inhale 2 puffs into the lungs as needed. 18 g 3  . Aspirin-Acetaminophen-Caffeine  (EXCEDRIN EXTRA STRENGTH PO) Take by mouth as needed.    Marland Kitchen atorvastatin (LIPITOR) 80 MG tablet TAKE 1 TABLET BY MOUTH EVERYDAY AT BEDTIME 90 tablet 4  . BD PEN NEEDLE NANO 2ND GEN 32G X 4 MM MISC USE AS DIRECTED 4 TIMES A DAY 100 each 2  . Continuous Blood Gluc Receiver (DEXCOM G6 RECEIVER) DEVI 1 EACH BY DOES NOT APPLY ROUTE SEE ADMIN INSTRUCTIONS. FOR CONTINUOUS GLUCOSE MONITORING E11.9 1 each 0  . Continuous Blood Gluc Sensor (DEXCOM G6 SENSOR) MISC USE TO MONITOR BLOOD SUGAR AS DIRECTED, CHANGE SENSOR EVERY 10 DAYS 3 each 3  . Continuous Blood Gluc Transmit (DEXCOM G6 TRANSMITTER) MISC 1 EACH BY DOES NOT APPLY ROUTE SEE ADMIN INSTRUCTIONS. FOR CONTINUOUS GLUCOSE MONITORING E11.9 1 each 2  . dapagliflozin propanediol (FARXIGA) 10 MG TABS tablet Take 1 tablet (10 mg total) by mouth daily. 90 tablet 3  . diphenhydrAMINE (BENADRYL) 25 MG tablet Take 25 mg by mouth every 6 (six) hours as needed for allergies.     Marland Kitchen EPINEPHrine 0.3 mg/0.3 mL IJ SOAJ injection INJECT 0.3 ML INTO THE MUSCLE ONCE 2 mL 0  . ezetimibe (ZETIA) 10 MG tablet Take 1 tablet (10 mg total) by mouth daily. 90 tablet 1  . Fluticasone-Salmeterol (ADVAIR DISKUS) 250-50 MCG/DOSE AEPB Inhale 1 puff into the lungs 2 (two) times daily. 60 each 5  . hydrocortisone 2.5 % cream Apply topically every evening. (Patient taking differently: Apply topically as needed.) 30 g 1  . insulin degludec (TRESIBA FLEXTOUCH) 200 UNIT/ML FlexTouch Pen Inject 190 Units into the skin daily. 66 mL 3  . liraglutide (VICTOZA) 18 MG/3ML SOPN INJECT 1.8 MG UNDER THE SKIN ONCE DAILY 9 mL 8  . montelukast (SINGULAIR) 10 MG tablet TAKE 1 TABLET BY MOUTH EVERYDAY AT BEDTIME 90 tablet 2  . Vitamin D, Ergocalciferol, (DRISDOL) 1.25 MG (50000 UNIT) CAPS capsule Take 1 capsule (50,000 Units total) by mouth every 7 (seven) days for 12 doses. 12 capsule 0   No facility-administered medications prior to visit.      ROS Review of Systems  Constitutional: Negative for  chills and fever.  Eyes: Negative for visual disturbance.  Respiratory: Negative for cough and shortness of breath.   Cardiovascular: Negative for chest pain.  Neurological: Positive for dizziness. Negative for seizures, syncope, facial asymmetry, speech difficulty, weakness and headaches.      Objective:    Physical Exam Vitals reviewed.  Constitutional:      Appearance: Normal appearance.  Cardiovascular:     Rate and Rhythm: Normal  rate and regular rhythm.  Pulmonary:     Effort: Pulmonary effort is normal.     Breath sounds: Normal breath sounds.  Musculoskeletal:     Cervical back: Neck supple.  Lymphadenopathy:     Cervical: No cervical adenopathy.  Neurological:     General: No focal deficit present.     Mental Status: She is alert.     Cranial Nerves: No cranial nerve deficit.     Motor: No weakness.     Coordination: Coordination normal.     Gait: Gait normal.     Comments: No visible horizontal nystagmus.    She does have some reproducible vertigo when head turned 45 degrees to right and going seated to supine but not the left.       BP 110/70 (BP Location: Right Arm, Patient Position: Sitting, Cuff Size: Large)   Pulse 84   Temp 98 F (36.7 C) (Oral)   Wt 180 lb 14.4 oz (82.1 kg)   SpO2 97%   BMI 35.63 kg/m  Wt Readings from Last 3 Encounters:  10/15/20 180 lb 14.4 oz (82.1 kg)  08/21/20 180 lb (81.6 kg)  07/02/20 182 lb (82.6 kg)     There are no preventive care reminders to display for this patient.  There are no preventive care reminders to display for this patient.  Lab Results  Component Value Date   TSH 1.22 08/21/2020   Lab Results  Component Value Date   WBC 8.3 08/21/2020   HGB 14.1 08/21/2020   HCT 42.6 08/21/2020   MCV 85.4 08/21/2020   PLT 434.0 (H) 08/21/2020   Lab Results  Component Value Date   NA 142 08/21/2020   K 4.0 08/21/2020   CO2 29 08/21/2020   GLUCOSE 101 (H) 08/21/2020   BUN 15 08/21/2020   CREATININE 0.73  08/21/2020   BILITOT 0.7 08/21/2020   ALKPHOS 81 08/21/2020   AST 14 08/21/2020   ALT 24 08/21/2020   PROT 7.7 08/21/2020   ALBUMIN 4.0 08/21/2020   CALCIUM 9.7 08/21/2020   ANIONGAP 8 04/08/2016   GFR 89.13 08/21/2020   Lab Results  Component Value Date   CHOL 189 08/21/2020   Lab Results  Component Value Date   HDL 59.90 08/21/2020   Lab Results  Component Value Date   LDLCALC 91 08/21/2020   Lab Results  Component Value Date   TRIG 192.0 (H) 08/21/2020   Lab Results  Component Value Date   CHOLHDL 3 08/21/2020   Lab Results  Component Value Date   HGBA1C 6.6 (A) 06/25/2020      Assessment & Plan:   Dizziness.  No orthostatic change.  Standing blood pressure 122/68.  She does have some reproducible dizziness with head tilted 45 degrees to the right lying supine and we did not reproduce this on the left side.  Symptoms were relatively mild and fairly transient.  She denies any red flags such as ataxia, headache, focal weakness, etc.  Suspect benign peripheral positional vertigo to the right  -Reviewed Epley maneuvers -Recommend prompt follow-up with primary if not resolving over the next couple weeks.  No orders of the defined types were placed in this encounter.   Follow-up: No follow-ups on file.    Carolann Littler, MD

## 2020-10-15 NOTE — Progress Notes (Signed)
Subjective:    Patient ID: Renee Lam, female    DOB: 1960/08/05, 60 y.o.   MRN: [id number]  HPI Pt returns for f/u of diabetes mellitus: DM type: Insulin-requiring type 2.  Dx'ed: 5427 Complications: DR.  Therapy: insulin since 2010, Farxiga, and Victoza.  GDM: 1996 and 1999.  DKA: never.  Severe hypoglycemia: never.     Pancreatitis: never.   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   Other: she takes QD insulin, after poor results with multiple daily injections; she did not tolerate metformin (2013-2017--stopped due to GI upset); She says cbg on work days=days off.  She declines pump for now. Interval history:   She takes meds as rx'ed.  I reviewed continuous glucose monitor data. Glucose varies from 120-211.  There is no trend throughout the day.   She requests a new transmitter.  She was recently rx'ed for yeast vaginitis.   Past Medical History:  Diagnosis Date  . Allergy   . Arthritis   . Asthma   . Chicken pox   . Diabetes mellitus   . Finger infection 2008   isolation  . GERD (gastroesophageal reflux disease)   . Heart murmur    benign per patient  . Hyperlipidemia   . Hypertension    past hx   . Ischemic optic neuropathy    right eye  . Migraine   . OSA (obstructive sleep apnea) 10/25/2016  . Osteoporosis   . Pericarditis   . Right sided sciatica 06/13/2017  . Sleep apnea    Patient is scheduled for sleep study tonight- wears cpap  . Viral meningitis     Past Surgical History:  Procedure Laterality Date  . CARDIAC CATHETERIZATION  2012  . CESAREAN SECTION     1997, 2000  . COLONOSCOPY    . KNEE ARTHROSCOPY Right 2010  . MENISCUS REPAIR Left 10/2017  . ORIF HUMERUS FRACTURE Right 03/25/2015   Procedure: OPEN REDUCTION INTERNAL FIXATION (ORIF) DISTAL HUMERUS FRACTURE;  Surgeon: Renette Butters, MD;  Location: [facility];  Service: Orthopedics;  Laterality: Right;  . POLYPECTOMY      Social History   Socioeconomic History  .  Marital status: Divorced    Spouse name: Not on file  . Number of children: Not on file  . Years of education: Not on file  . Highest education level: Not on file  Occupational History  . Not on file  Tobacco Use  . Smoking status: Never Smoker  . Smokeless tobacco: Never Used  Vaping Use  . Vaping Use: Never used  Substance and Sexual Activity  . Alcohol use: Yes    Comment: once a year  . Drug use: No  . Sexual activity: Yes    Birth control/protection: Post-menopausal  Other Topics Concern  . Not on file  Social History Narrative  . Not on file   Social Determinants of Health   Financial Resource Strain: Not on file  Food Insecurity: Not on file  Transportation Needs: Not on file  Physical Activity: Not on file  Stress: Not on file  Social Connections: Not on file  Intimate Partner Violence: Not on file    Current Outpatient Medications on File Prior to Visit  Medication Sig Dispense Refill  . albuterol (PROAIR HFA) 108 (90 Base) MCG/ACT inhaler Inhale 2 puffs into the lungs as needed. 18 g 3  . Aspirin-Acetaminophen-Caffeine (EXCEDRIN EXTRA STRENGTH PO) Take by mouth as needed.    Marland Kitchen atorvastatin (LIPITOR)  80 MG tablet TAKE 1 TABLET BY MOUTH EVERYDAY AT BEDTIME 90 tablet 4  . BD PEN NEEDLE NANO 2ND GEN 32G X 4 MM MISC USE AS DIRECTED 4 TIMES A DAY 100 each 2  . Continuous Blood Gluc Receiver (DEXCOM G6 RECEIVER) DEVI 1 EACH BY DOES NOT APPLY ROUTE SEE ADMIN INSTRUCTIONS. FOR CONTINUOUS GLUCOSE MONITORING E11.9 1 each 0  . Continuous Blood Gluc Sensor (DEXCOM G6 SENSOR) MISC USE TO MONITOR BLOOD SUGAR AS DIRECTED, CHANGE SENSOR EVERY 10 DAYS 3 each 3  . Continuous Blood Gluc Transmit (DEXCOM G6 TRANSMITTER) MISC 1 EACH BY DOES NOT APPLY ROUTE SEE ADMIN INSTRUCTIONS. FOR CONTINUOUS GLUCOSE MONITORING E11.9 1 each 2  . dapagliflozin propanediol (FARXIGA) 10 MG TABS tablet Take 1 tablet (10 mg total) by mouth daily. 90 tablet 3  . diphenhydrAMINE (BENADRYL) 25 MG tablet  Take 25 mg by mouth every 6 (six) hours as needed for allergies.     Marland Kitchen EPINEPHrine 0.3 mg/0.3 mL IJ SOAJ injection INJECT 0.3 ML INTO THE MUSCLE ONCE 2 mL 0  . ezetimibe (ZETIA) 10 MG tablet Take 1 tablet (10 mg total) by mouth daily. 90 tablet 1  . Fluticasone-Salmeterol (ADVAIR DISKUS) 250-50 MCG/DOSE AEPB Inhale 1 puff into the lungs 2 (two) times daily. 60 each 5  . hydrocortisone 2.5 % cream Apply topically every evening. (Patient taking differently: Apply topically as needed.) 30 g 1  . liraglutide (VICTOZA) 18 MG/3ML SOPN INJECT 1.8 MG UNDER THE SKIN ONCE DAILY 9 mL 8  . montelukast (SINGULAIR) 10 MG tablet TAKE 1 TABLET BY MOUTH EVERYDAY AT BEDTIME 90 tablet 2  . Vitamin D, Ergocalciferol, (DRISDOL) 1.25 MG (50000 UNIT) CAPS capsule Take 1 capsule (50,000 Units total) by mouth every 7 (seven) days for 12 doses. 12 capsule 0   No current facility-administered medications on file prior to visit.     Family History  Problem Relation Age of Onset  . Colon polyps Mother   . Diabetes Mother   . Heart failure Mother   . Peripheral vascular disease Mother   . Diabetes Father   . Heart disease Father   . Arthritis Father   . Hypertension Father   . Heart failure Father   . Prostate cancer Father   . Arthritis Other   . Hyperlipidemia Other   . Heart disease Other   . Hypertension Other   . Depression Other   . Ovarian cancer Other   . Uterine cancer Other   . Breast cancer Other   . Prostate cancer Other   . Breast cancer Cousin   . Colon cancer Neg Hx   . Stomach cancer Neg Hx   . Rectal cancer Neg Hx   . Pancreatic cancer Neg Hx   . Esophageal cancer Neg Hx     BP 114/60 (BP Location: Right Arm, Patient Position: Sitting, Cuff Size: Large)   Pulse 86   Ht 4\' 11"  (1.499 m)   Wt 180 lb 12.8 oz (82 kg)   SpO2 96%   BMI 36.52 kg/m    Review of Systems She denies hypoglycemia.  She has right foot pain.     Objective:   Physical Exam VITAL SIGNS:  See vs  page GENERAL: no distress Pulses: dorsalis pedis intact bilat.   MSK: no deformity of the feet CV: trace bilat leg edema.   Skin:  no ulcer on the feet.  normal color and temp on the feet.  Neuro: sensation is intact to touch on  the feet.   Lab Results  Component Value Date   HGBA1C 7.6 (A) 10/15/2020        Assessment & Plan:  Insulin-requiring type 2 DM: uncontrolled Vaginits, due to farxiga.  We discussed.  She declines to reduce or d/c.   Foot pain, uncertain etiology.  Patient Instructions  Please increase the Tresiba to 195 units per day, on all days.  This is a very slow time-release insulin, so you can take it at any time of day.   Please continue the same other diabetes medications.   Please see a foot specialist.  you will receive a phone call, about a day and time for an appointment check your blood sugar once a day.  vary the time of day when you check, between before the 3 meals, and at bedtime.  also check if you have symptoms of your blood sugar being too high or too low.  please keep a record of the readings and bring it to your next appointment here (or you can bring the meter itself).  You can write it on any piece of paper.  please call us sooner if your blood sugar goes below 70, or if you have a lot of readings over 200.  Please come back for a follow-up appointment in 4 months.

## 2020-10-15 NOTE — Patient Instructions (Addendum)
Please increase the Tresiba to 195 units per day, on all days.  This is a very slow time-release insulin, so you can take it at any time of day.   Please continue the same other diabetes medications.   Please see a foot specialist.  you will receive a phone call, about a day and time for an appointment check your blood sugar once a day.  vary the time of day when you check, between before the 3 meals, and at bedtime.  also check if you have symptoms of your blood sugar being too high or too low.  please keep a record of the readings and bring it to your next appointment here (or you can bring the meter itself).  You can write it on any piece of paper.  please call us sooner if your blood sugar goes below 70, or if you have a lot of readings over 200.  Please come back for a follow-up appointment in 4 months.

## 2020-10-15 NOTE — Patient Instructions (Signed)
Benign Positional Vertigo Vertigo is the feeling that you or your surroundings are moving when they are not. Benign positional vertigo is the most common form of vertigo. This is usually a harmless condition (benign). This condition is positional. This means that symptoms are triggered by certain movements and positions. This condition can be dangerous if it occurs while you are doing something that could cause harm to you or others. This includes activities such as driving or operating machinery. What are the causes? The inner ear has fluid-filled canals that help your brain sense movement and balance. When the fluid moves, the brain receives messages about your body's position. With benign positional vertigo, crystals in the inner ear break free and disturb the inner ear area. This causes your brain to receive confusing messages about your body's position. What increases the risk? You are more likely to develop this condition if:  You are a woman.  You are 50 years of age or older.  You have recently had a head injury.  You have an inner ear disease. What are the signs or symptoms? Symptoms of this condition usually happen when you move your head or your eyes in different directions. Symptoms may start suddenly, and usually last for less than a minute. They include:  Loss of balance and falling.  Feeling like you are spinning or moving.  Feeling like your surroundings are spinning or moving.  Nausea and vomiting.  Blurred vision.  Dizziness.  Involuntary eye movement (nystagmus). Symptoms can be mild and cause only minor problems, or they can be severe and interfere with daily life. Episodes of benign positional vertigo may return (recur) over time. Symptoms may improve over time. How is this diagnosed? This condition may be diagnosed based on:  Your medical history.  Physical exam of the head, neck, and ears.  Positional tests to check for or stimulate vertigo. You may be  asked to turn your head and change positions, such as going from sitting to lying down. A health care provider will watch for symptoms of vertigo. You may be referred to a health care provider who specializes in ear, nose, and throat problems (ENT, or otolaryngologist) or a provider who specializes in disorders of the nervous system (neurologist). How is this treated? This condition may be treated in a session in which your health care provider moves your head in specific positions to help the displaced crystals in your inner ear move. Treatment for this condition may take several sessions. Surgery may be needed in severe cases, but this is rare. In some cases, benign positional vertigo may resolve on its own in 2-4 weeks.   Follow these instructions at home: Safety  Move slowly. Avoid sudden body or head movements or certain positions, as told by your health care provider.  Avoid driving until your health care provider says it is safe for you to do so.  Avoid operating heavy machinery until your health care provider says it is safe for you to do so.  Avoid doing any tasks that would be dangerous to you or others if vertigo occurs.  If you have trouble walking or keeping your balance, try using a cane for stability. If you feel dizzy or unstable, sit down right away.  Return to your normal activities as told by your health care provider. Ask your health care provider what activities are safe for you. General instructions  Take over-the-counter and prescription medicines only as told by your health care provider.  Drink enough fluid   to keep your urine pale yellow.  Keep all follow-up visits as told by your health care provider. This is important. Contact a health care provider if:  You have a fever.  Your condition gets worse or you develop new symptoms.  Your family or friends notice any behavioral changes.  You have nausea or vomiting that gets worse.  You have numbness or a  prickling and tingling sensation. Get help right away if you:  Have difficulty speaking or moving.  Are always dizzy.  Faint.  Develop severe headaches.  Have weakness in your legs or arms.  Have changes in your hearing or vision.  Develop a stiff neck.  Develop sensitivity to light. Summary  Vertigo is the feeling that you or your surroundings are moving when they are not. Benign positional vertigo is the most common form of vertigo.  This condition is caused by crystals in the inner ear that become displaced. This causes a disturbance in an area of the inner ear that helps your brain sense movement and balance.  Symptoms include loss of balance and falling, feeling that you or your surroundings are moving, nausea and vomiting, and blurred vision.  This condition can be diagnosed based on symptoms, a physical exam, and positional tests.  Follow safety instructions as told by your health care provider. You will also be told when to contact your health care provider in case of problems. This information is not intended to replace advice given to you by your health care provider. Make sure you discuss any questions you have with your health care provider. Document Revised: 04/24/2019 Document Reviewed: 11/09/2017 Elsevier Patient Education  2021 [employer]  

## 2020-10-25 DIAGNOSIS — M791 Myalgia, unspecified site: Secondary | ICD-10-CM | POA: Diagnosis not present

## 2020-10-25 DIAGNOSIS — J029 Acute pharyngitis, unspecified: Secondary | ICD-10-CM | POA: Diagnosis not present

## 2020-10-25 DIAGNOSIS — J45909 Unspecified asthma, uncomplicated: Secondary | ICD-10-CM | POA: Diagnosis not present

## 2020-10-25 DIAGNOSIS — R079 Chest pain, unspecified: Secondary | ICD-10-CM | POA: Diagnosis not present

## 2020-10-25 DIAGNOSIS — G4730 Sleep apnea, unspecified: Secondary | ICD-10-CM | POA: Diagnosis not present

## 2020-10-25 DIAGNOSIS — U071 COVID-19: Secondary | ICD-10-CM | POA: Diagnosis not present

## 2020-10-25 DIAGNOSIS — E785 Hyperlipidemia, unspecified: Secondary | ICD-10-CM | POA: Diagnosis not present

## 2020-10-25 DIAGNOSIS — Z7982 Long term (current) use of aspirin: Secondary | ICD-10-CM | POA: Diagnosis not present

## 2020-10-25 DIAGNOSIS — R0981 Nasal congestion: Secondary | ICD-10-CM | POA: Diagnosis not present

## 2020-10-25 DIAGNOSIS — E119 Type 2 diabetes mellitus without complications: Secondary | ICD-10-CM | POA: Diagnosis not present

## 2020-10-25 DIAGNOSIS — Z79899 Other long term (current) drug therapy: Secondary | ICD-10-CM | POA: Diagnosis not present

## 2020-10-26 ENCOUNTER — Other Ambulatory Visit: Payer: Self-pay | Admitting: Endocrinology

## 2020-10-27 ENCOUNTER — Other Ambulatory Visit: Payer: Self-pay | Admitting: Endocrinology

## 2020-10-27 DIAGNOSIS — J45909 Unspecified asthma, uncomplicated: Secondary | ICD-10-CM | POA: Diagnosis not present

## 2020-10-27 DIAGNOSIS — J9811 Atelectasis: Secondary | ICD-10-CM | POA: Diagnosis not present

## 2020-10-27 DIAGNOSIS — Z79899 Other long term (current) drug therapy: Secondary | ICD-10-CM | POA: Diagnosis not present

## 2020-10-27 DIAGNOSIS — Z23 Encounter for immunization: Secondary | ICD-10-CM | POA: Diagnosis not present

## 2020-10-27 DIAGNOSIS — R059 Cough, unspecified: Secondary | ICD-10-CM | POA: Diagnosis not present

## 2020-10-27 DIAGNOSIS — Z7982 Long term (current) use of aspirin: Secondary | ICD-10-CM | POA: Diagnosis not present

## 2020-10-27 DIAGNOSIS — R519 Headache, unspecified: Secondary | ICD-10-CM | POA: Diagnosis not present

## 2020-10-27 DIAGNOSIS — E785 Hyperlipidemia, unspecified: Secondary | ICD-10-CM | POA: Diagnosis not present

## 2020-10-27 DIAGNOSIS — G4730 Sleep apnea, unspecified: Secondary | ICD-10-CM | POA: Diagnosis not present

## 2020-10-27 DIAGNOSIS — R5383 Other fatigue: Secondary | ICD-10-CM | POA: Diagnosis not present

## 2020-10-27 DIAGNOSIS — J029 Acute pharyngitis, unspecified: Secondary | ICD-10-CM | POA: Diagnosis not present

## 2020-10-27 DIAGNOSIS — E119 Type 2 diabetes mellitus without complications: Secondary | ICD-10-CM | POA: Diagnosis not present

## 2020-10-27 DIAGNOSIS — U071 COVID-19: Secondary | ICD-10-CM | POA: Diagnosis not present

## 2020-10-27 DIAGNOSIS — Z880 Allergy status to penicillin: Secondary | ICD-10-CM | POA: Diagnosis not present

## 2020-10-27 DIAGNOSIS — Z881 Allergy status to other antibiotic agents status: Secondary | ICD-10-CM | POA: Diagnosis not present

## 2020-10-27 DIAGNOSIS — B349 Viral infection, unspecified: Secondary | ICD-10-CM | POA: Diagnosis not present

## 2020-10-27 DIAGNOSIS — M542 Cervicalgia: Secondary | ICD-10-CM | POA: Diagnosis not present

## 2020-10-27 DIAGNOSIS — Z794 Long term (current) use of insulin: Secondary | ICD-10-CM | POA: Diagnosis not present

## 2020-10-27 MED ORDER — NOVOLOG FLEXPEN 100 UNIT/ML ~~LOC~~ SOPN: PEN_INJECTOR | SUBCUTANEOUS | 0 refills | Status: DC

## 2020-10-28 ENCOUNTER — Telehealth: Payer: Self-pay

## 2020-10-28 ENCOUNTER — Telehealth: Payer: Self-pay | Admitting: Internal Medicine

## 2020-10-28 ENCOUNTER — Telehealth: Payer: Self-pay | Admitting: Endocrinology

## 2020-10-28 NOTE — Telephone Encounter (Signed)
I need to know prednisone dosage, and specifics about cbg's.

## 2020-10-28 NOTE — Telephone Encounter (Signed)
Message sent thru MyChart 

## 2020-10-28 NOTE — Telephone Encounter (Signed)
Patient requests to be called re: Patient spoke with Dr. Dwyane Dee last night. Patient was diagnosed with Covid 19 and put on Prednisone on 10/27/20. Patient states Dr. Dwyane Dee told Patient to call office today for further instruction from Dr. Loanne Drilling re: Patient's high blood sugars ranging from 257-600 or more. Patient has had 3 doses of Humalog since last night. Patient requests to be called at ph# [phone] to be advised.

## 2020-10-28 NOTE — Telephone Encounter (Signed)
Endocrinology Night-Client  Pt stated that she was in the Er and now BS levels are high. She stated that is was coming down but she had a steriod at the hospital and is COVID positive

## 2020-10-28 NOTE — Telephone Encounter (Signed)
The patient had a - COVID test 05/13 and + COVID test 05/14. She had been seen in the ER twice since Saturday. They put her on Prednisone and gave her Monoclonal Antibodies and told her that she can go back to work on 05/20 and was told to follow up with her provider. She states that she is feeling much better and don't know why she needs to follow up with her Provider so I have her scheduled for a virtual visit on 05/24. The patient stated that if Dr. Jerilee Hoh wants to see her sooner just let her know.

## 2020-10-28 NOTE — Telephone Encounter (Signed)
Patient is aware and appointment scheduled per patient request.

## 2020-10-28 NOTE — Telephone Encounter (Signed)
Patient left a message on the after hours line re: CHIEF COMPLAINT - "blood sugar high - 500 or greater"  "caller states she was seen in the ER and now blood sugar level is high. She stated it was coming down and that she had a steroid at the hospital and is covid positive."  Placing paper from after hours service in box. Patient ph# [phone]

## 2020-10-29 NOTE — Telephone Encounter (Signed)
Message sent thru MyChart 

## 2020-10-29 NOTE — Telephone Encounter (Signed)
please contact patient: How are blood sugar doing today?

## 2020-11-04 ENCOUNTER — Telehealth: Payer: BC Managed Care – PPO | Admitting: Internal Medicine

## 2020-11-13 DIAGNOSIS — H527 Unspecified disorder of refraction: Secondary | ICD-10-CM | POA: Diagnosis not present

## 2020-11-13 DIAGNOSIS — H47011 Ischemic optic neuropathy, right eye: Secondary | ICD-10-CM | POA: Diagnosis not present

## 2020-11-13 DIAGNOSIS — H524 Presbyopia: Secondary | ICD-10-CM | POA: Diagnosis not present

## 2020-11-13 DIAGNOSIS — H43813 Vitreous degeneration, bilateral: Secondary | ICD-10-CM | POA: Diagnosis not present

## 2020-11-13 DIAGNOSIS — H25813 Combined forms of age-related cataract, bilateral: Secondary | ICD-10-CM | POA: Diagnosis not present

## 2020-11-21 ENCOUNTER — Telehealth: Payer: Self-pay | Admitting: Endocrinology

## 2020-11-21 ENCOUNTER — Ambulatory Visit: Payer: Self-pay | Admitting: Podiatry

## 2020-11-21 ENCOUNTER — Other Ambulatory Visit: Payer: Self-pay | Admitting: *Deleted

## 2020-11-21 DIAGNOSIS — E119 Type 2 diabetes mellitus without complications: Secondary | ICD-10-CM

## 2020-11-21 NOTE — Telephone Encounter (Signed)
Pt states that medication was increased, pharmacy needs a new prescription for insulin degludec (TRESIBA FLEXTOUCH) 200 UNIT/ML FlexTouch Pen [id number] [place],  - [address].  Pt also states that, she is having eye surgery and is wanting to know if she needs to do anything before the surgery

## 2020-11-25 ENCOUNTER — Telehealth: Payer: Self-pay | Admitting: Pharmacy Technician

## 2020-11-25 ENCOUNTER — Other Ambulatory Visit: Payer: Self-pay

## 2020-11-25 DIAGNOSIS — H01003 Unspecified blepharitis right eye, unspecified eyelid: Secondary | ICD-10-CM | POA: Diagnosis not present

## 2020-11-25 DIAGNOSIS — H25813 Combined forms of age-related cataract, bilateral: Secondary | ICD-10-CM | POA: Diagnosis not present

## 2020-11-25 DIAGNOSIS — H01006 Unspecified blepharitis left eye, unspecified eyelid: Secondary | ICD-10-CM | POA: Diagnosis not present

## 2020-11-25 DIAGNOSIS — H01009 Unspecified blepharitis unspecified eye, unspecified eyelid: Secondary | ICD-10-CM | POA: Diagnosis not present

## 2020-11-25 DIAGNOSIS — H02883 Meibomian gland dysfunction of right eye, unspecified eyelid: Secondary | ICD-10-CM | POA: Diagnosis not present

## 2020-11-25 DIAGNOSIS — H02886 Meibomian gland dysfunction of left eye, unspecified eyelid: Secondary | ICD-10-CM | POA: Diagnosis not present

## 2020-11-25 DIAGNOSIS — H524 Presbyopia: Secondary | ICD-10-CM | POA: Diagnosis not present

## 2020-11-25 DIAGNOSIS — H47011 Ischemic optic neuropathy, right eye: Secondary | ICD-10-CM | POA: Diagnosis not present

## 2020-11-25 DIAGNOSIS — H04123 Dry eye syndrome of bilateral lacrimal glands: Secondary | ICD-10-CM | POA: Diagnosis not present

## 2020-11-25 NOTE — Telephone Encounter (Addendum)
Patient Advocate Encounter   Received notification from [facility] that prior authorization for TRESIBA is required.   PA submitted on 11/25/2020 [facility] Status is APPROVED    EFFECTIVE 11/25/2020 - 11/25/2021 [id number]  [facility] will continue to follow.   Venida Jarvis. Nadara Mustard, CPhT Patient Advocate [facility] Phone: [phone] Fax:  [phone]

## 2020-11-26 ENCOUNTER — Ambulatory Visit: Payer: BC Managed Care – PPO | Admitting: Internal Medicine

## 2020-11-26 ENCOUNTER — Other Ambulatory Visit: Payer: Self-pay | Admitting: Endocrinology

## 2020-11-26 DIAGNOSIS — E119 Type 2 diabetes mellitus without complications: Secondary | ICD-10-CM

## 2020-11-26 DIAGNOSIS — Z0289 Encounter for other administrative examinations: Secondary | ICD-10-CM

## 2020-11-26 MED ORDER — TRESIBA FLEXTOUCH 200 UNIT/ML ~~LOC~~ SOPN: 200.0000 [IU] | PEN_INJECTOR | Freq: Every day | SUBCUTANEOUS | 3 refills | Status: DC

## 2020-11-26 NOTE — Telephone Encounter (Signed)
Pt calling to follow up on last message. Pt is completely out   Disregard about eye surgery because it was canceled

## 2020-11-26 NOTE — Telephone Encounter (Signed)
Rx updated.

## 2020-12-25 ENCOUNTER — Encounter: Payer: Self-pay | Admitting: Family Medicine

## 2020-12-25 ENCOUNTER — Telehealth (INDEPENDENT_AMBULATORY_CARE_PROVIDER_SITE_OTHER): Payer: BC Managed Care – PPO | Admitting: Family Medicine

## 2020-12-25 DIAGNOSIS — H5789 Other specified disorders of eye and adnexa: Secondary | ICD-10-CM

## 2020-12-25 MED ORDER — ERYTHROMYCIN 5 MG/GM OP OINT: 1.0000 "application " | TOPICAL_OINTMENT | Freq: Three times a day (TID) | OPHTHALMIC | 0 refills | Status: AC

## 2020-12-25 NOTE — Patient Instructions (Signed)
   ---------------------------------------------------------------------------------------------------------------------------      WORK SLIP:  Patient Renee Lam,  08/20/1960, was seen for a medical visit today, 12/25/20 . Please excuse from work until on treatment for 24 hours and symptoms are improving.   Sincerely: E-signature: Dr. Colin Benton, DO [place] Ph: [phone]   ------------------------------------------------------------------------------------------------------------------------------  -I sent the medication(s) we discussed to your pharmacy: Meds ordered this encounter  Medications   erythromycin ophthalmic ointment    Sig: Place 1 application into both eyes 3 (three) times daily.    Dispense:  3.5 g    Refill:  0     I hope you are feeling better soon!  Seek in person care with your eye doctor or at urgent care promptly if your symptoms worsen, new concerns arise or you are not improving with treatment.  It was nice to meet you today. I help [facility] out with telemedicine visits on Tuesdays and Thursdays and am available for visits on those days. If you have any concerns or questions following this visit please schedule a follow up visit with your Primary Care doctor or seek care at a local urgent care clinic to avoid delays in care.

## 2020-12-25 NOTE — Progress Notes (Signed)
Virtual Visit via Video Note  I connected with Renee Lam  on 12/25/20 at  4:40 PM EDT by a video enabled telemedicine application and verified that I am speaking with the correct person using two identifiers.  Location patient: home, [place] Location provider:work or home office Persons participating in the virtual visit: patient, provider  I discussed the limitations of evaluation and management by telemedicine and the availability of in person appointments. The patient expressed understanding and agreed to proceed.   HPI:  Acute telemedicine visit for eye drainage: -Onset: 2 days ago -Symptoms include: itchy, irritated eyes with some clear and white drainage and a little crusty  -Denies:eye pain, HA, vision changes, redness or swelling around the eye, resp symptoms or fevers  - has been working closely with a patient with an eye infection -she sees an eye doctor for dry eyes -Pertinent past medical history:see below -Pertinent medication allergies:  Allergies  Allergen Reactions   Metformin And Related Nausea And Vomiting   Food     Strawberries, mangoes, pineapple, eggplant, zucchini, macadamia nuts    Penicillins Hives       Zithromax [Azithromycin] Hives    z-max; states can take Z pack  -patient reports she can take Azithromycin fine and has a number of times, only had skin reaction when did z/max once but has tolerated zpack since fine.   ROS: See pertinent positives and negatives per HPI.  Past Medical History:  Diagnosis Date   Allergy    Arthritis    Asthma    Chicken pox    Diabetes mellitus    Finger infection 2008   isolation   GERD (gastroesophageal reflux disease)    Heart murmur    benign per patient   Hyperlipidemia    Hypertension    past hx    Ischemic optic neuropathy    right eye   Migraine    OSA (obstructive sleep apnea) 10/25/2016   Osteoporosis    Pericarditis    Right sided sciatica 06/13/2017   Sleep apnea    Patient is scheduled for sleep  study tonight- wears cpap   Viral meningitis     Past Surgical History:  Procedure Laterality Date   CARDIAC CATHETERIZATION  2012   [place], 2000   COLONOSCOPY     KNEE ARTHROSCOPY Right 2010   MENISCUS REPAIR Left 10/2017   ORIF HUMERUS FRACTURE Right 03/25/2015   Procedure: OPEN REDUCTION INTERNAL FIXATION (ORIF) DISTAL HUMERUS FRACTURE;  Surgeon: Renette Butters, MD;  Location: [place];  Service: Orthopedics;  Laterality: Right;   POLYPECTOMY       Current Outpatient Medications:    albuterol (PROAIR HFA) 108 (90 Base) MCG/ACT inhaler, Inhale 2 puffs into the lungs as needed., Disp: 18 g, Rfl: 3   Aspirin-Acetaminophen-Caffeine (EXCEDRIN EXTRA STRENGTH PO), Take by mouth as needed., Disp: , Rfl:    atorvastatin (LIPITOR) 80 MG tablet, TAKE 1 TABLET BY MOUTH EVERYDAY AT BEDTIME, Disp: 90 tablet, Rfl: 4   BD PEN NEEDLE NANO 2ND GEN 32G X 4 MM MISC, USE AS DIRECTED 4 TIMES A DAY, Disp: 100 each, Rfl: 2   Continuous Blood Gluc Receiver (DEXCOM G6 RECEIVER) DEVI, 1 EACH BY DOES NOT APPLY ROUTE SEE ADMIN INSTRUCTIONS. FOR CONTINUOUS GLUCOSE MONITORING E11.9, Disp: 1 each, Rfl: 0   Continuous Blood Gluc Sensor (DEXCOM G6 SENSOR) MISC, USE TO MONITOR BLOOD SUGAR AS DIRECTED, CHANGE SENSOR EVERY 10 DAYS, Disp: 3 each, Rfl: 3  Continuous Blood Gluc Transmit (DEXCOM G6 TRANSMITTER) MISC, 1 EACH BY DOES NOT APPLY ROUTE SEE ADMIN INSTRUCTIONS. FOR CONTINUOUS GLUCOSE MONITORING E11.9, Disp: 1 each, Rfl: 2   dapagliflozin propanediol (FARXIGA) 10 MG TABS tablet, Take 1 tablet (10 mg total) by mouth daily., Disp: 90 tablet, Rfl: 3   diphenhydrAMINE (BENADRYL) 25 MG tablet, Take 25 mg by mouth every 6 (six) hours as needed for allergies. , Disp: , Rfl:    EPINEPHrine 0.3 mg/0.3 mL IJ SOAJ injection, INJECT 0.3 ML INTO THE MUSCLE ONCE, Disp: 2 mL, Rfl: 0   erythromycin ophthalmic ointment, Place 1 application into both eyes 3 (three) times daily., Disp: 3.5 g,  Rfl: 0   ezetimibe (ZETIA) 10 MG tablet, Take 1 tablet (10 mg total) by mouth daily., Disp: 90 tablet, Rfl: 1   Fluticasone-Salmeterol (ADVAIR DISKUS) 250-50 MCG/DOSE AEPB, Inhale 1 puff into the lungs 2 (two) times daily., Disp: 60 each, Rfl: 5   hydrocortisone 2.5 % cream, Apply topically every evening. (Patient taking differently: Apply topically as needed.), Disp: 30 g, Rfl: 1   insulin aspart (NOVOLOG FLEXPEN) 100 UNIT/ML FlexPen, Inject 50 units, four times daily until sugar<250, Disp: 6 mL, Rfl: 0   insulin degludec (TRESIBA FLEXTOUCH) 200 UNIT/ML FlexTouch Pen, Inject 200 Units into the skin daily., Disp: 66 mL, Rfl: 3   liraglutide (VICTOZA) 18 MG/3ML SOPN, INJECT 1.8 MG UNDER THE SKIN ONCE DAILY, Disp: 9 mL, Rfl: 8   montelukast (SINGULAIR) 10 MG tablet, TAKE 1 TABLET BY MOUTH EVERYDAY AT BEDTIME, Disp: 90 tablet, Rfl: 2   ondansetron (ZOFRAN-ODT) 4 MG disintegrating tablet, Take 4 mg by mouth every 8 (eight) hours as needed., Disp: , Rfl:   EXAM:  VITALS per patient if applicable:  GENERAL: alert, oriented, appears well and in no acute distress  HEENT: atraumatic, conjunttiva w/ mild erythema, poor video quality so eye exam limited but does not appear to have edema/erythema/drainage of the eyes currently,  no obvious abnormalities on inspection of external nose and ears  NECK: normal movements of the head and neck  LUNGS: on inspection no signs of respiratory distress, breathing rate appears normal, no obvious gross SOB, gasping or wheezing  CV: no obvious cyanosis  MS: moves all visible extremities without noticeable abnormality  PSYCH/NEURO: pleasant and cooperative, no obvious depression or anxiety, speech and thought processing grossly intact  ASSESSMENT AND PLAN:  Discussed the following assessment and plan:  Eye irritation  -we discussed possible serious and likely etiologies, options for evaluation and workup, limitations of telemedicine visit vs in person visit,  treatment, treatment risks and precautions. Pt prefers to treat via telemedicine empirically rather than in person at this moment. Query conjunctivitis vs other. Opted for compresses, erythro ointment (she assures me is not allergic to azithromycin when taken as zpack) and close follow up inperson if not resolving with treatment.  Work/School slipped offered: provided in patient instructions   Advised to seek prompt in person care if worsening, new symptoms arise, or if is not improving with treatment. Discussed options for inperson care if PCP office not available. Did let this patient know that I only do telemedicine on Tuesdays and Thursdays for [place]. Advised to schedule follow up visit with PCP or [facility] if any further questions or concerns to avoid delays in care.   I discussed the assessment and treatment plan with the patient. The patient was provided an opportunity to ask questions and all were answered. The patient agreed with the plan and demonstrated  an understanding of the instructions.     Lucretia Kern, DO

## 2021-01-02 DIAGNOSIS — H47011 Ischemic optic neuropathy, right eye: Secondary | ICD-10-CM | POA: Diagnosis not present

## 2021-01-02 DIAGNOSIS — E559 Vitamin D deficiency, unspecified: Secondary | ICD-10-CM | POA: Diagnosis not present

## 2021-01-02 DIAGNOSIS — G4733 Obstructive sleep apnea (adult) (pediatric): Secondary | ICD-10-CM | POA: Diagnosis not present

## 2021-01-24 ENCOUNTER — Other Ambulatory Visit: Payer: Self-pay | Admitting: Endocrinology

## 2021-02-19 DIAGNOSIS — R448 Other symptoms and signs involving general sensations and perceptions: Secondary | ICD-10-CM | POA: Diagnosis not present

## 2021-02-19 DIAGNOSIS — R200 Anesthesia of skin: Secondary | ICD-10-CM | POA: Diagnosis not present

## 2021-02-19 DIAGNOSIS — H539 Unspecified visual disturbance: Secondary | ICD-10-CM | POA: Diagnosis not present

## 2021-02-19 DIAGNOSIS — R29818 Other symptoms and signs involving the nervous system: Secondary | ICD-10-CM | POA: Diagnosis not present

## 2021-02-19 DIAGNOSIS — E538 Deficiency of other specified B group vitamins: Secondary | ICD-10-CM | POA: Diagnosis not present

## 2021-02-19 DIAGNOSIS — R202 Paresthesia of skin: Secondary | ICD-10-CM | POA: Diagnosis not present

## 2021-02-20 ENCOUNTER — Other Ambulatory Visit: Payer: Self-pay

## 2021-02-20 ENCOUNTER — Ambulatory Visit: Payer: BC Managed Care – PPO | Admitting: Endocrinology

## 2021-02-20 VITALS — BP 130/80 | HR 82 | Ht 59.0 in | Wt 180.2 lb

## 2021-02-20 DIAGNOSIS — E119 Type 2 diabetes mellitus without complications: Secondary | ICD-10-CM | POA: Diagnosis not present

## 2021-02-20 LAB — POCT GLYCOSYLATED HEMOGLOBIN (HGB A1C): Hemoglobin A1C: 6.3 % — AB (ref 4.0–5.6)

## 2021-02-20 MED ORDER — TRULICITY 4.5 MG/0.5ML ~~LOC~~ SOAJ: 4.5000 mg | SUBCUTANEOUS | 3 refills | Status: DC

## 2021-02-20 MED ORDER — TRESIBA FLEXTOUCH 200 UNIT/ML ~~LOC~~ SOPN: 170.0000 [IU] | PEN_INJECTOR | Freq: Every day | SUBCUTANEOUS | 3 refills | Status: AC

## 2021-02-20 NOTE — Patient Instructions (Addendum)
I have sent a prescription to your pharmacy:, to  reduce the Tresiba to 170 units per day, and to change Victoza to Trulicity.   Please continue the same other diabetes medications.   check your blood sugar once a day.  vary the time of day when you check, between before the 3 meals, and at bedtime.  also check if you have symptoms of your blood sugar being too high or too low.  please keep a record of the readings and bring it to your next appointment here (or you can bring the meter itself).  You can write it on any piece of paper.  please call us sooner if your blood sugar goes below 70, or if you have a lot of readings over 200.  Please come back for a follow-up appointment in January.

## 2021-02-20 NOTE — Progress Notes (Signed)
Subjective:    Patient ID: Renee Lam, female    DOB: [birth year], 60 y.o.   MRN: [id number]  HPI Pt returns for f/u of diabetes mellitus: DM type: Insulin-requiring type 2.  Dx'ed: [id number] Complications: DR.  Therapy: insulin since 2010, Farxiga, and Victoza.  GDM: 1996 and 1999.  DKA: never.  Severe hypoglycemia: never.     Pancreatitis: never.   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   Other: she takes QD insulin, after poor results with multiple daily injections; she did not tolerate metformin (2013-2017--nausea); She says cbg on work days=days off.  She declines pump for now; she had vaginitis on farxiga, but she decides to continue. Interval history:   She takes meds as rx'ed.  I reviewed continuous glucose monitor data. Glucose varies from 70-235.  It decreases overnight.  It is in general highest at [place] and 10PM. Past Medical History:  Diagnosis Date   Allergy    Arthritis    Asthma    Chicken pox    Diabetes mellitus    Finger infection 2008   isolation   GERD (gastroesophageal reflux disease)    Heart murmur    benign per patient   Hyperlipidemia    Hypertension    past hx    Ischemic optic neuropathy    right eye   Migraine    OSA (obstructive sleep apnea) 10/25/2016   Osteoporosis    Pericarditis    Right sided sciatica 06/13/2017   Sleep apnea    Patient is scheduled for sleep study tonight- wears cpap   Viral meningitis     Past Surgical History:  Procedure Laterality Date   CARDIAC CATHETERIZATION  2012   [place], 2000   COLONOSCOPY     KNEE ARTHROSCOPY Right 2010   MENISCUS REPAIR Left 10/2017   ORIF HUMERUS FRACTURE Right 03/25/2015   Procedure: OPEN REDUCTION INTERNAL FIXATION (ORIF) DISTAL HUMERUS FRACTURE;  Surgeon: Renette Butters, MD;  Location: [facility];  Service: Orthopedics;  Laterality: Right;   POLYPECTOMY      Social History   Socioeconomic History   Marital status: Divorced    Spouse name:  Not on file   Number of children: Not on file   Years of education: Not on file   Highest education level: Not on file  Occupational History   Not on file  Tobacco Use   Smoking status: Never   Smokeless tobacco: Never  Vaping Use   Vaping Use: Never used  Substance and Sexual Activity   Alcohol use: Yes    Comment: once a year   Drug use: No   Sexual activity: Yes    Birth control/protection: Post-menopausal  Other Topics Concern   Not on file  Social History Narrative   Not on file   Social Determinants of Health   Financial Resource Strain: Not on file  Food Insecurity: Not on file  Transportation Needs: Not on file  Physical Activity: Not on file  Stress: Not on file  Social Connections: Not on file  Intimate Partner Violence: Not on file    Current Outpatient Medications on File Prior to Visit  Medication Sig Dispense Refill   albuterol (PROAIR HFA) 108 (90 Base) MCG/ACT inhaler Inhale 2 puffs into the lungs as needed. 18 g 3   Aspirin-Acetaminophen-Caffeine (EXCEDRIN EXTRA STRENGTH PO) Take by mouth as needed.     atorvastatin (LIPITOR) 80 MG tablet TAKE 1 TABLET BY  MOUTH EVERYDAY AT BEDTIME 90 tablet 4   BD PEN NEEDLE NANO 2ND GEN 32G X 4 MM MISC USE AS DIRECTED 4 TIMES A DAY 100 each 2   Continuous Blood Gluc Receiver (DEXCOM G6 RECEIVER) DEVI 1 EACH BY DOES NOT APPLY ROUTE SEE ADMIN INSTRUCTIONS. FOR CONTINUOUS GLUCOSE MONITORING E11.9 1 each 0   Continuous Blood Gluc Sensor (DEXCOM G6 SENSOR) MISC USE TO MONITOR BLOOD SUGAR AS DIRECTED, CHANGE SENSOR EVERY 10 DAYS 3 each 3   Continuous Blood Gluc Transmit (DEXCOM G6 TRANSMITTER) MISC 1 EACH BY DOES NOT APPLY ROUTE SEE ADMIN INSTRUCTIONS. FOR CONTINUOUS GLUCOSE MONITORING E11.9 1 each 2   dapagliflozin propanediol (FARXIGA) 10 MG TABS tablet Take 1 tablet (10 mg total) by mouth daily. 90 tablet 3   diphenhydrAMINE (BENADRYL) 25 MG tablet Take 25 mg by mouth every 6 (six) hours as needed for allergies.       EPINEPHrine 0.3 mg/0.3 mL IJ SOAJ injection INJECT 0.3 ML INTO THE MUSCLE ONCE 2 mL 0   erythromycin ophthalmic ointment Place 1 application into both eyes 3 (three) times daily. 3.5 g 0   ezetimibe (ZETIA) 10 MG tablet Take 1 tablet (10 mg total) by mouth daily. 90 tablet 1   Fluticasone-Salmeterol (ADVAIR DISKUS) 250-50 MCG/DOSE AEPB Inhale 1 puff into the lungs 2 (two) times daily. 60 each 5   hydrocortisone 2.5 % cream Apply topically every evening. (Patient taking differently: Apply topically as needed.) 30 g 1   montelukast (SINGULAIR) 10 MG tablet TAKE 1 TABLET BY MOUTH EVERYDAY AT BEDTIME 90 tablet 2   ondansetron (ZOFRAN-ODT) 4 MG disintegrating tablet Take 4 mg by mouth every 8 (eight) hours as needed.     No current facility-administered medications on file prior to visit.     Family History  Problem Relation Age of Onset   Colon polyps Mother    Diabetes Mother    Heart failure Mother    Peripheral vascular disease Mother    Diabetes Father    Heart disease Father    Arthritis Father    Hypertension Father    Heart failure Father    Prostate cancer Father    Arthritis Other    Hyperlipidemia Other    Heart disease Other    Hypertension Other    Depression Other    Ovarian cancer Other    Uterine cancer Other    Breast cancer Other    Prostate cancer Other    Breast cancer Cousin    Colon cancer Neg Hx    Stomach cancer Neg Hx    Rectal cancer Neg Hx    Pancreatic cancer Neg Hx    Esophageal cancer Neg Hx     BP 130/80 (BP Location: Right Arm, Patient Position: Sitting, Cuff Size: Normal)   Pulse 82   Ht '4\' 11"'$  (1.499 m)   Wt 180 lb 3.2 oz (81.7 kg)   SpO2 98%   BMI 36.40 kg/m    Review of Systems     Objective:   Physical Exam Pulses: dorsalis pedis intact bilat.   MSK: no deformity of the feet CV: trace bilat leg edema.   Skin:  no ulcer on the feet.  normal color and temp on the feet.  Neuro: sensation is intact to touch on the feet.    Lab  Results  Component Value Date   HGBA1C 6.3 (A) 02/20/2021       Assessment & Plan:  Insulin-requiring type 2 DM: overcontrolled Obesity: uncontrolled  Patient Instructions  I have sent a prescription to your pharmacy:, to  reduce the Tresiba to 170 units per day, and to change Victoza to Trulicity.   Please continue the same other diabetes medications.   check your blood sugar once a day.  vary the time of day when you check, between before the 3 meals, and at bedtime.  also check if you have symptoms of your blood sugar being too high or too low.  please keep a record of the readings and bring it to your next appointment here (or you can bring the meter itself).  You can write it on any piece of paper.  please call us sooner if your blood sugar goes below 70, or if you have a lot of readings over 200.  Please come back for a follow-up appointment in January.  '

## 2021-02-23 ENCOUNTER — Encounter: Payer: Self-pay | Admitting: Endocrinology

## 2021-02-23 DIAGNOSIS — H47011 Ischemic optic neuropathy, right eye: Secondary | ICD-10-CM | POA: Diagnosis not present

## 2021-02-23 DIAGNOSIS — H02883 Meibomian gland dysfunction of right eye, unspecified eyelid: Secondary | ICD-10-CM | POA: Diagnosis not present

## 2021-02-23 DIAGNOSIS — H01009 Unspecified blepharitis unspecified eye, unspecified eyelid: Secondary | ICD-10-CM | POA: Diagnosis not present

## 2021-02-23 DIAGNOSIS — H25813 Combined forms of age-related cataract, bilateral: Secondary | ICD-10-CM | POA: Diagnosis not present

## 2021-02-24 ENCOUNTER — Other Ambulatory Visit: Payer: Self-pay | Admitting: Internal Medicine

## 2021-02-24 DIAGNOSIS — E7800 Pure hypercholesterolemia, unspecified: Secondary | ICD-10-CM

## 2021-03-10 ENCOUNTER — Telehealth: Payer: Self-pay | Admitting: Endocrinology

## 2021-03-10 NOTE — Telephone Encounter (Signed)
Pt has been having a lot of low readings throughout the day. Works new position and it doesn't give her time to eat, causing her to drink emergency orange juice and snacks. Please give patient a call: [phone]

## 2021-03-10 NOTE — Telephone Encounter (Signed)
Pt calling in, states she has been seeing orbs in her eyes since yesterday 9/26. Needs advice on what to do next. Pt contact. [phone]

## 2021-03-11 ENCOUNTER — Telehealth: Payer: Self-pay | Admitting: Endocrinology

## 2021-03-11 NOTE — Telephone Encounter (Signed)
Message sent thru MyChart 

## 2021-03-11 NOTE — Telephone Encounter (Signed)
FOLLOWING UP ON PHONE CALLS MADE ON 9/27. Please call pt [phone]

## 2021-03-19 NOTE — Telephone Encounter (Signed)
NA

## 2021-03-26 ENCOUNTER — Other Ambulatory Visit: Payer: Self-pay | Admitting: Endocrinology

## 2021-03-26 DIAGNOSIS — E119 Type 2 diabetes mellitus without complications: Secondary | ICD-10-CM

## 2021-04-02 DIAGNOSIS — E1142 Type 2 diabetes mellitus with diabetic polyneuropathy: Secondary | ICD-10-CM | POA: Diagnosis not present

## 2021-04-02 DIAGNOSIS — M79672 Pain in left foot: Secondary | ICD-10-CM | POA: Diagnosis not present

## 2021-04-02 DIAGNOSIS — M79671 Pain in right foot: Secondary | ICD-10-CM | POA: Diagnosis not present

## 2021-04-07 ENCOUNTER — Other Ambulatory Visit: Payer: Self-pay

## 2021-04-07 ENCOUNTER — Telehealth: Payer: Self-pay | Admitting: Endocrinology

## 2021-04-07 DIAGNOSIS — E119 Type 2 diabetes mellitus without complications: Secondary | ICD-10-CM

## 2021-04-07 MED ORDER — TRULICITY 4.5 MG/0.5ML ~~LOC~~ SOPN
4.5000 mg | PEN_INJECTOR | SUBCUTANEOUS | 3 refills | Status: AC
Start: 2021-04-07 — End: ?

## 2021-04-07 NOTE — Telephone Encounter (Signed)
Spoke with pt to have her to contact another pharmacy to see if they have It and let us know

## 2021-04-07 NOTE — Telephone Encounter (Signed)
Patient called back with the following information: [facility] on [address] in [place], [place] Ph# [phone]

## 2021-04-07 NOTE — Telephone Encounter (Signed)
Patient states [facility] cannot [facility] prescription for Dulaglutide (TRULICITY) 4.5 NV/9.61YO SOPN [[id number]] they do not have this medication asking for a call from the nurse to advise on how to proceed please do not reply on my chart please call the patient directly

## 2021-04-07 NOTE — Telephone Encounter (Signed)
Rx sent 

## 2021-04-30 ENCOUNTER — Other Ambulatory Visit: Payer: Self-pay | Admitting: Endocrinology

## 2021-04-30 DIAGNOSIS — E119 Type 2 diabetes mellitus without complications: Secondary | ICD-10-CM

## 2021-05-08 DIAGNOSIS — Z7689 Persons encountering health services in other specified circumstances: Secondary | ICD-10-CM | POA: Diagnosis not present

## 2021-05-08 DIAGNOSIS — E1142 Type 2 diabetes mellitus with diabetic polyneuropathy: Secondary | ICD-10-CM | POA: Diagnosis not present

## 2021-05-08 DIAGNOSIS — M79672 Pain in left foot: Secondary | ICD-10-CM | POA: Diagnosis not present

## 2021-05-08 DIAGNOSIS — M79671 Pain in right foot: Secondary | ICD-10-CM | POA: Diagnosis not present

## 2021-06-10 ENCOUNTER — Other Ambulatory Visit: Payer: Self-pay | Admitting: Endocrinology

## 2021-06-26 ENCOUNTER — Ambulatory Visit: Payer: BC Managed Care – PPO | Admitting: Endocrinology

## 2021-06-29 ENCOUNTER — Other Ambulatory Visit: Payer: Self-pay | Admitting: Endocrinology

## 2021-07-15 ENCOUNTER — Other Ambulatory Visit (HOSPITAL_COMMUNITY): Payer: Self-pay

## 2021-08-01 ENCOUNTER — Other Ambulatory Visit: Payer: Self-pay | Admitting: Endocrinology

## 2021-08-01 DIAGNOSIS — E119 Type 2 diabetes mellitus without complications: Secondary | ICD-10-CM

## 2021-08-11 ENCOUNTER — Other Ambulatory Visit: Payer: Self-pay | Admitting: Internal Medicine

## 2021-08-11 DIAGNOSIS — E7800 Pure hypercholesterolemia, unspecified: Secondary | ICD-10-CM

## 2021-08-12 ENCOUNTER — Other Ambulatory Visit (HOSPITAL_COMMUNITY): Payer: Self-pay

## 2021-09-29 ENCOUNTER — Telehealth: Payer: Self-pay

## 2021-09-29 ENCOUNTER — Other Ambulatory Visit (HOSPITAL_COMMUNITY): Payer: Self-pay

## 2021-09-29 NOTE — Telephone Encounter (Signed)
Patient Advocate Encounter ? ?Prior Authorization for Trulicity 4.'5mg'$ /0.39m pen injectors has been approved.   ? ?PA#  [id number]? ?Effective dates: 09/29/21 through 09/30/22 ? ?Refill too soon. ? ?Patient Advocate ?Fax: [phone] ?

## 2021-09-29 NOTE — Telephone Encounter (Signed)
Patient Advocate Encounter ?  ?Received notification from [facility] that prior authorization for Trulicity 4.'5mg'$ /0.64m pen injectors is required by his/her insurance [employer]. ?  ?PA submitted on 09/29/21 ? ?Key#: [id number]? ?Status is pending ?   ?[facility] will continue to follow: ? ?Patient Advocate ?Fax: [phone] ?

## 2021-10-02 ENCOUNTER — Telehealth: Payer: Self-pay | Admitting: Pharmacy Technician

## 2021-10-02 NOTE — Telephone Encounter (Signed)
Patient Advocate Encounter ? ?Received notification from [place] that prior authorization for TRESIBA 200U is required. ?  ?PA submitted on 4.21.23 ?Key [id number] ?Status is pending ?  ?[facility] will continue to follow ? ?Jacori Mulrooney R Kathlen Sakurai, CPhT ?Patient Advocate ?[facility] ?Phone: [phone] ?Fax:  [phone] ? ?

## 2021-10-08 ENCOUNTER — Other Ambulatory Visit (HOSPITAL_COMMUNITY): Payer: Self-pay

## 2021-10-14 ENCOUNTER — Other Ambulatory Visit (HOSPITAL_COMMUNITY): Payer: Self-pay

## 2021-10-14 NOTE — Telephone Encounter (Signed)
Patient Advocate Encounter ? ?Prior Authorization for TRESIBA 200U  has been approved.   ? ?PA#  [id number] ?Effective dates: 10/08/2021 through 10/09/2022 ? ? ? ? ?Lady Deutscher, CPhT-Adv ?Pharmacy Patient Advocate Specialist ?[facility] Patient Advocate Team ?Direct Number: [phone]  Fax: [phone] ? ?

## 2022-02-22 ENCOUNTER — Telehealth: Payer: Self-pay | Admitting: Pharmacy Technician

## 2022-02-22 ENCOUNTER — Other Ambulatory Visit (HOSPITAL_COMMUNITY): Payer: Self-pay

## 2022-02-22 NOTE — Telephone Encounter (Signed)
Per CoverMyMeds, pt needs a new PA for [place]. Per test claim, no PA needed right now.

## 2022-11-03 ENCOUNTER — Other Ambulatory Visit (HOSPITAL_COMMUNITY): Payer: Self-pay

## 2022-12-08 ENCOUNTER — Encounter: Payer: Self-pay | Admitting: Internal Medicine
